# Patient Record
Sex: Female | Born: 1980
Health system: Southern US, Community
[De-identification: ages and names within clinical notes are randomized; demographics above are authoritative.]

## PROBLEM LIST (undated history)

## (undated) DIAGNOSIS — E785 Hyperlipidemia, unspecified: Secondary | ICD-10-CM

## (undated) DIAGNOSIS — E079 Disorder of thyroid, unspecified: Secondary | ICD-10-CM

## (undated) DIAGNOSIS — K219 Gastro-esophageal reflux disease without esophagitis: Secondary | ICD-10-CM

## (undated) DIAGNOSIS — N915 Oligomenorrhea, unspecified: Secondary | ICD-10-CM

## (undated) DIAGNOSIS — K5900 Constipation, unspecified: Secondary | ICD-10-CM

## (undated) DIAGNOSIS — F32A Depression, unspecified: Secondary | ICD-10-CM

## (undated) DIAGNOSIS — Z1231 Encounter for screening mammogram for malignant neoplasm of breast: Secondary | ICD-10-CM

## (undated) DIAGNOSIS — Z9221 Personal history of antineoplastic chemotherapy: Secondary | ICD-10-CM

## (undated) DIAGNOSIS — G43909 Migraine, unspecified, not intractable, without status migrainosus: Secondary | ICD-10-CM

## (undated) DIAGNOSIS — E669 Obesity, unspecified: Secondary | ICD-10-CM

## (undated) DIAGNOSIS — F419 Anxiety disorder, unspecified: Secondary | ICD-10-CM

## (undated) DIAGNOSIS — C8190 Hodgkin lymphoma, unspecified, unspecified site: Secondary | ICD-10-CM

## (undated) DIAGNOSIS — E559 Vitamin D deficiency, unspecified: Secondary | ICD-10-CM

## (undated) DIAGNOSIS — L219 Seborrheic dermatitis, unspecified: Secondary | ICD-10-CM

## (undated) DIAGNOSIS — N979 Female infertility, unspecified: Secondary | ICD-10-CM

## (undated) DIAGNOSIS — L509 Urticaria, unspecified: Secondary | ICD-10-CM

## (undated) DIAGNOSIS — C819A Hodgkin lymphoma, unspecified, in remission: Secondary | ICD-10-CM

## (undated) DIAGNOSIS — F329 Major depressive disorder, single episode, unspecified: Secondary | ICD-10-CM

## (undated) DIAGNOSIS — R252 Cramp and spasm: Secondary | ICD-10-CM

## (undated) DIAGNOSIS — S0300XA Dislocation of jaw, unspecified side, initial encounter: Secondary | ICD-10-CM

## (undated) DIAGNOSIS — R0683 Snoring: Secondary | ICD-10-CM

## (undated) DIAGNOSIS — Z923 Personal history of irradiation: Secondary | ICD-10-CM

## (undated) DIAGNOSIS — G4700 Insomnia, unspecified: Secondary | ICD-10-CM

## (undated) HISTORY — DX: Hodgkin lymphoma, unspecified, unspecified site: C81.90

## (undated) HISTORY — DX: Disorder of thyroid, unspecified: E07.9

## (undated) HISTORY — DX: Hyperlipidemia, unspecified: E78.5

## (undated) HISTORY — DX: Female infertility, unspecified: N97.9

## (undated) HISTORY — DX: Seborrheic dermatitis, unspecified: L21.9

## (undated) HISTORY — DX: Obesity, unspecified: E66.9

## (undated) HISTORY — DX: Dislocation of jaw, unspecified side, initial encounter: S03.00XA

## (undated) HISTORY — DX: Gastro-esophageal reflux disease without esophagitis: K21.9

## (undated) HISTORY — DX: Insomnia, unspecified: G47.00

## (undated) HISTORY — DX: Vitamin D deficiency, unspecified: E55.9

## (undated) HISTORY — DX: Constipation, unspecified: K59.00

## (undated) HISTORY — DX: Cramp and spasm: R25.2

## (undated) HISTORY — DX: Urticaria, unspecified: L50.9

## (undated) HISTORY — PX: THYROID SURGERY: SHX805

## (undated) HISTORY — DX: Migraine, unspecified, not intractable, without status migrainosus: G43.909

## (undated) HISTORY — DX: Depression, unspecified: F32.A

## (undated) HISTORY — DX: Anxiety disorder, unspecified: F41.9

## (undated) HISTORY — DX: Snoring: R06.83

## (undated) HISTORY — DX: Encounter for screening mammogram for malignant neoplasm of breast: Z12.31

## (undated) HISTORY — DX: Major depressive disorder, single episode, unspecified: F32.9

## (undated) HISTORY — PX: OTHER SURGICAL HISTORY: SHX169

## (undated) HISTORY — DX: Oligomenorrhea, unspecified: N91.5

## (undated) HISTORY — DX: Hodgkin lymphoma, unspecified, in remission: C81.9A

---

## 2002-02-11 HISTORY — PX: PORT-A-CATH REMOVAL: SHX5289

## 2004-03-19 ENCOUNTER — Ambulatory Visit: Payer: Self-pay | Admitting: Oncology

## 2004-04-11 ENCOUNTER — Ambulatory Visit: Payer: Self-pay | Admitting: Oncology

## 2005-04-18 ENCOUNTER — Ambulatory Visit: Payer: Self-pay | Admitting: Oncology

## 2005-05-12 ENCOUNTER — Ambulatory Visit: Payer: Self-pay | Admitting: Oncology

## 2005-08-26 ENCOUNTER — Ambulatory Visit: Payer: Self-pay | Admitting: Oncology

## 2005-09-11 ENCOUNTER — Ambulatory Visit: Payer: Self-pay | Admitting: Oncology

## 2005-09-27 ENCOUNTER — Ambulatory Visit: Payer: Self-pay | Admitting: Oncology

## 2005-10-08 ENCOUNTER — Emergency Department: Payer: Self-pay | Admitting: Emergency Medicine

## 2005-12-24 ENCOUNTER — Ambulatory Visit: Payer: Self-pay | Admitting: Oncology

## 2006-01-21 ENCOUNTER — Ambulatory Visit: Payer: Self-pay | Admitting: Oncology

## 2006-02-11 ENCOUNTER — Ambulatory Visit: Payer: Self-pay | Admitting: Oncology

## 2006-04-29 ENCOUNTER — Emergency Department: Payer: Self-pay | Admitting: Emergency Medicine

## 2006-05-07 ENCOUNTER — Emergency Department: Payer: Self-pay | Admitting: Emergency Medicine

## 2006-05-08 ENCOUNTER — Ambulatory Visit: Payer: Self-pay | Admitting: Emergency Medicine

## 2006-09-12 ENCOUNTER — Ambulatory Visit: Payer: Self-pay | Admitting: Oncology

## 2006-09-29 ENCOUNTER — Ambulatory Visit: Payer: Self-pay | Admitting: Oncology

## 2006-10-13 ENCOUNTER — Ambulatory Visit: Payer: Self-pay | Admitting: Oncology

## 2007-03-21 ENCOUNTER — Emergency Department: Payer: Self-pay | Admitting: Emergency Medicine

## 2007-06-05 ENCOUNTER — Emergency Department: Payer: Self-pay | Admitting: Emergency Medicine

## 2007-06-05 ENCOUNTER — Other Ambulatory Visit: Payer: Self-pay

## 2007-09-12 ENCOUNTER — Ambulatory Visit: Payer: Self-pay | Admitting: Oncology

## 2007-09-24 ENCOUNTER — Emergency Department: Payer: Self-pay | Admitting: Emergency Medicine

## 2008-10-18 ENCOUNTER — Ambulatory Visit: Payer: Self-pay | Admitting: Family Medicine

## 2010-03-29 ENCOUNTER — Ambulatory Visit: Payer: Self-pay | Admitting: Family Medicine

## 2010-03-30 ENCOUNTER — Ambulatory Visit: Payer: Self-pay | Admitting: Family Medicine

## 2010-08-12 HISTORY — PX: OTHER SURGICAL HISTORY: SHX169

## 2010-10-28 ENCOUNTER — Ambulatory Visit: Payer: Self-pay | Admitting: Family Medicine

## 2010-11-28 ENCOUNTER — Ambulatory Visit: Payer: Self-pay | Admitting: Family Medicine

## 2012-09-30 ENCOUNTER — Ambulatory Visit: Payer: Self-pay | Admitting: Oncology

## 2012-09-30 LAB — HCG, QUANTITATIVE, PREGNANCY: Beta Hcg, Quant.: <1 m[IU]/mL — ABNORMAL LOW

## 2012-10-06 ENCOUNTER — Ambulatory Visit: Payer: Self-pay | Admitting: Unknown Physician Specialty

## 2012-10-12 ENCOUNTER — Ambulatory Visit: Payer: Self-pay | Admitting: Oncology

## 2012-10-13 ENCOUNTER — Ambulatory Visit: Payer: Self-pay | Admitting: Unknown Physician Specialty

## 2012-10-13 DIAGNOSIS — E890 Postprocedural hypothyroidism: Secondary | ICD-10-CM | POA: Insufficient documentation

## 2012-10-13 HISTORY — PX: THYROIDECTOMY: SHX17

## 2012-10-13 LAB — CALCIUM: Calcium, Total: 8.4 mg/dL — ABNORMAL LOW (ref 8.5–10.1)

## 2012-10-14 LAB — CALCIUM: Calcium, Total: 7.8 mg/dL — ABNORMAL LOW (ref 8.5–10.1)

## 2012-11-05 LAB — PATHOLOGY REPORT

## 2012-11-11 ENCOUNTER — Ambulatory Visit: Payer: Self-pay | Admitting: Oncology

## 2012-11-19 ENCOUNTER — Ambulatory Visit (INDEPENDENT_AMBULATORY_CARE_PROVIDER_SITE_OTHER): Payer: 59 | Admitting: General Surgery

## 2012-11-19 ENCOUNTER — Telehealth: Payer: Self-pay

## 2012-11-19 ENCOUNTER — Encounter: Payer: Self-pay | Admitting: General Surgery

## 2012-11-19 VITALS — BP 134/74 | HR 80 | Resp 14 | Ht 66.0 in | Wt 273.0 lb

## 2012-11-19 DIAGNOSIS — C8190 Hodgkin lymphoma, unspecified, unspecified site: Secondary | ICD-10-CM

## 2012-11-19 NOTE — Telephone Encounter (Signed)
Patient is scheduled for a venous port placement to be done by Dr Evette Cristal and also a bone marrow biopsy to be completed by Dr Doylene Canning on 11/24/12 at 7:30 am @ [facility]. Patient given instructions and OR date. Hayley in Dr Aleda Grana office notified of OR time and date and will be contacting OR scheduling to coordinate Dr Aleda Grana biopsy to be done prior to port placement.

## 2012-11-19 NOTE — Addendum Note (Signed)
Addended by: Kieth Brightly on: 11/19/2012 05:13 PM   Modules accepted: Orders

## 2012-11-19 NOTE — Progress Notes (Signed)
Patient ID: Jo [name], female   DOB: 05/04/1980, 32 y.o.   MRN: [id number]  Chief Complaint  Patient presents with  . Pre-op Exam    port placement    HPI Jo [name] is a 32 y.o. female here today for her pr op port placement. Patient has an history of Hodgkin now with a recurrent of it again after 13 years.   HPI  Past Medical History  Diagnosis Date  . Hodgkin disease     Past Surgical History  Procedure Laterality Date  . Thyroidectomy  10-13-12  . Port-a-cath removal  2004  . Thyroid surgery      History reviewed. No pertinent family history.  Social History History  Substance Use Topics  . Smoking status: Former Smoker -- 1.00 packs/day for 2 years  . Smokeless tobacco: Never Used  . Alcohol Use: Not on file    Allergies  Allergen Reactions  . Barium-Containing Compounds Hives    Current Outpatient Prescriptions  Medication Sig Dispense Refill  . ALPRAZolam (XANAX) 0.5 MG tablet Take 0.5 mg by mouth at bedtime as needed.       . DULoxetine (CYMBALTA) 30 MG capsule Take 60 mg by mouth daily.       Marland Kitchen HYDROcodone-acetaminophen (NORCO/VICODIN) 5-325 MG per tablet Take 1 tablet by mouth every 6 (six) hours as needed.       . hydrOXYzine (ATARAX/VISTARIL) 25 MG tablet Take 25 mg by mouth every 6 (six) hours as needed.       . Riboflavin (VITAMIN B-2 PO) Take 1 tablet by mouth daily.      Marland Kitchen SYNTHROID 125 MCG tablet Take 125 mcg by mouth daily before breakfast.        No current facility-administered medications for this visit.    Review of Systems Review of Systems  Constitutional: Negative.   Respiratory: Negative.   Cardiovascular: Negative.     Blood pressure 134/74, pulse 80, resp. rate 14, height 5\' 6"  (1.676 m), weight 273 lb (123.832 kg).  Physical Exam Physical Exam  Constitutional: She is oriented to person, place, and time. She appears well-developed and well-nourished.  Eyes: Conjunctivae are normal. No scleral icterus.  Neck:     Cardiovascular: Normal rate, regular rhythm and normal heart sounds.   Pulmonary/Chest: Breath sounds normal.  Abdominal: Soft. Normal appearance and bowel sounds are normal. There is no hepatomegaly. There is no tenderness. No hernia.  Lymphadenopathy:    She has no cervical adenopathy.  Neurological: She is alert and oriented to person, place, and time.  Skin: Skin is warm and dry.    Data Reviewed Reviewed notes from Dr. Doylene Canning.   Assessment    Hodgkin's lymphoma.     Plan    Patient to have an port placement. Dr. Doylene Canning plans for bone marrow biopsy at same time.       Jo [name] 11/19/2012, 11:38 AM

## 2012-11-19 NOTE — Patient Instructions (Signed)
Implanted Port Instructions  An implanted port is a central line that has a round shape and is placed under the skin. It is used for long-term IV (intravenous) access for:  · Medicine.  · Fluids.  · Liquid nutrition, such as TPN (total parenteral nutrition).  · Blood samples.  Ports can be placed:  · In the chest area just below the collarbone (this is the most common place.)  · In the arms.  · In the belly (abdomen) area.  · In the legs.  PARTS OF THE PORT  A port has 2 main parts:  · The reservoir. The reservoir is round, disc-shaped, and will be a small, raised area under your skin.  · The reservoir is the part where a needle is inserted (accessed) to either give medicines or to draw blood.  · The catheter. The catheter is a long, slender tube that extends from the reservoir. The catheter is placed into a large vein.  · Medicine that is inserted into the reservoir goes into the catheter and then into the vein.  INSERTION OF THE PORT  · The port is surgically placed in either an operating room or in a procedural area (interventional radiology).  · Medicine may be given to help you relax during the procedure.  · The skin where the port will be inserted is numbed (local anesthetic).  · 1 or 2 small cuts (incisions) will be made in the skin to insert the port.  · The port can be used after it has been inserted.  INCISION SITE CARE  · The incision site may have small adhesive strips on it. This helps keep the incision site closed. Sometimes, no adhesive strips are placed. Instead of adhesive strips, a special kind of surgical glue is used to keep the incision closed.  · If adhesive strips were placed on the incision sites, do not take them off. They will fall off on their own.  · The incision site may be sore for 1 to 2 days. Pain medicine can help.  · Do not get the incision site wet. Bathe or shower as directed by your caregiver.  · The incision site should heal in 5 to 7 days. A small scar may form after the  incision has healed.  ACCESSING THE PORT  Special steps must be taken to access the port:  · Before the port is accessed, a numbing cream can be placed on the skin. This helps numb the skin over the port site.  · A sterile technique is used to access the port.  · The port is accessed with a needle. Only "non-coring" port needles should be used to access the port. Once the port is accessed, a blood return should be checked. This helps ensure the port is in the vein and is not clogged (clotted).  · If your caregiver believes your port should remain accessed, a clear (transparent) bandage will be placed over the needle site. The bandage and needle will need to be changed every week or as directed by your caregiver.  · Keep the bandage covering the needle clean and dry. Do not get it wet. Follow your caregiver's instructions on how to take a shower or bath when the port is accessed.  · If your port does not need to stay accessed, no bandage is needed over the port.  FLUSHING THE PORT  Flushing the port keeps it from getting clogged. How often the port is flushed depends on:  · If a   constant infusion is running. If a constant infusion is running, the port may not need to be flushed.  · If intermittent medicines are given.  · If the port is not being used.  For intermittent medicines:  · The port will need to be flushed:  · After medicines have been given.  · After blood has been drawn.  · As part of routine maintenance.  · A port is normally flushed with:  · Normal saline.  · Heparin.  · Follow your caregiver's advice on how often, how much, and the type of flush to use on your port.  IMPORTANT PORT INFORMATION  · Tell your caregiver if you are allergic to heparin.  · After your port is placed, you will get a manufacturer's information card. The card has information about your port. Keep this card with you at all times.  · There are many types of ports available. Know what kind of port you have.  · In case of an  emergency, it may be helpful to wear a medical alert bracelet. This can help alert health care workers that you have a port.  · The port can stay in for as long as your caregiver believes it is necessary.  · When it is time for the port to come out, surgery will be done to remove it. The surgery will be similar to how the port was put in.  · If you are in the hospital or clinic:  · Your port will be taken care of and flushed by a nurse.  · If you are at home:  · A home health care nurse may give medicines and take care of the port.  · You or a family member can get special training and directions for giving medicine and taking care of the port at home.  SEEK IMMEDIATE MEDICAL CARE IF:   · Your port does not flush or you are unable to get a blood return.  · New drainage or pus is coming from the incision.  · A bad smell is coming from the incision site.  · You develop swelling or increased redness at the incision site.  · You develop increased swelling or pain at the port site.  · You develop swelling or pain in the surrounding skin near the port.  · You have an oral temperature above 102° F (38.9° C), not controlled by medicine.  MAKE SURE YOU:   · Understand these instructions.  · Will watch your condition.  · Will get help right away if you are not doing well or get worse.  Document Released: 01/28/2005 Document Revised: 04/22/2011 Document Reviewed: 04/21/2008  [employer]® Patient Information ©2014 [employer].

## 2012-11-24 ENCOUNTER — Ambulatory Visit: Payer: Self-pay | Admitting: Oncology

## 2012-11-24 ENCOUNTER — Encounter: Payer: Self-pay | Admitting: General Surgery

## 2012-11-24 DIAGNOSIS — C8190 Hodgkin lymphoma, unspecified, unspecified site: Secondary | ICD-10-CM

## 2012-11-24 HISTORY — PX: PORTACATH PLACEMENT: SHX2246

## 2012-11-24 LAB — CBC WITH DIFFERENTIAL/PLATELET
Basophil: 1 %
Eosinophil: 1 %
HCT: 38.7 % (ref 35.0–47.0)
HGB: 13.1 g/dL (ref 12.0–16.0)
Lymphocytes: 25 %
MCH: 26.9 pg (ref 26.0–34.0)
MCHC: 34 g/dL (ref 32.0–36.0)
MCV: 79 fL — ABNORMAL LOW (ref 80–100)
Platelet: 327 10*3/uL (ref 150–440)
RBC: 4.88 10*6/uL (ref 3.80–5.20)
WBC: 13.4 10*3/uL — ABNORMAL HIGH (ref 3.6–11.0)

## 2012-11-25 ENCOUNTER — Encounter: Payer: Self-pay | Admitting: General Surgery

## 2012-12-01 LAB — CBC CANCER CENTER
Basophil %: 1.1 %
Eosinophil #: 0.4 x10 3/mm (ref 0.0–0.7)
Eosinophil %: 3 %
HCT: 38.2 % (ref 35.0–47.0)
Lymphocyte %: 17.9 %
MCH: 27 pg (ref 26.0–34.0)
MCV: 81 fL (ref 80–100)
Monocyte #: 0.6 x10 3/mm (ref 0.2–0.9)
Monocyte %: 5.4 %
Neutrophil #: 8.7 x10 3/mm — ABNORMAL HIGH (ref 1.4–6.5)
Platelet: 343 x10 3/mm (ref 150–440)
RBC: 4.74 10*6/uL (ref 3.80–5.20)
RDW: 13.6 % (ref 11.5–14.5)
WBC: 12 x10 3/mm — ABNORMAL HIGH (ref 3.6–11.0)

## 2012-12-01 LAB — COMPREHENSIVE METABOLIC PANEL
Alkaline Phosphatase: 73 U/L (ref 50–136)
Anion Gap: 10 (ref 7–16)
BUN: 15 mg/dL (ref 7–18)
Bilirubin,Total: 0.4 mg/dL (ref 0.2–1.0)
Calcium, Total: 6.7 mg/dL — CL (ref 8.5–10.1)
Chloride: 98 mmol/L (ref 98–107)
Co2: 30 mmol/L (ref 21–32)
Creatinine: 0.87 mg/dL (ref 0.60–1.30)
EGFR (Non-African Amer.): >60
Glucose: 129 mg/dL — ABNORMAL HIGH (ref 65–99)
Osmolality: 278 (ref 275–301)
Potassium: 3.4 mmol/L — ABNORMAL LOW (ref 3.5–5.1)
Total Protein: 7.7 g/dL (ref 6.4–8.2)

## 2012-12-03 ENCOUNTER — Encounter: Payer: Self-pay | Admitting: General Surgery

## 2012-12-03 ENCOUNTER — Ambulatory Visit (INDEPENDENT_AMBULATORY_CARE_PROVIDER_SITE_OTHER): Payer: 59 | Admitting: General Surgery

## 2012-12-03 VITALS — BP 126/76 | HR 80 | Resp 16 | Ht 66.0 in | Wt 272.0 lb

## 2012-12-03 DIAGNOSIS — C8190 Hodgkin lymphoma, unspecified, unspecified site: Secondary | ICD-10-CM

## 2012-12-03 NOTE — Patient Instructions (Signed)
The patient is aware to call back for any questions or concerns.  

## 2012-12-03 NOTE — Progress Notes (Signed)
Patient here today for postoperative visit she had a venous port placed 11-24-12.   She is doing well and chemotherapy has started. Port site is clean left upper chest wall. No signs of infection. Lungs clear. Advised to call for any concerns

## 2012-12-08 LAB — CBC CANCER CENTER
Eosinophil #: 0.3 x10 3/mm (ref 0.0–0.7)
HGB: 11.3 g/dL — ABNORMAL LOW (ref 12.0–16.0)
Lymphocyte %: 36.8 %
MCH: 26.4 pg (ref 26.0–34.0)
MCHC: 32.4 g/dL (ref 32.0–36.0)
MCV: 82 fL (ref 80–100)
Monocyte %: 3.2 %
RBC: 4.26 10*6/uL (ref 3.80–5.20)
RDW: 13.5 % (ref 11.5–14.5)
WBC: 5.9 x10 3/mm (ref 3.6–11.0)

## 2012-12-12 ENCOUNTER — Ambulatory Visit: Payer: Self-pay | Admitting: Oncology

## 2012-12-15 LAB — CBC CANCER CENTER
Eosinophil #: 0.1 x10 3/mm (ref 0.0–0.7)
HCT: 38.8 % (ref 35.0–47.0)
HGB: 12.3 g/dL (ref 12.0–16.0)
Lymphocyte #: 3.7 x10 3/mm — ABNORMAL HIGH (ref 1.0–3.6)
MCH: 26.2 pg (ref 26.0–34.0)
MCHC: 31.8 g/dL — ABNORMAL LOW (ref 32.0–36.0)
MCV: 82 fL (ref 80–100)
Neutrophil #: 36.6 x10 3/mm — ABNORMAL HIGH (ref 1.4–6.5)
Platelet: 233 x10 3/mm (ref 150–440)
RBC: 4.71 10*6/uL (ref 3.80–5.20)
WBC: 42 x10 3/mm — ABNORMAL HIGH (ref 3.6–11.0)

## 2012-12-22 LAB — COMPREHENSIVE METABOLIC PANEL
Alkaline Phosphatase: 95 U/L (ref 50–136)
Anion Gap: 11 (ref 7–16)
Bilirubin,Total: 0.2 mg/dL (ref 0.2–1.0)
Co2: 30 mmol/L (ref 21–32)
EGFR (Non-African Amer.): >60
Glucose: 106 mg/dL — ABNORMAL HIGH (ref 65–99)
Osmolality: 276 (ref 275–301)
Potassium: 3.8 mmol/L (ref 3.5–5.1)
SGOT(AST): 21 U/L (ref 15–37)
SGPT (ALT): 40 U/L (ref 12–78)
Total Protein: 7.3 g/dL (ref 6.4–8.2)

## 2012-12-22 LAB — CBC CANCER CENTER
Basophil #: 0.2 x10 3/mm — ABNORMAL HIGH (ref 0.0–0.1)
HCT: 38 % (ref 35.0–47.0)
HGB: 12.3 g/dL (ref 12.0–16.0)
Lymphocyte #: 1.9 x10 3/mm (ref 1.0–3.6)
Lymphocyte %: 12.3 %
MCH: 26.4 pg (ref 26.0–34.0)
Monocyte #: 1.5 x10 3/mm — ABNORMAL HIGH (ref 0.2–0.9)
Neutrophil %: 75.1 %
Platelet: 287 x10 3/mm (ref 150–440)
RBC: 4.67 10*6/uL (ref 3.80–5.20)

## 2013-01-06 LAB — COMPREHENSIVE METABOLIC PANEL
Alkaline Phosphatase: 102 U/L
Bilirubin,Total: 0.1 mg/dL — ABNORMAL LOW (ref 0.2–1.0)
Calcium, Total: 6.7 mg/dL — CL (ref 8.5–10.1)
Creatinine: 0.91 mg/dL (ref 0.60–1.30)
EGFR (African American): >60
EGFR (Non-African Amer.): >60
Sodium: 141 mmol/L (ref 136–145)

## 2013-01-06 LAB — CBC CANCER CENTER
HGB: 11.5 g/dL — ABNORMAL LOW (ref 12.0–16.0)
MCH: 26.3 pg (ref 26.0–34.0)
MCV: 82 fL (ref 80–100)
Metamyelocyte: 8 %
NRBC/100 WBC: 2 /100
RBC: 4.36 10*6/uL (ref 3.80–5.20)
RDW: 14.9 % — ABNORMAL HIGH (ref 11.5–14.5)
WBC: 37.3 x10 3/mm — ABNORMAL HIGH (ref 3.6–11.0)

## 2013-01-11 ENCOUNTER — Ambulatory Visit: Payer: Self-pay | Admitting: Oncology

## 2013-01-26 ENCOUNTER — Ambulatory Visit: Payer: Self-pay | Admitting: Oncology

## 2013-02-11 ENCOUNTER — Ambulatory Visit: Payer: Self-pay | Admitting: Oncology

## 2013-03-01 LAB — CBC CANCER CENTER
BASOS PCT: 1.2 %
Basophil #: 0.1 x10 3/mm (ref 0.0–0.1)
EOS PCT: 7.8 %
Eosinophil #: 0.7 x10 3/mm (ref 0.0–0.7)
HCT: 34.9 % — ABNORMAL LOW (ref 35.0–47.0)
HGB: 11.5 g/dL — ABNORMAL LOW (ref 12.0–16.0)
Lymphocyte #: 1.3 x10 3/mm (ref 1.0–3.6)
Lymphocyte %: 13.8 %
MCH: 26.8 pg (ref 26.0–34.0)
MCHC: 33 g/dL (ref 32.0–36.0)
MCV: 81 fL (ref 80–100)
MONO ABS: 0.5 x10 3/mm (ref 0.2–0.9)
Monocyte %: 5.8 %
NEUTROS ABS: 6.6 x10 3/mm — AB (ref 1.4–6.5)
NEUTROS PCT: 71.4 %
Platelet: 276 x10 3/mm (ref 150–440)
RBC: 4.3 10*6/uL (ref 3.80–5.20)
RDW: 16.4 % — ABNORMAL HIGH (ref 11.5–14.5)
WBC: 9.2 x10 3/mm (ref 3.6–11.0)

## 2013-03-14 ENCOUNTER — Ambulatory Visit: Payer: Self-pay | Admitting: Oncology

## 2013-03-15 LAB — CBC CANCER CENTER
BASOS ABS: 0.2 x10 3/mm — AB (ref 0.0–0.1)
Basophil %: 1.8 %
EOS ABS: 0.6 x10 3/mm (ref 0.0–0.7)
EOS PCT: 6.6 %
HCT: 35.6 % (ref 35.0–47.0)
HGB: 11.6 g/dL — ABNORMAL LOW (ref 12.0–16.0)
LYMPHS ABS: 1.4 x10 3/mm (ref 1.0–3.6)
LYMPHS PCT: 14.5 %
MCH: 26.5 pg (ref 26.0–34.0)
MCHC: 32.5 g/dL (ref 32.0–36.0)
MCV: 81 fL (ref 80–100)
MONO ABS: 0.9 x10 3/mm (ref 0.2–0.9)
MONOS PCT: 9.9 %
NEUTROS PCT: 67.2 %
Neutrophil #: 6.4 x10 3/mm (ref 1.4–6.5)
Platelet: 297 x10 3/mm (ref 150–440)
RBC: 4.37 10*6/uL (ref 3.80–5.20)
RDW: 16.2 % — AB (ref 11.5–14.5)
WBC: 9.5 x10 3/mm (ref 3.6–11.0)

## 2013-03-19 LAB — CBC CANCER CENTER
BASOS ABS: 0.2 x10 3/mm — AB (ref 0.0–0.1)
Basophil %: 1.3 %
Eosinophil #: 0.5 x10 3/mm (ref 0.0–0.7)
Eosinophil %: 4.2 %
HCT: 37.2 % (ref 35.0–47.0)
HGB: 11.9 g/dL — ABNORMAL LOW (ref 12.0–16.0)
LYMPHS ABS: 1.1 x10 3/mm (ref 1.0–3.6)
Lymphocyte %: 8.9 %
MCH: 25.9 pg — AB (ref 26.0–34.0)
MCHC: 31.9 g/dL — ABNORMAL LOW (ref 32.0–36.0)
MCV: 81 fL (ref 80–100)
MONO ABS: 1 x10 3/mm — AB (ref 0.2–0.9)
Monocyte %: 8.1 %
Neutrophil #: 9.4 x10 3/mm — ABNORMAL HIGH (ref 1.4–6.5)
Neutrophil %: 77.5 %
Platelet: 317 x10 3/mm (ref 150–440)
RBC: 4.58 10*6/uL (ref 3.80–5.20)
RDW: 15.8 % — ABNORMAL HIGH (ref 11.5–14.5)
WBC: 12.1 x10 3/mm — ABNORMAL HIGH (ref 3.6–11.0)

## 2013-03-19 LAB — COMPREHENSIVE METABOLIC PANEL
ALK PHOS: 61 U/L
Albumin: 3.7 g/dL (ref 3.4–5.0)
Anion Gap: 11 (ref 7–16)
BUN: 14 mg/dL (ref 7–18)
Bilirubin,Total: 0.2 mg/dL (ref 0.2–1.0)
CHLORIDE: 98 mmol/L (ref 98–107)
Calcium, Total: 7.1 mg/dL — ABNORMAL LOW (ref 8.5–10.1)
Co2: 28 mmol/L (ref 21–32)
Creatinine: 0.81 mg/dL (ref 0.60–1.30)
GLUCOSE: 92 mg/dL (ref 65–99)
OSMOLALITY: 274 (ref 275–301)
Potassium: 3.7 mmol/L (ref 3.5–5.1)
SGOT(AST): 16 U/L (ref 15–37)
SGPT (ALT): 19 U/L (ref 12–78)
Sodium: 137 mmol/L (ref 136–145)
Total Protein: 7.7 g/dL (ref 6.4–8.2)

## 2013-03-19 LAB — RAPID INFLUENZA A&B ANTIGENS

## 2013-03-24 LAB — CULTURE, BLOOD (SINGLE)

## 2013-04-01 LAB — CBC CANCER CENTER
BASOS ABS: 0.1 x10 3/mm (ref 0.0–0.1)
BASOS PCT: 1.3 %
Eosinophil #: 0.4 x10 3/mm (ref 0.0–0.7)
Eosinophil %: 4.3 %
HCT: 37.5 % (ref 35.0–47.0)
HGB: 12.1 g/dL (ref 12.0–16.0)
LYMPHS PCT: 12.8 %
Lymphocyte #: 1.1 x10 3/mm (ref 1.0–3.6)
MCH: 25.9 pg — ABNORMAL LOW (ref 26.0–34.0)
MCHC: 32.3 g/dL (ref 32.0–36.0)
MCV: 80 fL (ref 80–100)
Monocyte #: 0.7 x10 3/mm (ref 0.2–0.9)
Monocyte %: 8.8 %
NEUTROS ABS: 6.1 x10 3/mm (ref 1.4–6.5)
NEUTROS PCT: 72.8 %
Platelet: 326 x10 3/mm (ref 150–440)
RBC: 4.69 10*6/uL (ref 3.80–5.20)
RDW: 14.8 % — ABNORMAL HIGH (ref 11.5–14.5)
WBC: 8.3 x10 3/mm (ref 3.6–11.0)

## 2013-04-01 LAB — COMPREHENSIVE METABOLIC PANEL
ALBUMIN: 3.7 g/dL (ref 3.4–5.0)
ALT: 23 U/L (ref 12–78)
ANION GAP: 11 (ref 7–16)
Alkaline Phosphatase: 58 U/L
BUN: 15 mg/dL (ref 7–18)
Bilirubin,Total: 0.1 mg/dL — ABNORMAL LOW (ref 0.2–1.0)
CHLORIDE: 98 mmol/L (ref 98–107)
CREATININE: 0.85 mg/dL (ref 0.60–1.30)
Calcium, Total: 6.8 mg/dL — CL (ref 8.5–10.1)
Co2: 29 mmol/L (ref 21–32)
EGFR (African American): >60
EGFR (Non-African Amer.): >60
GLUCOSE: 89 mg/dL (ref 65–99)
Osmolality: 276 (ref 275–301)
Potassium: 3.7 mmol/L (ref 3.5–5.1)
SGOT(AST): 18 U/L (ref 15–37)
Sodium: 138 mmol/L (ref 136–145)
TOTAL PROTEIN: 7.6 g/dL (ref 6.4–8.2)

## 2013-04-01 LAB — LACTATE DEHYDROGENASE: LDH: 182 U/L (ref 81–246)

## 2013-04-11 ENCOUNTER — Ambulatory Visit: Payer: Self-pay | Admitting: Oncology

## 2013-05-06 ENCOUNTER — Ambulatory Visit: Payer: Self-pay | Admitting: Oncology

## 2013-05-10 LAB — COMPREHENSIVE METABOLIC PANEL
ALT: 21 U/L (ref 12–78)
AST: 18 U/L (ref 15–37)
Albumin: 3.5 g/dL (ref 3.4–5.0)
Alkaline Phosphatase: 60 U/L
Anion Gap: 9 (ref 7–16)
BUN: 16 mg/dL (ref 7–18)
Bilirubin,Total: 0.2 mg/dL (ref 0.2–1.0)
CALCIUM: 7.2 mg/dL — AB (ref 8.5–10.1)
Chloride: 98 mmol/L (ref 98–107)
Co2: 30 mmol/L (ref 21–32)
Creatinine: 0.71 mg/dL (ref 0.60–1.30)
EGFR (African American): >60
EGFR (Non-African Amer.): >60
Glucose: 90 mg/dL (ref 65–99)
OSMOLALITY: 275 (ref 275–301)
Potassium: 3.3 mmol/L — ABNORMAL LOW (ref 3.5–5.1)
Sodium: 137 mmol/L (ref 136–145)
Total Protein: 7.6 g/dL (ref 6.4–8.2)

## 2013-05-10 LAB — CBC CANCER CENTER
BASOS ABS: 0.1 x10 3/mm (ref 0.0–0.1)
Basophil %: 1 %
EOS PCT: 3.7 %
Eosinophil #: 0.4 x10 3/mm (ref 0.0–0.7)
HCT: 35.3 % (ref 35.0–47.0)
HGB: 11.1 g/dL — ABNORMAL LOW (ref 12.0–16.0)
Lymphocyte #: 1.3 x10 3/mm (ref 1.0–3.6)
Lymphocyte %: 13.7 %
MCH: 24.4 pg — ABNORMAL LOW (ref 26.0–34.0)
MCHC: 31.5 g/dL — ABNORMAL LOW (ref 32.0–36.0)
MCV: 77 fL — ABNORMAL LOW (ref 80–100)
MONOS PCT: 8.8 %
Monocyte #: 0.8 x10 3/mm (ref 0.2–0.9)
NEUTROS ABS: 6.9 x10 3/mm — AB (ref 1.4–6.5)
Neutrophil %: 72.8 %
Platelet: 285 x10 3/mm (ref 150–440)
RBC: 4.57 10*6/uL (ref 3.80–5.20)
RDW: 14.9 % — AB (ref 11.5–14.5)
WBC: 9.5 x10 3/mm (ref 3.6–11.0)

## 2013-05-10 LAB — SEDIMENTATION RATE: ERYTHROCYTE SED RATE: 70 mm/h — AB (ref 0–20)

## 2013-05-12 ENCOUNTER — Ambulatory Visit: Payer: Self-pay | Admitting: Oncology

## 2013-06-03 LAB — HM PAP SMEAR: HM Pap smear: NORMAL

## 2013-07-06 ENCOUNTER — Ambulatory Visit: Payer: Self-pay | Admitting: Family Medicine

## 2013-07-06 LAB — HM MAMMOGRAPHY: HM Mammogram: NORMAL

## 2013-07-21 ENCOUNTER — Ambulatory Visit: Payer: Self-pay | Admitting: Oncology

## 2013-09-07 ENCOUNTER — Ambulatory Visit: Payer: Self-pay | Admitting: Oncology

## 2013-09-07 LAB — CBC CANCER CENTER
Basophil #: 0.1 x10 3/mm (ref 0.0–0.1)
Basophil %: 0.7 %
EOS ABS: 0.2 x10 3/mm (ref 0.0–0.7)
Eosinophil %: 2.2 %
HCT: 33.7 % — AB (ref 35.0–47.0)
HGB: 10.7 g/dL — ABNORMAL LOW (ref 12.0–16.0)
Lymphocyte #: 1.5 x10 3/mm (ref 1.0–3.6)
Lymphocyte %: 15 %
MCH: 24.3 pg — ABNORMAL LOW (ref 26.0–34.0)
MCHC: 31.7 g/dL — AB (ref 32.0–36.0)
MCV: 77 fL — ABNORMAL LOW (ref 80–100)
Monocyte #: 0.7 x10 3/mm (ref 0.2–0.9)
Monocyte %: 7 %
Neutrophil #: 7.3 x10 3/mm — ABNORMAL HIGH (ref 1.4–6.5)
Neutrophil %: 75.1 %
Platelet: 304 x10 3/mm (ref 150–440)
RBC: 4.41 10*6/uL (ref 3.80–5.20)
RDW: 16.3 % — ABNORMAL HIGH (ref 11.5–14.5)
WBC: 9.7 x10 3/mm (ref 3.6–11.0)

## 2013-09-07 LAB — COMPREHENSIVE METABOLIC PANEL
ALK PHOS: 60 U/L
ANION GAP: 10 (ref 7–16)
Albumin: 3.4 g/dL (ref 3.4–5.0)
BUN: 16 mg/dL (ref 7–18)
Bilirubin,Total: 0.2 mg/dL (ref 0.2–1.0)
Calcium, Total: 7.3 mg/dL — ABNORMAL LOW (ref 8.5–10.1)
Chloride: 100 mmol/L (ref 98–107)
Co2: 27 mmol/L (ref 21–32)
Creatinine: 0.64 mg/dL (ref 0.60–1.30)
EGFR (African American): >60
EGFR (Non-African Amer.): >60
GLUCOSE: 104 mg/dL — AB (ref 65–99)
OSMOLALITY: 275 (ref 275–301)
POTASSIUM: 3.3 mmol/L — AB (ref 3.5–5.1)
SGOT(AST): 16 U/L (ref 15–37)
SGPT (ALT): 22 U/L
SODIUM: 137 mmol/L (ref 136–145)
Total Protein: 7.3 g/dL (ref 6.4–8.2)

## 2013-09-07 LAB — LACTATE DEHYDROGENASE: LDH: 159 U/L (ref 81–246)

## 2013-09-11 ENCOUNTER — Ambulatory Visit: Payer: Self-pay | Admitting: Oncology

## 2013-11-11 ENCOUNTER — Ambulatory Visit: Payer: Self-pay | Admitting: Oncology

## 2013-11-15 ENCOUNTER — Ambulatory Visit: Payer: Self-pay | Admitting: Oncology

## 2013-11-15 LAB — CBC CANCER CENTER
Basophil #: 0.1 x10 3/mm (ref 0.0–0.1)
Basophil %: 1.3 %
Eosinophil #: 0.3 x10 3/mm (ref 0.0–0.7)
Eosinophil %: 2.4 %
HCT: 36.5 % (ref 35.0–47.0)
HGB: 11.8 g/dL — ABNORMAL LOW (ref 12.0–16.0)
LYMPHS ABS: 1.8 x10 3/mm (ref 1.0–3.6)
Lymphocyte %: 16.8 %
MCH: 24.6 pg — AB (ref 26.0–34.0)
MCHC: 32.4 g/dL (ref 32.0–36.0)
MCV: 76 fL — AB (ref 80–100)
Monocyte #: 0.9 x10 3/mm (ref 0.2–0.9)
Monocyte %: 8.5 %
Neutrophil #: 7.7 x10 3/mm — ABNORMAL HIGH (ref 1.4–6.5)
Neutrophil %: 71 %
Platelet: 315 x10 3/mm (ref 150–440)
RBC: 4.81 10*6/uL (ref 3.80–5.20)
RDW: 15.7 % — ABNORMAL HIGH (ref 11.5–14.5)
WBC: 10.9 x10 3/mm (ref 3.6–11.0)

## 2013-11-15 LAB — COMPREHENSIVE METABOLIC PANEL
ALT: 28 U/L
ANION GAP: 8 (ref 7–16)
Albumin: 3.6 g/dL (ref 3.4–5.0)
Alkaline Phosphatase: 65 U/L
BUN: 18 mg/dL (ref 7–18)
Bilirubin,Total: 0.1 mg/dL — ABNORMAL LOW (ref 0.2–1.0)
CHLORIDE: 100 mmol/L (ref 98–107)
CO2: 30 mmol/L (ref 21–32)
CREATININE: 0.72 mg/dL (ref 0.60–1.30)
Calcium, Total: 7.9 mg/dL — ABNORMAL LOW (ref 8.5–10.1)
EGFR (Non-African Amer.): >60
Glucose: 107 mg/dL — ABNORMAL HIGH (ref 65–99)
Osmolality: 278 (ref 275–301)
Potassium: 3.5 mmol/L (ref 3.5–5.1)
SGOT(AST): 19 U/L (ref 15–37)
Sodium: 138 mmol/L (ref 136–145)
TOTAL PROTEIN: 7.2 g/dL (ref 6.4–8.2)

## 2013-11-15 LAB — LACTATE DEHYDROGENASE: LDH: 152 U/L (ref 81–246)

## 2013-11-25 LAB — HEMOGLOBIN A1C: HEMOGLOBIN A1C: 6 % (ref 4.0–6.0)

## 2013-12-12 ENCOUNTER — Ambulatory Visit: Payer: Self-pay | Admitting: Oncology

## 2014-01-11 ENCOUNTER — Ambulatory Visit: Payer: Self-pay | Admitting: Oncology

## 2014-05-16 ENCOUNTER — Ambulatory Visit
Admit: 2014-05-16 | Disposition: A | Discharge status: Home / Self Care | Payer: Self-pay | Attending: Oncology | Admitting: Oncology

## 2014-05-16 LAB — COMPREHENSIVE METABOLIC PANEL
Albumin: 4 g/dL
Alkaline Phosphatase: 60 U/L
Anion Gap: 7 (ref 7–16)
BUN: 15 mg/dL
Bilirubin,Total: 0.2 mg/dL — ABNORMAL LOW
CREATININE: 0.68 mg/dL
Calcium, Total: 7.6 mg/dL — ABNORMAL LOW
Chloride: 98 mmol/L — ABNORMAL LOW
Co2: 29 mmol/L
Glucose: 107 mg/dL — ABNORMAL HIGH
POTASSIUM: 3.8 mmol/L
SGOT(AST): 19 U/L
SGPT (ALT): 17 U/L
Sodium: 134 mmol/L — ABNORMAL LOW
TOTAL PROTEIN: 7.5 g/dL

## 2014-05-16 LAB — CBC CANCER CENTER
Basophil #: 0.1 x10 3/mm (ref 0.0–0.1)
Basophil %: 0.9 %
EOS PCT: 3.7 %
Eosinophil #: 0.4 x10 3/mm (ref 0.0–0.7)
HCT: 36.4 % (ref 35.0–47.0)
HGB: 11.9 g/dL — ABNORMAL LOW (ref 12.0–16.0)
Lymphocyte #: 1.8 x10 3/mm (ref 1.0–3.6)
Lymphocyte %: 18.1 %
MCH: 24.7 pg — ABNORMAL LOW (ref 26.0–34.0)
MCHC: 32.6 g/dL (ref 32.0–36.0)
MCV: 76 fL — ABNORMAL LOW (ref 80–100)
MONOS PCT: 7.2 %
Monocyte #: 0.7 x10 3/mm (ref 0.2–0.9)
NEUTROS PCT: 70.1 %
Neutrophil #: 7 x10 3/mm — ABNORMAL HIGH (ref 1.4–6.5)
PLATELETS: 336 x10 3/mm (ref 150–440)
RBC: 4.81 10*6/uL (ref 3.80–5.20)
RDW: 14.9 % — ABNORMAL HIGH (ref 11.5–14.5)
WBC: 10 x10 3/mm (ref 3.6–11.0)

## 2014-05-16 LAB — LACTATE DEHYDROGENASE: LDH: 144 U/L

## 2014-05-27 ENCOUNTER — Other Ambulatory Visit: Payer: Self-pay | Admitting: Oncology

## 2014-05-27 DIAGNOSIS — C8190 Hodgkin lymphoma, unspecified, unspecified site: Secondary | ICD-10-CM

## 2014-05-27 DIAGNOSIS — Z1231 Encounter for screening mammogram for malignant neoplasm of breast: Secondary | ICD-10-CM

## 2014-06-03 NOTE — Op Note (Signed)
PATIENT NAME:  [name], Jo N MR#:  [id number] DATE OF BIRTH:  05/19/1980  DATE OF PROCEDURE:  11/24/2012  PREOPERATIVE DIAGNOSIS: Recurrent Hodgkin's lymphoma.   POSTOPERATIVE DIAGNOSIS: Recurrent Hodgkin's lymphoma.   OPERATION PERFORMED: Insertion of venous access port.   SURGEON: S. G. Sankar, M.D.   ANESTHESIA: General.   COMPLICATIONS: None.   ESTIMATED BLOOD LOSS: Minimal.   DRAINS: None.   DESCRIPTION OF PROCEDURE: The patient was initially put to sleep with endotracheal tube and placed in the prone position for performance of a bone marrow biopsy done by the bone marrow team. After this was performed, the patient was placed back in the supine position in slight Trendelenburg. The left upper chest and neck area were prepped and draped out as a sterile field. Timeout procedure was again performed. Ultrasound probe was brought up to the field, and the subclavian vein underneath the lateral end of the clavicle was identified and a small stab incision was made. Through this, a needle was positioned into the subclavian vein with ultrasound guidance successfully with free withdrawal of blood. Following this, a Seldinger technique was utilized, and the catheter was positioned going into the superior vena cava with a skin marking at about the 24 to 25 cm level at the entrance site. The subcutaneous pocket was created over the second costal cartilage at a premarked incision site. The incision was made, and subcutaneous pocket was created. The catheter was tunneled through to this site and cut to approximate length and fixed to a prefilled port. Three stitches of 3-0 Prolene were utilized to anchor the port to the underlying fascia in the subcutaneous pocket, and the port was then flushed with heparinized saline. The catheter was smoothed out at the entrance site. Fluoroscopy was performed to ensure that the catheter was still in good position in the distal superior vena cava. The subcutaneous  tissue was then closed with 3-0 Vicryl, and the skin incision was closed with subcuticular 4-0 Vicryl and covered with Dermabond. The procedure was well tolerated. The patient subsequently was extubated and returned to the recovery room in stable condition.   ____________________________ S.G. Sankar, MD sgs:gb D: 11/24/2012 16:28:24 ET T: 11/24/2012 17:44:20 ET JOB#: [id number]  cc: S.G. Sankar, MD, <Dictator> SEEPLAPUTH G SANKAR MD ELECTRONICALLY SIGNED 11/26/2012 8:14 

## 2014-06-03 NOTE — Consult Note (Signed)
Reason for Visit: This 34 year old Female patient presents to the clinic for initial evaluation of  recurrent Hodgkin's disease .   Referred by Dr. Oliva Bustard.  Diagnosis:  Chief Complaint/Diagnosis   34 year old female status post chemotherapy and radiation therapy 13 years prior for Hodgkin's disease now with recurrent disease in the neck PET positive in nasopharynx and right cervical chain status post 2 cycles ABVD with complete response by PET/CT now for involved field radiation  Pathology Report pathology reports reviewed   Imaging Report serial CT scans and PET CT scan was reviewed   Referral Report clinical notes reviewed   Planned Treatment Regimen IMRT radiation therapy to involved field head and neck   HPI   patient is a 34 year old female well known to our Department having been treated with modified mantle field 13 years prior for Hodgkin's disease. She presented with multinodular goiter and high cervical palpable lymphadenopathy in the right neck.she underwent total thyroidectomy and excision a right deep cervical lymph node. Endoscopic biopsy of nasopharyngeal mass so showed adenoid hypertrophy.initial diagnosis of right cervical node after reviewing [facility] was recurrent Hodgkin's disease. Case was presented at the joint [facility] tumor conference and recommendation for repeat chemotherapy as well as involved field radiation therapy was made. She is undergone ABVD 13 years prior now was treated withBEACOPPstarted cover 21st 2000 Fortini she's had 2 cycles. She had a repeat PET/CT scan showing complete resolution of hypermetabolic activity in the head and neck region. She is doing well. She specifically denies dysphasia cough fever chills weight loss any head and neck pain or dysphagia. She is now referred [facility] for involved field radiation.  Past Hx:      gerd:    hypothyroid:    anxiety:    Hodgkin's Disease:    Denies surgical history.:   Past, Family and Social  History:  Past Medical History positive   Gastrointestinal GERD   Endocrine hypothyroidism   Neurological/Psychiatric anxiety   Past Surgical History total thyroidectomy and neck dissection as described above   Past Medical History Comments heavy menses   Family History noncontributory   Social History noncontributory   Additional Past Medical and Surgical History accompanied by husband today   Allergies:   Barium Sulfate: GI Distress, Headaches, N/V/Diarrhea  MSG: Unknown  Home Meds:  Home Medications:  Medication Instructions Status  acetaminophen-HYDROcodone 300 mg-5 mg oral tablet 1 tab(s) orally every 6 hours, As Needed - for Pain Active  ALPRAZolam 0.5 mg oral tablet 1 tab(s) orally 3 times a day, As Needed Active  Zofran 4 mg oral tablet 1 tab(s) orally every 6 hours PRN for chemotherapy induced nausea and vomiting  Active  PriLOSEC OTC 20 mg oral delayed release tablet 1 tab(s) orally once a day (in the morning) Active  Synthroid 125 mcg (0.125 mg) oral tablet 1 tab(s) orally once a day (in the morning) Active  hydrOXYzine hydrochloride 25 mg oral tablet 1 tab(s) orally 4 times a day, As Needed Active  Epi EZ Pen 1 dose(s) injectable , As Needed Active  desonide topical 0.05% topical cream Apply topically to facial areas once a day (in the morning) Active  Cymbalta 30 mg oral delayed release capsule 2 cap(s) orally once a day (in the morning) Active  traMADol 50 mg oral tablet  orally 1 tab every 6 hours as needed for pain. Active  ALPRAZolam extended release 0.5 mg oral tablet, extended release 1 tab(s) orally once a day (in the morning) Active  traZODone  50 mg oral tablet 1 tab(s) orally once a day (at bedtime) Active   Review of Systems:  General negative   Performance Status (ECOG) 0    Skin negative   Breast negative   Ophthalmologic negative   ENMT negative   Respiratory and Thorax negative   Cardiovascular negative   Gastrointestinal negative    Genitourinary negative   Musculoskeletal negative   Neurological negative   Psychiatric negative   Hematology/Lymphatics negative   Endocrine see HPI   Allergic/Immunologic negative   Review of Systems   review of systems obtained from nurses notes  Nursing Notes:  Nursing Vital Signs and Chemo Nursing Nursing Notes:  *CC Vital Signs Flowsheet:   18-Dec-14 09:33  Temp Temperature 98.1  Pulse Pulse 109  Respirations Respirations 18  SBP SBP 118  DBP DBP 68  Pain Scale (0-10)  0  Current Weight (kg) (kg) 125.3  Height (cm) centimeters 168  BSA (m2) 2.2   Physical Exam:  General/Skin/HEENT:  General normal   Skin normal   Eyes normal   ENMT normal   Head and Neck normal   Additional PE well-developed slightly obese female has indications of facial swelling most likely sicker to steroid use. Oral cavity is clear no oral mucosal lesions are identified. Indirect mirror examination shows upper airway clear vallecula and base of tongue within normal limits. She status post recent thyroidectomy incision well-healed. Neck is clear without evidence of some digastric cervical or supraclavicular adenopathy. No peripheral adenopathy is identified lungs are clear to A&P cardiac examination shows regular rate and rhythm. Abdomen is obese but benign.   Breasts/Resp/CV/GI/GU:  Respiratory and Thorax normal   Cardiovascular normal   Gastrointestinal normal   Genitourinary normal   MS/Neuro/Psych/Lymph:  Musculoskeletal normal   Neurological normal   Lymphatics normal   Other Results:  Radiology Results:  LabUnknown:    26-Aug-14 11:02, PET/CT Scan Lymphoma Restaging  PACS Image     16-Dec-14 09:44, PET/CT Scan Lymphoma Restaging  PACS Image   Nuclear Med:    26-Aug-14 11:02, PET/CT Scan Lymphoma Restaging  PET/CT Scan Lymphoma Restaging   REASON FOR EXAM:    hx of hodgkins lymphoma  thyroid nodule  neck mass  COMMENTS:       PROCEDURE: PET - PET/CT  RESTAGING LYMPHOMA  - Oct 06 2012 11:02AM     RESULT: The patient is undergoing restaging of lymphoma. The patient has   a fasting blood glucose level of 85 mg/dL. The patient received 12.2 mCi   of F-18 labeled FDG at 9:30 a.m. with scanning beginning at 10:30 a.m. A   noncontrast CT scan was performed at the same sitting for coregistration   and attenuation correction.    Within the head and neck there is a prominent focus of increased uptake   to the left of midline in the posterior nasopharynx likely an left-sided   adenoidal tissue. This exhibits maximal SUV of 7.7 with a mean of 4.3.   More inferiorly fairly symmetrically increased uptake is seen within the     tonsils with maximal SUV of 6.4 with a mean of 3.5. There is abnormal   uptake within an enlarged lymph node deep to the right   sternocleidomastoid muscle. This exhibits maximal SUV of 8.7 with a mean   of 5.4. It measures 2.9 cm in long axis.    Within the thorax no abnormal uptake is demonstrated. Within the abdomen   and pelvis normal expected urinary tract activity is  seen. Minimal bowel   activity is present. No abnormal accumulation of the radiopharmaceutical   is demonstrated in the abdomen or pelvis.    On the CT images there are densely calcified enlarged AP window lymph   nodes. There is no soft tissue density lymphadenopathy. There is no   pleural nor pericardial effusion. Within the abdomen there is mild   fullness of the medial limb of the right adrenal gland. The left adrenal   gland appears normal. The spleen is not enlarged. The liver, gallbladder,     and kidneys exhibit no acute abnormality. The unopacified loops of small   and large bowel appear normal. The uterus is normal in appearance. There   is soft tissue fullness in the adnexal regions consistent with cystic   ovarian processes. There is no evidence of ascites. No intraperitoneal   nor retroperitoneal lymphadenopathy is  demonstrated.    IMPRESSION:   1. There is abnormal uptake in the left side of the posterior nasopharynx   in the region of the adenoids.  2. Symmetrically increased uptake is present within the tonsils   bilaterally.  3. There is an abnormally enlarged lymph node deep to the right   sternocleidomastoid muscle which is hypermetabolic.  4. Densely calcified AP window lymph nodes are likely related to previous   radiation therapy.   Dictation Site: 2        Verified By: DAVID A. Martinique, M.D., MD    16-Dec-14 09:44, PET/CT Scan Lymphoma Restaging  PET/CT Scan Lymphoma Restaging   REASON FOR EXAM:    restaging lymphoma  COMMENTS:       PROCEDURE: PET - PET/CT RESTAGING LYMPHOMA  - Jan 26 2013  9:44AM     CLINICAL DATA:  Subsequent treatment strategy for lymphoma. History  of thyroidectomy in September 2014 and chemotherapy last month.    EXAM:  NUCLEAR MEDICINE PET SKULL BASE TO THIGH    FASTING BLOOD GLUCOSE:  Value: 106mg /dl    TECHNIQUE:  12.06 mCi F-18 FDG was injected intravenously. CT data was obtained  and used for attenuation correction and anatomic localization only.  (This was not acquired as a diagnostic CT examination.) Additional  exam technical data entered on technologist worksheet.    COMPARISON:  PET-CT 10/06/2012 and 09/27/2005.    FINDINGS:  NECK    There are no residual hypermetabolic cervical lymphnodes.The  previously demonstrated hypermetabolic activity within the  nasopharynx has resolved. There is symmetric tonsillar activity  bilaterally which is similar to the prior study. This demonstrates  an SUV max of 7.2. No other hypermetabolic pharyngeal mucosal  lesions are identified. Thyroidectomy has been performed in the  interval without demonstrated complication.    CHEST    There are no hypermetabolic mediastinal, hilar or axillary lymph  nodes. There is no hypermetabolic pulmonary activity. Left  subclavian Port-A-Cath tip is at the SVC  right atrial junction.  There are calcified AP window lymph nodes which are unchanged. At  the right lung apex, there is a 5 mm nodule on image 112 which is  not clearly seen previously. Mild subpleural nodularity at the left  lung base on image 177 is stable.    ABDOMEN/PELVIS    There is no hypermetabolic activity within the liver, adrenal  glands, spleen or pancreas. There is no hypermetabolic nodal  activity. Hepatic steatosis is noted.    SKELETON    There is no hypermetabolic activity to suggest osseous metastatic  disease.     IMPRESSION:  1. Resolution of previously demonstrated focal nasopharyngeal and  right cervical nodal hypermetabolic activity. Underlying tonsillar  activity issymmetric and similar to the prior study.  2. No new hypermetabolic lesions identified.  3. Small right apical pulmonary nodule does not demonstrate any  increased metabolic activity but is not clearly seen on the prior  study. Given the presence of calcified AP window lymph nodes, this  could reflect a small granuloma. Attention on follow-up recommended.      Electronically Signed    By: Camie Patience M.D.    On: 01/26/2013 10:22         Verified By: Vivia Ewing, M.D.,   Relevent Results:   Relevant Scans and Labs serial PET/CT scans and CT scans are reviewed   Assessment and Plan: Impression:   recurrent Hodgkin's diseasein the neck and patient 13 years out from initial is presentation of Hodgkin's disease. Now status post salvage chemotherapy with complete response by PET CT criteria now for involved field radiation. Plan:   case was presented at joint [facility] tumor conference with our staff. Have discussed the case personally with Dr. Oliva Bustard. Feel strongly in having involved field radiation therapy as was certainly shown to decrease local regional recurrence of Hodgkin's disease and recentASTRO  presentation by Dr. Christa See showing consolidation radiation therapy was associated with  improved survival and decreased chance of salvage transplant. I would plan on delivering 3000 cGy to involve field including head and neck and upper supraclavicular nodes up to 3000 cGy over 3 weeks. Risks and benefits of treatment including decreased taste sensation possible dry mouth, skin reaction, fatigue, and alteration blood count or explained to the patient. Both her and her husband seem to comprehend my treatment plan well.I have set her up for CT simulation right after Christmas holiday.  I would like to take this opportunity to thank you for allowing me to continue to participate in this patient's care.  CC Referral:  cc: Dr. Beverly Gust, Dr. Ancil Boozer   Electronic Signatures: Baruch Gouty, Roda Shutters (MD)  (Signed 18-Dec-14 11:12)  Authored: HPI, Diagnosis, Past Hx, PFSH, Allergies, Home Meds, ROS, Nursing Notes, Physical Exam, Other Results, Relevent Results, Encounter Assessment and Plan, CC Referring Physician   Last Updated: 18-Dec-14 11:12 by Armstead Peaks (MD)

## 2014-06-03 NOTE — Op Note (Signed)
PATIENT NAME:  Jo [name], Jo [name] MR#:  [id number] DATE OF BIRTH:  May 19, 1980  DATE OF PROCEDURE:  10/13/2012  PREOPERATIVE DIAGNOSES:  1.  Multinodular goiter.  2.  Right cervical adenopathy.  3.  Nasopharyngeal mass.   POSTOPERATIVE DIAGNOSES:  1.  Multinodular goiter.  2.  Right cervical adenopathy.  3.  Nasopharyngeal mass.   OPERATIONS PERFORMED:  1.  Total thyroidectomy.  2.  Recurrent laryngeal nerve monitor.  3.  Excision of right deep cervical lymph node.  4.  Endoscopic biopsy of nasopharyngeal mass.     SURGEON: Roena Malady, M.D.   OPERATIVE FINDINGS:  1.  Approximately 3 x 4 cm right deep cervical mass.  2.  Multinodular goiter.  3.  Nasopharyngeal adenoid hypertrophy.   DESCRIPTION OF PROCEDURE: Jo [name] was identified in the holding area, taken to the operating room and placed in the supine position. After general endotracheal anesthesia with a laryngeal nerve monitoring endotracheal tube, the table was moved outward slightly from anesthesia. The neck was placed in gentle extension. A large multinodular goiter was easily identified as was the right anterior cervical neck mass. The neck was prepped and draped sterilely.   An incision line was marked just over the cricoid cartilage and slightly more to the right of midline to allow access to the right upper neck. A local anesthetic of 1% lidocaine with 1:100,000 epinephrine was used to inject along the incision line. A total of 7 mL was used.   With the neck prepped and draped sterilely, a 15-blade was used to incise down to and through the platysma muscle. Hemostasis was achieved using the Bovie cautery. The operation proceeded with the excision of right deep cervical lymph node. The supraplatysmal and infraplatysmal flaps were elevated. The sternocleidomastoid muscle was identified on the right. The mass could be palpated beneath this. The fascia was divided above the omohyoid muscle on the anterior border of the  sternocleidomastoid muscle.   The dissection proceeded medially down to the jugular vein. A 3 x 4 cm spongy mobile lymph node was identified. This was dissected from the surrounding tissue. The ansa cervicalis was identified and preserved. Using the microbipolars and the Harmonic scalpel, the lymph node was excised. It was 1 small attachment measuring approximately 0.5 x 0.5 cm which was also sent with the specimen.   With this lymph node removed and no active bleeding, the operation then turned to the thyroidectomy. The strap muscles were identified in the midline and divided. Beginning on the left-hand side, the left lobe of the gland was dissected. The strap muscles were retracted laterally. There were multiple feeding vessels which were entering the thyroid gland which were divided using the Harmonic scalpel. The superior pole vessels were isolated and divided using the Harmonic scalpel. This allowed the gland to be pulled medially. The superior and inferior parathyroid glands were identified and preserved on their vascular pedicles. The recurrent laryngeal nerve was identified in the tracheoesophageal groove. This was stimulated and remained intact throughout the procedure. The gland was then pulled medially over the anterior tracheal wall. Again, there were multiple feeding vessels which were divided using the Harmonic scalpel. Berry ligament divided. This freed up beautifully the left lobe of the gland.   The right lobe was then dissected free in a similar fashion. The strap muscles were retracted. There were several small feeding vessels which were divided. The superior pole vessels were identified, isolated and divided using Harmonic scalpel. As the gland was peeled medially on  the right hand side, the superior and inferior parathyroid glands were identified on their vascular pedicles and left intact as well. The recurrent laryngeal nerve was identified in the tracheoesophageal groove. This was  stimulated and remained intact throughout the procedure.   As the gland was peeled medially, again Berry ligament was identified. This was divided using the microbipolar and the gland was removed in totality including the isthmus. With the gland removed, the wound was copiously irrigated with saline. There was no active bleeding either in the deep node dissection bed or in the thyroid bed. Both nerves were stimulated at the end of the case and there was excellent movement of the posterior cricoarytenoid muscles. Surgicel was then placed into each vascular bed.   Two #10 TLS drains were brought out of the wound inferiorly. The strap muscles were reapproximated in the midline using 4-0 Vicryl and the platysmal layer was closed using 4-0 Vicryl. The subcutaneous tissues were closed using 4-0 Vicryl, and the skin was closed using Dermabond. Tegaderms were placed to hold the drains in position.   With the neck operation completed, the operation then turned to the endoscopic portion. The table was then turned 90 degrees and gloves were changed. The endoscopic sinus equipment was brought into the field. Cottonoid pledgets with phenylephrine solution were placed within each nostril. After approximately 5 minutes, they were removed. The 0-degree endoscope was introduced into the nose. Examination of the nasopharynx showed an enlarged spongy type mass involving the adenoid, very similar to what appeared in the cervical lymph node. Multiple biopsies were taken from both the right and left nasopharynges and sent for a fresh specimen. The suction cautery was then used to cauterize this nasopharyngeal bed to prevent further bleeding. With this completed, the operation was then returned to anesthesia where she was extubated in the operating room, taking to the recovery room in stable condition.   CULTURES: None.   SPECIMENS: Total thyroid, right cervical lymph node, and nasopharyngeal mass.   ESTIMATED BLOOD LOSS: Less  than 30 mL.     ____________________________ Roena Malady, MD ctm:np D: 10/13/2012 18:30:02 ET T: 10/13/2012 20:20:53 ET JOB#: [id number]  cc: Roena Malady, MD, <Dictator> Roena Malady MD ELECTRONICALLY SIGNED 10/17/2012 8:09

## 2014-06-27 LAB — LIPID PANEL
CHOLESTEROL: 176 mg/dL (ref 0–200)
HDL: 54 mg/dL (ref 35–70)
LDL CALC: 87 mg/dL
TRIGLYCERIDES: 177 mg/dL — AB (ref 40–160)

## 2014-07-13 ENCOUNTER — Ambulatory Visit
Admission: RE | Admit: 2014-07-13 | Discharge: 2014-07-13 | Disposition: A | Discharge status: Home / Self Care | Payer: 59 | Source: Ambulatory Visit | Attending: Oncology | Admitting: Oncology

## 2014-07-13 DIAGNOSIS — Z9221 Personal history of antineoplastic chemotherapy: Secondary | ICD-10-CM | POA: Insufficient documentation

## 2014-07-13 DIAGNOSIS — Z1231 Encounter for screening mammogram for malignant neoplasm of breast: Secondary | ICD-10-CM | POA: Diagnosis not present

## 2014-07-13 DIAGNOSIS — Z8571 Personal history of Hodgkin lymphoma: Secondary | ICD-10-CM | POA: Insufficient documentation

## 2014-08-04 ENCOUNTER — Other Ambulatory Visit: Payer: Self-pay | Admitting: Family Medicine

## 2014-08-05 ENCOUNTER — Telehealth: Payer: Self-pay | Admitting: Family Medicine

## 2014-08-05 NOTE — Telephone Encounter (Signed)
Pt said that you called her and need to know the pharm. It is [facility].

## 2014-08-05 NOTE — Telephone Encounter (Signed)
Notified patient her prescription was called into [facility] pharmacy.

## 2014-08-25 ENCOUNTER — Other Ambulatory Visit: Payer: Self-pay | Admitting: *Deleted

## 2014-08-25 DIAGNOSIS — C8190 Hodgkin lymphoma, unspecified, unspecified site: Secondary | ICD-10-CM

## 2014-08-29 ENCOUNTER — Ambulatory Visit: Payer: 59

## 2014-08-30 ENCOUNTER — Telehealth: Payer: Self-pay | Admitting: *Deleted

## 2014-08-30 NOTE — Telephone Encounter (Signed)
Per Patient, her LMP was on 08/14/14. Sarah notified of this

## 2014-08-31 ENCOUNTER — Inpatient Hospital Stay: Payer: 59

## 2014-08-31 ENCOUNTER — Ambulatory Visit: Payer: Self-pay | Admitting: Oncology

## 2014-08-31 ENCOUNTER — Ambulatory Visit
Admission: RE | Admit: 2014-08-31 | Discharge: 2014-08-31 | Disposition: A | Discharge status: Home / Self Care | Payer: 59 | Source: Ambulatory Visit | Attending: Oncology | Admitting: Oncology

## 2014-08-31 DIAGNOSIS — C8190 Hodgkin lymphoma, unspecified, unspecified site: Secondary | ICD-10-CM | POA: Insufficient documentation

## 2014-08-31 MED ORDER — FLUDEOXYGLUCOSE F - 18 (FDG) INJECTION
12.8800 | Freq: Once | INTRAVENOUS | Status: AC | PRN
  Administered 2014-08-31: 12.88 via INTRAVENOUS

## 2014-09-02 ENCOUNTER — Other Ambulatory Visit: Payer: Self-pay | Admitting: *Deleted

## 2014-09-02 DIAGNOSIS — C8190 Hodgkin lymphoma, unspecified, unspecified site: Secondary | ICD-10-CM

## 2014-09-05 ENCOUNTER — Inpatient Hospital Stay (HOSPITAL_BASED_OUTPATIENT_CLINIC_OR_DEPARTMENT_OTHER): Payer: 59 | Admitting: Oncology

## 2014-09-05 ENCOUNTER — Inpatient Hospital Stay: Payer: 59 | Attending: Oncology

## 2014-09-05 ENCOUNTER — Encounter: Payer: Self-pay | Admitting: Oncology

## 2014-09-05 VITALS — BP 140/84 | HR 109 | Temp 98.6°F | Resp 18 | Wt 271.6 lb

## 2014-09-05 DIAGNOSIS — R109 Unspecified abdominal pain: Secondary | ICD-10-CM | POA: Insufficient documentation

## 2014-09-05 DIAGNOSIS — Z9221 Personal history of antineoplastic chemotherapy: Secondary | ICD-10-CM | POA: Diagnosis not present

## 2014-09-05 DIAGNOSIS — Z79899 Other long term (current) drug therapy: Secondary | ICD-10-CM | POA: Diagnosis not present

## 2014-09-05 DIAGNOSIS — Z8571 Personal history of Hodgkin lymphoma: Secondary | ICD-10-CM

## 2014-09-05 DIAGNOSIS — Z803 Family history of malignant neoplasm of breast: Secondary | ICD-10-CM

## 2014-09-05 DIAGNOSIS — E669 Obesity, unspecified: Secondary | ICD-10-CM | POA: Diagnosis not present

## 2014-09-05 DIAGNOSIS — C8190 Hodgkin lymphoma, unspecified, unspecified site: Secondary | ICD-10-CM

## 2014-09-05 DIAGNOSIS — Z87891 Personal history of nicotine dependence: Secondary | ICD-10-CM

## 2014-09-05 DIAGNOSIS — R590 Localized enlarged lymph nodes: Secondary | ICD-10-CM

## 2014-09-05 DIAGNOSIS — Z923 Personal history of irradiation: Secondary | ICD-10-CM | POA: Diagnosis not present

## 2014-09-05 LAB — CBC WITH DIFFERENTIAL/PLATELET
Basophils Absolute: 0.1 10*3/uL (ref 0–0.1)
Basophils Relative: 1 %
EOS PCT: 2 %
Eosinophils Absolute: 0.2 10*3/uL (ref 0–0.7)
HEMATOCRIT: 35.2 % (ref 35.0–47.0)
HEMOGLOBIN: 11.4 g/dL — AB (ref 12.0–16.0)
Lymphocytes Relative: 15 %
Lymphs Abs: 1.8 10*3/uL (ref 1.0–3.6)
MCH: 24 pg — ABNORMAL LOW (ref 26.0–34.0)
MCHC: 32.3 g/dL (ref 32.0–36.0)
MCV: 74.4 fL — ABNORMAL LOW (ref 80.0–100.0)
Monocytes Absolute: 0.7 10*3/uL (ref 0.2–0.9)
Monocytes Relative: 6 %
NEUTROS ABS: 8.7 10*3/uL — AB (ref 1.4–6.5)
Neutrophils Relative %: 76 %
Platelets: 296 10*3/uL (ref 150–440)
RBC: 4.73 MIL/uL (ref 3.80–5.20)
RDW: 15.6 % — ABNORMAL HIGH (ref 11.5–14.5)
WBC: 11.5 10*3/uL — ABNORMAL HIGH (ref 3.6–11.0)

## 2014-09-05 LAB — COMPREHENSIVE METABOLIC PANEL
ALT: 20 U/L (ref 14–54)
ANION GAP: 7 (ref 5–15)
AST: 21 U/L (ref 15–41)
Albumin: 3.9 g/dL (ref 3.5–5.0)
Alkaline Phosphatase: 61 U/L (ref 38–126)
BUN: 16 mg/dL (ref 6–20)
CO2: 27 mmol/L (ref 22–32)
Calcium: 7.7 mg/dL — ABNORMAL LOW (ref 8.9–10.3)
Chloride: 101 mmol/L (ref 101–111)
Creatinine, Ser: 0.76 mg/dL (ref 0.44–1.00)
GFR calc Af Amer: >60 mL/min (ref >60)
GFR calc non Af Amer: >60 mL/min (ref >60)
GLUCOSE: 118 mg/dL — AB (ref 65–99)
Potassium: 3.5 mmol/L (ref 3.5–5.1)
Sodium: 135 mmol/L (ref 135–145)
Total Bilirubin: 0.4 mg/dL (ref 0.3–1.2)
Total Protein: 7.4 g/dL (ref 6.5–8.1)

## 2014-09-05 LAB — LACTATE DEHYDROGENASE: LDH: 128 U/L (ref 98–192)

## 2014-09-05 NOTE — Progress Notes (Signed)
Patient does not have living will.  Materials given.  Former smoker.

## 2014-09-09 ENCOUNTER — Ambulatory Visit
Admission: EM | Admit: 2014-09-09 | Discharge: 2014-09-09 | Disposition: A | Discharge status: Home / Self Care | Payer: 59 | Attending: Internal Medicine | Admitting: Internal Medicine

## 2014-09-09 DIAGNOSIS — Z79899 Other long term (current) drug therapy: Secondary | ICD-10-CM | POA: Insufficient documentation

## 2014-09-09 DIAGNOSIS — Z8571 Personal history of Hodgkin lymphoma: Secondary | ICD-10-CM | POA: Diagnosis not present

## 2014-09-09 DIAGNOSIS — R197 Diarrhea, unspecified: Secondary | ICD-10-CM | POA: Diagnosis present

## 2014-09-09 DIAGNOSIS — K529 Noninfective gastroenteritis and colitis, unspecified: Secondary | ICD-10-CM

## 2014-09-09 DIAGNOSIS — Z87891 Personal history of nicotine dependence: Secondary | ICD-10-CM | POA: Insufficient documentation

## 2014-09-09 DIAGNOSIS — R112 Nausea with vomiting, unspecified: Secondary | ICD-10-CM | POA: Diagnosis present

## 2014-09-09 HISTORY — DX: Hodgkin lymphoma, unspecified, unspecified site: C81.90

## 2014-09-09 LAB — COMPREHENSIVE METABOLIC PANEL
ALK PHOS: 64 U/L (ref 38–126)
ALT: 18 U/L (ref 14–54)
AST: 14 U/L — AB (ref 15–41)
Albumin: 4 g/dL (ref 3.5–5.0)
Anion gap: 12 (ref 5–15)
BILIRUBIN TOTAL: 0.2 mg/dL — AB (ref 0.3–1.2)
BUN: 14 mg/dL (ref 6–20)
CHLORIDE: 96 mmol/L — AB (ref 101–111)
CO2: 27 mmol/L (ref 22–32)
CREATININE: 0.68 mg/dL (ref 0.44–1.00)
Calcium: 7.9 mg/dL — ABNORMAL LOW (ref 8.9–10.3)
GFR calc Af Amer: >60 mL/min (ref >60)
GFR calc non Af Amer: >60 mL/min (ref >60)
Glucose, Bld: 91 mg/dL (ref 65–99)
Potassium: 3.7 mmol/L (ref 3.5–5.1)
SODIUM: 135 mmol/L (ref 135–145)
Total Protein: 7.7 g/dL (ref 6.5–8.1)

## 2014-09-09 LAB — URINALYSIS COMPLETE WITH MICROSCOPIC (ARMC ONLY)
Bilirubin Urine: NEGATIVE
GLUCOSE, UA: NEGATIVE mg/dL
Ketones, ur: NEGATIVE mg/dL
Nitrite: NEGATIVE
PH: 5.5 (ref 5.0–8.0)
Protein, ur: NEGATIVE mg/dL
SPECIFIC GRAVITY, URINE: 1.02 (ref 1.005–1.030)

## 2014-09-09 LAB — CBC WITH DIFFERENTIAL/PLATELET
BASOS ABS: 0.1 10*3/uL (ref 0–0.1)
Basophils Relative: 1 %
EOS ABS: 0.3 10*3/uL (ref 0–0.7)
EOS PCT: 3 %
HEMATOCRIT: 36 % (ref 35.0–47.0)
HEMOGLOBIN: 11.8 g/dL — AB (ref 12.0–16.0)
Lymphocytes Relative: 19 %
Lymphs Abs: 2.1 10*3/uL (ref 1.0–3.6)
MCH: 24.2 pg — ABNORMAL LOW (ref 26.0–34.0)
MCHC: 32.8 g/dL (ref 32.0–36.0)
MCV: 73.9 fL — AB (ref 80.0–100.0)
Monocytes Absolute: 0.8 10*3/uL (ref 0.2–0.9)
Monocytes Relative: 7 %
NEUTROS ABS: 8 10*3/uL — AB (ref 1.4–6.5)
Neutrophils Relative %: 70 %
Platelets: 307 10*3/uL (ref 150–440)
RBC: 4.87 MIL/uL (ref 3.80–5.20)
RDW: 15.7 % — ABNORMAL HIGH (ref 11.5–14.5)
WBC: 11.4 10*3/uL — ABNORMAL HIGH (ref 3.6–11.0)

## 2014-09-09 LAB — AMYLASE: Amylase: 38 U/L (ref 28–100)

## 2014-09-09 LAB — LIPASE, BLOOD: Lipase: 17 U/L — ABNORMAL LOW (ref 22–51)

## 2014-09-09 LAB — HCG, QUANTITATIVE, PREGNANCY: hCG, Beta Chain, Quant, S: <1 m[IU]/mL (ref <5)

## 2014-09-09 MED ORDER — FAMOTIDINE 40 MG PO TABS: 40.0000 mg | ORAL_TABLET | Freq: Two times a day (BID) | ORAL | Status: DC

## 2014-09-09 MED ORDER — ONDANSETRON 8 MG PO TBDP
8.0000 mg | ORAL_TABLET | Freq: Once | ORAL | Status: AC
  Administered 2014-09-09: 8 mg via ORAL

## 2014-09-09 MED ORDER — ONDANSETRON HCL 4 MG PO TABS: 8.0000 mg | ORAL_TABLET | ORAL | Status: DC | PRN

## 2014-09-09 NOTE — ED Notes (Signed)
Pt reading a magazine, fluids infusing. States "I feel much better."

## 2014-09-09 NOTE — ED Notes (Signed)
Call to [facility], [facility] she will come access pt's port-a-cath.

## 2014-09-09 NOTE — ED Provider Notes (Signed)
CSN: [id number]     Arrival date & time 09/09/14  1962 History   First MD Initiated Contact with Patient 09/09/14 1008     Chief Complaint  Patient presents with  . Emesis  . Diarrhea   (Consider location/radiation/quality/duration/timing/severity/associated sxs/prior Treatment) HPI Comments: Caucasian female receiving IV hydration assumed care from Dr Valere Dross at shift change 1310 had nausea/vomiting/diarrhea x4 days.  The history is provided by the patient.    Past Medical History  Diagnosis Date  . Hodgkin disease 2001 and 2014    chemo and rad tx in 2001 and 2014  . Lymphoma, Hodgkin's     2001, 2014   Past Surgical History  Procedure Laterality Date  . Thyroidectomy  10-13-12  . Port-a-cath removal  2004  . Thyroid surgery    . Portacath placement  11-24-12   Family History  Problem Relation Age of Onset  . Breast cancer Paternal Aunt     great aunt >50  . Breast cancer Cousin    History  Substance Use Topics  . Smoking status: Former Smoker -- 1.00 packs/day for 2 years  . Smokeless tobacco: Never Used  . Alcohol Use: Not on file   OB History    Gravida Para Term Preterm AB TAB SAB Ectopic Multiple Living   0              Review of Systems  Constitutional: Negative for fever, chills, diaphoresis, activity change, appetite change and fatigue.  HENT: Negative for dental problem, drooling, ear discharge, facial swelling, hearing loss, mouth sores, nosebleeds, sneezing, sore throat, trouble swallowing and voice change.   Eyes: Negative for photophobia, pain, discharge, redness, itching and visual disturbance.  Respiratory: Negative for cough, shortness of breath, wheezing and stridor.   Cardiovascular: Negative for chest pain, palpitations and leg swelling.  Gastrointestinal: Positive for nausea, vomiting and diarrhea. Negative for abdominal pain, constipation, blood in stool, abdominal distention, anal bleeding and rectal pain.  Endocrine: Negative for cold  intolerance and heat intolerance.  Genitourinary: Negative for dysuria and difficulty urinating.  Musculoskeletal: Negative for myalgias, back pain, joint swelling, arthralgias, gait problem, neck pain and neck stiffness.  Skin: Negative for color change, pallor, rash and wound.  Allergic/Immunologic: Positive for environmental allergies. Negative for food allergies.  Neurological: Positive for dizziness. Negative for tremors, seizures, syncope, facial asymmetry, speech difficulty, weakness, light-headedness, numbness and headaches.  Hematological: Does not bruise/bleed easily.  Psychiatric/Behavioral: Negative for behavioral problems, confusion, sleep disturbance and agitation.    Allergies  Tape; Iodinated diagnostic agents; Barium-containing compounds; and Other  Home Medications   Prior to Admission medications   Medication Sig Start Date End Date Taking? Authorizing Provider  ALPRAZolam (XANAX XR) 1 MG 24 hr tablet TAKE 1 TABLET BY MOUTH EVERY DAY 08/05/14  Yes Steele Sizer, MD  ALPRAZolam Duanne Moron) 0.5 MG tablet Take 0.5 mg by mouth at bedtime as needed.  10/04/12  Yes Historical Provider, MD  Biotin 1 MG CAPS Take by mouth.   Yes Historical Provider, MD  calcitRIOL (ROCALTROL) 0.5 MCG capsule  06/01/14  Yes Historical Provider, MD  calcitRIOL (ROCALTROL) 0.5 MCG capsule Take by mouth. 11/05/13 11/05/14 Yes Historical Provider, MD  calcium acetate (PHOSLO) 667 MG tablet Take by mouth.   Yes Historical Provider, MD  desonide (DESOWEN) 0.05 % cream  06/01/14  Yes Historical Provider, MD  DULoxetine (CYMBALTA) 30 MG capsule Take 90 mg by mouth daily.  08/19/12  Yes Historical Provider, MD  EPINEPHrine 0.3 mg/0.3 mL IJ SOAJ  injection  06/19/10  Yes Historical Provider, MD  fexofenadine (ALLEGRA) 180 MG tablet Take by mouth.   Yes Historical Provider, MD  SYNTHROID 200 MCG tablet  06/01/14  Yes Historical Provider, MD  VICODIN 5-300 MG TABS TK 1 T PO BID PRF CRAMPS/PAIN 05/31/14  Yes Historical  Provider, MD  Vitamin D, Ergocalciferol, (DRISDOL) 50000 UNITS CAPS capsule  03/30/10  Yes Historical Provider, MD  famotidine (PEPCID) 40 MG tablet Take 1 tablet (40 mg total) by mouth 2 (two) times daily. 09/09/14   Sherlene Shams, MD  fluconazole (DIFLUCAN) 100 MG tablet  12/02/12   Historical Provider, MD  HYDROcodone-acetaminophen (NORCO/VICODIN) 5-325 MG per tablet Take 1 tablet by mouth every 6 (six) hours as needed.  10/14/12   Historical Provider, MD  hydrOXYzine (ATARAX/VISTARIL) 25 MG tablet Take 25 mg by mouth every 6 (six) hours as needed.  10/04/12   Historical Provider, MD  levothyroxine (SYNTHROID) 200 MCG tablet Take by mouth. 08/27/13   Historical Provider, MD  omeprazole (PRILOSEC OTC) 20 MG tablet Take by mouth. 06/19/10   Historical Provider, MD  ondansetron (ZOFRAN) 4 MG tablet  12/01/12   Historical Provider, MD  ondansetron (ZOFRAN) 4 MG tablet Take 2 tablets (8 mg total) by mouth every 4 (four) hours as needed for nausea or vomiting. 09/09/14   Sherlene Shams, MD  SYNTHROID 125 MCG tablet Take 125 mcg by mouth daily before breakfast.  10/27/12   Historical Provider, MD  traZODone (DESYREL) 100 MG tablet Take by mouth. 09/27/13   Historical Provider, MD   BP 122/79 mmHg  Pulse 80  Temp(Src) 98.3 F (36.8 C) (Tympanic)  Resp 16  Ht 5\' 6"  (1.676 m)  Wt 271 lb (122.925 kg)  BMI 43.76 kg/m2  SpO2 100%  LMP 08/14/2014 (Exact Date) Physical Exam  Constitutional: She is oriented to person, place, and time. Vital signs are normal. She appears well-developed and well-nourished. No distress.  HENT:  Head: Normocephalic and atraumatic.  Right Ear: External ear normal.  Left Ear: External ear normal.  Nose: Nose normal.  Mouth/Throat: Oropharynx is clear and moist. No oropharyngeal exudate.  Eyes: Conjunctivae, EOM and lids are normal. Pupils are equal, round, and reactive to light. Right eye exhibits no discharge. Left eye exhibits no discharge. No scleral icterus.  Neck: Trachea  normal and normal range of motion. Neck supple. No tracheal deviation present.  Cardiovascular: Normal rate, regular rhythm, normal heart sounds and intact distal pulses.  Exam reveals no gallop and no friction rub.   No murmur heard. Pulmonary/Chest: Effort normal and breath sounds normal. No stridor. No respiratory distress. She has no wheezes. She has no rales. She exhibits no tenderness.  Abdominal: Soft. She exhibits no shifting dullness, no distension, no pulsatile liver, no abdominal bruit, no pulsatile midline mass and no mass. Bowel sounds are decreased. There is no hepatosplenomegaly. There is no tenderness. There is no rigidity, no rebound, no guarding, no CVA tenderness, no tenderness at McBurney's point and negative Murphy's sign. Hernia confirmed negative in the ventral area.  Abdomen dull to percussion x 4 quads  Musculoskeletal: Normal range of motion. She exhibits no edema or tenderness.  Neurological: She is alert and oriented to person, place, and time. She exhibits normal muscle tone. Coordination normal.  Skin: Skin is warm, dry and intact. No rash noted. She is not diaphoretic. No erythema. No pallor.  Psychiatric: She has a normal mood and affect. Her speech is normal and behavior is normal. Judgment and  thought content normal. Cognition and memory are normal.  Nursing note and vitals reviewed.   ED Course  Procedures (including critical care time) Labs Review Labs Reviewed  COMPREHENSIVE METABOLIC PANEL - Abnormal; Notable for the following:    Chloride 96 (*)    Calcium 7.9 (*)    AST 14 (*)    Total Bilirubin 0.2 (*)    All other components within normal limits  CBC WITH DIFFERENTIAL/PLATELET - Abnormal; Notable for the following:    WBC 11.4 (*)    Hemoglobin 11.8 (*)    MCV 73.9 (*)    MCH 24.2 (*)    RDW 15.7 (*)    Neutro Abs 8.0 (*)    All other components within normal limits  LIPASE, BLOOD - Abnormal; Notable for the following:    Lipase 17 (*)    All  other components within normal limits  URINALYSIS COMPLETEWITH MICROSCOPIC ([facility] ONLY) - Abnormal; Notable for the following:    Hgb urine dipstick 2+ (*)    Leukocytes, UA TRACE (*)    Bacteria, UA FEW (*)    Squamous Epithelial / LPF 6-30 (*)    All other components within normal limits  URINE CULTURE  AMYLASE  HCG, QUANTITATIVE, PREGNANCY    Imaging Review No results found.  Report from Dr Valere Dross at 1310 at shift change.  Patient lying on gurney with IVF infusing to port bolus without difficulty.  Patient reported slight nausea starting to feel like she could void soon.  "Zofran has helped a lot"  Discussed that Dr Valere Dross has already sent her discharge prescriptions.  Patient reported [facility] had already sent her email they were ready for pick up.  Notified patient I was assuming her care and to notify me if any questions or concerns.  Patient verbalized understanding of information/instructions, agreed with plan of care and had no further questions at this time.  1340 patient ambulated to bathroom 1 liter bolus complete.  Clear yellow urine without difficulty.  Slight dizzyness with position change lying to standing but gait sure and steady in hall with assist.  1430  Patient reported nausea resolved requested po intake--crackers and ginger ale given by RN Raylene Miyamoto.  1515 patient tolerating po intake without nausea, vomiting.  Symptoms completely resolved.   IVF continuing to port without difficulty.  Patient sitting up without dizziness.  Has not had urge to void again.  Approximately 300cc fluids left to infuse.  1545 IV fluids completed awaiting cancer center RN to come and deaccess port. VSS.  Patient ready to go home.  Nausea/vomiting still resolved and tolerated pack of crackers and ginger ale without difficulty.  Patient to follow up if new or worsening symptoms and or symptoms do not resolve as anticipated.  Patient verbalized understanding of information/instructions,  agreed with plan of care and had no further questions at this time.  Medications  ondansetron (ZOFRAN-ODT) disintegrating tablet 8 mg (8 mg Oral Given 09/09/14 1009)   Filed Vitals:   09/09/14 1000  BP: 122/79  Pulse: 80  Temp: 98.3 F (36.8 C)  Resp: 16   Orthostatic VS for the past 24 hrs:  BP- Lying Pulse- Lying BP- Sitting Pulse- Sitting BP- Standing at 0 minutes Pulse- Standing at 0 minutes  09/09/14 1026 125/84 mmHg 90 134/82 mmHg 92 140/86 mmHg 94   Filed Vitals:   09/09/14 1500  BP: 130/76  Pulse: 76  Temp: 98.4 F (36.9 C)  Resp: 16     MDM  1. Acute gastroenteritis    I have recommended clear fluids and the BRAT diet.  Medications as directed Rx for zofran and pepcid from Dr Valere Dross electronically sent to [facility].  Return to the clinic if  symptoms persist or worsen; I have alerted the patient to call if high fever, unable to urinate every 8 hours, dehydration, marked weakness, fainting, increased abdominal pain, blood in stool or vomit. exitcare handout on gastroenteritis given to patient.  Patient verbalized agreement and understanding of treatment plan and had no further questions at this time.   P2:  Hand washing and fitness    Olen Cordial, NP 09/09/14 1558

## 2014-09-09 NOTE — ED Provider Notes (Signed)
CSN: [id number]     Arrival date & time 09/09/14  6203 History   First MD Initiated Contact with Patient 09/09/14 1008     Chief Complaint  Patient presents with  . Emesis  . Diarrhea   HPI 34 year old lady with past medical history notable for Hodgkin's disease, had a clean scan on July 25. Had the onset of vomiting and diarrhea July 26. Severe nausea persists, and she continues to have intermittent emesis. Diarrhea has improved. Malaise. No fever that she knows of. Denies lightheadedness. Still making urine. Reports abdominal discomfort, bloating/gassiness/rumbling, not really pain. Was able to walk into the urgent care independently.  Past Medical History  Diagnosis Date  . Hodgkin disease 2001 and 2014    chemo and rad tx in 2001 and 2014  . Lymphoma, Hodgkin's     2001, 2014   Past Surgical History  Procedure Laterality Date  . Thyroidectomy  10-13-12  . Port-a-cath removal  2004  . Thyroid surgery    . Portacath placement  11-24-12   Family History  Problem Relation Age of Onset  . Breast cancer Paternal Aunt     great aunt >50  . Breast cancer Cousin    History  Substance Use Topics  . Smoking status: Former Smoker -- 1.00 packs/day for 2 years  . Smokeless tobacco: Never Used  . Alcohol Use: Not on file    Review of Systems  All other systems reviewed and are negative.   Allergies  Tape; Iodinated diagnostic agents; Barium-containing compounds; and Other  Home Medications   Prior to Admission medications   Medication Sig Start Date End Date Taking? Authorizing Provider  ALPRAZolam (XANAX XR) 1 MG 24 hr tablet TAKE 1 TABLET BY MOUTH EVERY DAY 08/05/14  Yes Steele Sizer, MD  ALPRAZolam Duanne Moron) 0.5 MG tablet Take 0.5 mg by mouth at bedtime as needed.  10/04/12  Yes Historical Provider, MD  Biotin 1 MG CAPS Take by mouth.   Yes Historical Provider, MD  calcitRIOL (ROCALTROL) 0.5 MCG capsule  06/01/14  Yes Historical Provider, MD  calcitRIOL (ROCALTROL) 0.5 MCG  capsule Take by mouth. 11/05/13 11/05/14 Yes Historical Provider, MD  calcium acetate (PHOSLO) 667 MG tablet Take by mouth.   Yes Historical Provider, MD  desonide (DESOWEN) 0.05 % cream  06/01/14  Yes Historical Provider, MD  DULoxetine (CYMBALTA) 30 MG capsule Take 90 mg by mouth daily.  08/19/12  Yes Historical Provider, MD  EPINEPHrine 0.3 mg/0.3 mL IJ SOAJ injection  06/19/10  Yes Historical Provider, MD  fexofenadine (ALLEGRA) 180 MG tablet Take by mouth.   Yes Historical Provider, MD  SYNTHROID 200 MCG tablet  06/01/14  Yes Historical Provider, MD  VICODIN 5-300 MG TABS TK 1 T PO BID PRF CRAMPS/PAIN 05/31/14  Yes Historical Provider, MD  Vitamin D, Ergocalciferol, (DRISDOL) 50000 UNITS CAPS capsule  03/30/10  Yes Historical Provider, MD  fluconazole (DIFLUCAN) 100 MG tablet  12/02/12   Historical Provider, MD  HYDROcodone-acetaminophen (NORCO/VICODIN) 5-325 MG per tablet Take 1 tablet by mouth every 6 (six) hours as needed.  10/14/12   Historical Provider, MD  hydrOXYzine (ATARAX/VISTARIL) 25 MG tablet Take 25 mg by mouth every 6 (six) hours as needed.  10/04/12   Historical Provider, MD  levothyroxine (SYNTHROID) 200 MCG tablet Take by mouth. 08/27/13   Historical Provider, MD  omeprazole (PRILOSEC OTC) 20 MG tablet Take by mouth. 06/19/10   Historical Provider, MD  ondansetron (ZOFRAN) 4 MG tablet  12/01/12   Historical Provider,  MD  SYNTHROID 125 MCG tablet Take 125 mcg by mouth daily before breakfast.  10/27/12   Historical Provider, MD  traZODone (DESYREL) 100 MG tablet Take by mouth. 09/27/13   Historical Provider, MD   BP 122/79 mmHg  Pulse 80  Temp(Src) 98.3 F (36.8 C) (Tympanic)  Resp 16  Ht 5\' 6"  (1.676 m)  Wt 271 lb (122.925 kg)  BMI 43.76 kg/m2  SpO2 100%  LMP 08/14/2014 (Exact Date) Physical Exam  Constitutional: She is oriented to person, place, and time. No distress.  Alert, sitting up on end of exam table. Looks ill but not toxic. Able to walk into urgent care independently.   HENT:  Head: Atraumatic.  Eyes:  Conjugate gaze, no eye redness/drainage  Neck: Neck supple.  Cardiovascular: Normal rate.   Pulmonary/Chest: No respiratory distress.  Abdominal: She exhibits no distension.  Musculoskeletal: Normal range of motion.  No leg swelling  Neurological: She is alert and oriented to person, place, and time.  Skin: Skin is warm. There is pallor.  No cyanosis  Nursing note and vitals reviewed.  Orthostatic VS for the past 24 hrs:  BP- Lying Pulse- Lying BP- Sitting Pulse- Sitting BP- Standing at 0 minutes Pulse- Standing at 0 minutes  09/09/14 1026 125/84 mmHg 90 134/82 mmHg 92 140/86 mmHg 94    ED Course  Procedures  Results for orders placed or performed during the hospital encounter of 09/09/14  Comprehensive metabolic panel  Result Value Ref Range   Sodium 135 135 - 145 mmol/L   Potassium 3.7 3.5 - 5.1 mmol/L   Chloride 96 (L) 101 - 111 mmol/L   CO2 27 22 - 32 mmol/L   Glucose, Bld 91 65 - 99 mg/dL   BUN 14 6 - 20 mg/dL   Creatinine, Ser 0.68 0.44 - 1.00 mg/dL   Calcium 7.9 (L) 8.9 - 10.3 mg/dL   Total Protein 7.7 6.5 - 8.1 g/dL   Albumin 4.0 3.5 - 5.0 g/dL   AST 14 (L) 15 - 41 U/L   ALT 18 14 - 54 U/L   Alkaline Phosphatase 64 38 - 126 U/L   Total Bilirubin 0.2 (L) 0.3 - 1.2 mg/dL   GFR calc non Af Amer >60 >60 mL/min   GFR calc Af Amer >60 >60 mL/min   Anion gap 12 5 - 15  CBC with Differential  Result Value Ref Range   WBC 11.4 (H) 3.6 - 11.0 K/uL   RBC 4.87 3.80 - 5.20 MIL/uL   Hemoglobin 11.8 (L) 12.0 - 16.0 g/dL   HCT 36.0 35.0 - 47.0 %   MCV 73.9 (L) 80.0 - 100.0 fL   MCH 24.2 (L) 26.0 - 34.0 pg   MCHC 32.8 32.0 - 36.0 g/dL   RDW 15.7 (H) 11.5 - 14.5 %   Platelets 307 150 - 440 K/uL   Neutrophils Relative % 70 %   Neutro Abs 8.0 (H) 1.4 - 6.5 K/uL   Lymphocytes Relative 19 %   Lymphs Abs 2.1 1.0 - 3.6 K/uL   Monocytes Relative 7 %   Monocytes Absolute 0.8 0.2 - 0.9 K/uL   Eosinophils Relative 3 %   Eosinophils Absolute  0.3 0 - 0.7 K/uL   Basophils Relative 1 %   Basophils Absolute 0.1 0 - 0.1 K/uL  Lipase, blood  Result Value Ref Range   Lipase 17 (L) 22 - 51 U/L  Amylase  Result Value Ref Range   Amylase 38 28 - 100 U/L  hCG,  quantitative, pregnancy  Result Value Ref Range   hCG, Beta Chain, Quant, S <1 <5 mIU/mL  Urinalysis complete, with microscopic  Result Value Ref Range   Color, Urine YELLOW YELLOW   APPearance CLEAR CLEAR   Glucose, UA NEGATIVE NEGATIVE mg/dL   Bilirubin Urine NEGATIVE NEGATIVE   Ketones, ur NEGATIVE NEGATIVE mg/dL   Specific Gravity, Urine 1.020 1.005 - 1.030   Hgb urine dipstick 2+ (A) NEGATIVE   pH 5.5 5.0 - 8.0   Protein, ur NEGATIVE NEGATIVE mg/dL   Nitrite NEGATIVE NEGATIVE   Leukocytes, UA TRACE (A) NEGATIVE   RBC / HPF 6-30 <3 RBC/hpf   WBC, UA 0-5 <3 WBC/hpf   Bacteria, UA FEW (A) RARE   Squamous Epithelial / LPF 6-30 (A) RARE   Urine cx pending.  MDM   1. Acute gastroenteritis    New Prescriptions   FAMOTIDINE (PEPCID) 40 MG TABLET    Take 1 tablet (40 mg total) by mouth 2 (two) times daily.   ONDANSETRON (ZOFRAN) 4 MG TABLET    Take 2 tablets (8 mg total) by mouth every 4 (four) hours as needed for nausea or vomiting.   Recheck or followup pcp/Dr Sowles for persistent symptoms. To ER if worse.    Sherlene Shams, MD 09/09/14 [phone]

## 2014-09-09 NOTE — ED Notes (Signed)
Port accessed, labs obtained by [employer] at [facility]

## 2014-09-09 NOTE — Discharge Instructions (Signed)
Prescriptions for zofran (ondansetron, anti-nausea) and pepcid (famotidine, for stomach acid) were sent to the pharmacy. Rest and push fluids. Recheck or followup pcp/Dr Sowles if not improving in a few dasy.  Viral Gastroenteritis Viral gastroenteritis is also known as stomach flu. This condition affects the stomach and intestinal tract. It can cause sudden diarrhea and vomiting. The illness typically lasts 3 to 8 days. Most people develop an immune response that eventually gets rid of the virus. While this natural response develops, the virus can make you quite ill. CAUSES  Many different viruses can cause gastroenteritis, such as rotavirus or noroviruses. You can catch one of these viruses by consuming contaminated food or water. You may also catch a virus by sharing utensils or other personal items with an infected person or by touching a contaminated surface. SYMPTOMS  The most common symptoms are diarrhea and vomiting. These problems can cause a severe loss of body fluids (dehydration) and a body salt (electrolyte) imbalance. Other symptoms may include:  Fever.  Headache.  Fatigue.  Abdominal pain. DIAGNOSIS  Your caregiver can usually diagnose viral gastroenteritis based on your symptoms and a physical exam. A stool sample may also be taken to test for the presence of viruses or other infections. TREATMENT  This illness typically goes away on its own. Treatments are aimed at rehydration. The most serious cases of viral gastroenteritis involve vomiting so severely that you are not able to keep fluids down. In these cases, fluids must be given through an intravenous line (IV). HOME CARE INSTRUCTIONS   Drink enough fluids to keep your urine clear or pale yellow. Drink small amounts of fluids frequently and increase the amounts as tolerated.  Ask your caregiver for specific rehydration instructions.  Avoid:  Foods high in sugar.  Alcohol.  Carbonated  drinks.  Tobacco.  Juice.  Caffeine drinks.  Extremely hot or cold fluids.  Fatty, greasy foods.  Too much intake of anything at one time.  Dairy products until 24 to 48 hours after diarrhea stops.  You may consume probiotics. Probiotics are active cultures of beneficial bacteria. They may lessen the amount and number of diarrheal stools in adults. Probiotics can be found in yogurt with active cultures and in supplements.  Wash your hands well to avoid spreading the virus.  Only take over-the-counter or prescription medicines for pain, discomfort, or fever as directed by your caregiver. Do not give aspirin to children. Antidiarrheal medicines are not recommended.  Ask your caregiver if you should continue to take your regular prescribed and over-the-counter medicines.  Keep all follow-up appointments as directed by your caregiver. SEEK IMMEDIATE MEDICAL CARE IF:   You are unable to keep fluids down.  You do not urinate at least once every 6 to 8 hours.  You develop shortness of breath.  You notice blood in your stool or vomit. This may look like coffee grounds.  You have abdominal pain that increases or is concentrated in one small area (localized).  You have persistent vomiting or diarrhea.  You have a fever.  The patient is a child younger than 3 months, and he or she has a fever.  The patient is a child older than 3 months, and he or she has a fever and persistent symptoms.  The patient is a child older than 3 months, and he or she has a fever and symptoms suddenly get worse.  The patient is a baby, and he or she has no tears when crying. MAKE SURE YOU:  Understand these instructions.  Will watch your condition.  Will get help right away if you are not doing well or get worse. Document Released: 01/28/2005 Document Revised: 04/22/2011 Document Reviewed: 11/14/2010 [facility] Patient Information 2015 [place], [place]. This information is not intended to replace  advice given to you by your health care provider. Make sure you discuss any questions you have with your health care provider.

## 2014-09-09 NOTE — ED Notes (Signed)
Pt states "I have vomiting and diarrhea off and on since Tuesday morning. I had a good check up with oncologist on Monday-Clear PET scan, I had lymphoma in 2014. I ate well and went to bed on Monday and woke up Tuesday sick." Denies fevers or chills. I worked some yesterday but had to leave. I need a work note.

## 2014-09-09 NOTE — ED Notes (Signed)
Pt resting. Fluids infusing. Verb relief of nausea. Very appreciative of care.

## 2014-09-10 ENCOUNTER — Encounter: Payer: Self-pay | Admitting: Oncology

## 2014-09-10 NOTE — Progress Notes (Signed)
Jo [name] @ [facility] [phone]  [phone]     Jo [name] OB: September 23, 1980  MR#: [id number]  JJK#:[id number]  Patient Care Team: Steele Sizer, MD as PCP - General (Family Medicine) Seeplaputhur Robinette Haines, MD (General Surgery) Forest Gleason, MD (Unknown Physician Specialty)  CHIEF COMPLAINT:  Chief Complaint  Patient presents with  . Follow-up    Chief Complaint/Diagnosis:   34 year old lady who had a recent ultrasound of the neck revealing lymphadenopathy.   Has a history of Hodgkin's disease treated when patient was 34 years old. 2.  Thyroid goiter 3.  Obesity 4.status post thyroidectomy Biopsy from nasopharyngeal area is benign.  Biopsy from the lymph node on the right side of the neck is consistent with Hodgkin's disease(  prelimary report from [facility]) 5.started on  BEACOPP from December 01, 2012 6.finished 2 cycles of  BEACOPP.  December of 2014 patient is now referred for radiation therapy 7.patient has finished  involved field radiation therapy(February, 2015) 8. repeat PET scan in July 2 016 negative for any malignancy     Oncology Flowsheet 09/09/2014  ondansetron (ZOFRAN-ODT) PO 8 mg    INTERVAL HISTORY: 34 year old lady with recurrent Hodgkin's disease came today further follow-up.  Overall feeling better.  No chills.  No fever.  Patient occasionally has crampy pain in mid abdominal area. REVIEW OF SYSTEMS:   GENERAL:  Feels good.  Active.  No fevers, sweats or weight loss. PERFORMANCE STATUS (ECOG):  0 HEENT:  No visual changes, runny nose, sore throat, mouth sores or tenderness. Lungs: No shortness of breath or cough.  No hemoptysis. Cardiac:  No chest pain, palpitations, orthopnea, or PND. GI:  No nausea, vomiting, diarrhea, constipation, melena or hematochezia. Occasional abdominal discomfort GU:  No urgency, frequency, dysuria, or hematuria. Musculoskeletal:  No back pain.  No joint pain.  No muscle  tenderness. Extremities:  No pain or swelling. Skin:  No rashes or skin changes. Neuro:  No headache, numbness or weakness, balance or coordination issues. Endocrine:  No diabetes, thyroid issues, hot flashes or night sweats. Psych:  No mood changes, depression or anxiety. Pain:  No focal pain. Review of systems:  All other systems reviewed and found to be negative. As per HPI. Otherwise, a complete review of systems is negatve.  PAST MEDICAL HISTORY: Past Medical History  Diagnosis Date  . Hodgkin disease 2001 and 2014    chemo and rad tx in 2001 and 2014  . Lymphoma, Hodgkin's     2001, 2014    PAST SURGICAL HISTORY: Past Surgical History  Procedure Laterality Date  . Thyroidectomy  10-13-12  . Port-a-cath removal  2004  . Thyroid surgery    . Portacath placement  11-24-12    FAMILY HISTORY Family History  Problem Relation Age of Onset  . Breast cancer Paternal Aunt     great aunt >50  . Breast cancer Cousin     ADVANCED DIRECTIVES:  No flowsheet data found.  HEALTH MAINTENANCE: History  Substance Use Topics  . Smoking status: Former Smoker -- 1.00 packs/day for 2 years  . Smokeless tobacco: Never Used  . Alcohol Use: Not on file      Allergies  Allergen Reactions  . Tape Rash    Uncoded Allergy. Allergen: Msg  . Iodinated Diagnostic Agents Hives    Uncoded Allergy. Allergen: CATS  . Barium-Containing Compounds Hives  . Other Rash    Contrast dye    OBJECTIVE:  Filed Vitals:   09/05/14 1632  BP: 140/84  Pulse: 109  Temp: 98.6 F (37 C)  Resp: 18     Body mass index is 43.86 kg/(m^2).    ECOG FS:0 - Asymptomatic  PHYSICAL EXAM: Goal status: Performance status is good.  Patient has not lost significant weight Patient is moderately obese with a BMI of 43 HEENT: No evidence of stomatitis. Sclera and conjunctivae :: No jaundice.   pale looking. Lungs: Air  entry equal on both sides.  No rhonchi.  No rales.  Cardiac: Heart sounds are normal.  No  pericardial rub.  No murmur. Lymphatic system: Cervical, axillary, inguinal, lymph nodes not palpable GI: Abdomen is soft.  No ascites.  Liver spleen not palpable.  No tenderness.  Bowel sounds are within normal limit Lower extremity: No edema Neurological system: Higher functions, cranial nerves intact no evidence of peripheral neuropathy. Skin: No rash.  No ecchymosis.Marland Kitchen Lymphatic system: Supraclavicular, cervical, axillary, inguinal lymph nodes are not palpable   LAB RESULTS:  Appointment on 09/05/2014  Component Date Value Ref Range Status  . WBC 09/05/2014 11.5* 3.6 - 11.0 K/uL Final  . RBC 09/05/2014 4.73  3.80 - 5.20 MIL/uL Final  . Hemoglobin 09/05/2014 11.4* 12.0 - 16.0 g/dL Final  . HCT 09/05/2014 35.2  35.0 - 47.0 % Final  . MCV 09/05/2014 74.4* 80.0 - 100.0 fL Final  . MCH 09/05/2014 24.0* 26.0 - 34.0 pg Final  . MCHC 09/05/2014 32.3  32.0 - 36.0 g/dL Final  . RDW 09/05/2014 15.6* 11.5 - 14.5 % Final  . Platelets 09/05/2014 296  150 - 440 K/uL Final  . Neutrophils Relative % 09/05/2014 76   Final  . Neutro Abs 09/05/2014 8.7* 1.4 - 6.5 K/uL Final  . Lymphocytes Relative 09/05/2014 15   Final  . Lymphs Abs 09/05/2014 1.8  1.0 - 3.6 K/uL Final  . Monocytes Relative 09/05/2014 6   Final  . Monocytes Absolute 09/05/2014 0.7  0.2 - 0.9 K/uL Final  . Eosinophils Relative 09/05/2014 2   Final  . Eosinophils Absolute 09/05/2014 0.2  0 - 0.7 K/uL Final  . Basophils Relative 09/05/2014 1   Final  . Basophils Absolute 09/05/2014 0.1  0 - 0.1 K/uL Final  . Sodium 09/05/2014 135  135 - 145 mmol/L Final  . Potassium 09/05/2014 3.5  3.5 - 5.1 mmol/L Final  . Chloride 09/05/2014 101  101 - 111 mmol/L Final  . CO2 09/05/2014 27  22 - 32 mmol/L Final  . Glucose, Bld 09/05/2014 118* 65 - 99 mg/dL Final  . BUN 09/05/2014 16  6 - 20 mg/dL Final  . Creatinine, Ser 09/05/2014 0.76  0.44 - 1.00 mg/dL Final  . Calcium 09/05/2014 7.7* 8.9 - 10.3 mg/dL Final  . Total Protein 09/05/2014 7.4   6.5 - 8.1 g/dL Final  . Albumin 09/05/2014 3.9  3.5 - 5.0 g/dL Final  . AST 09/05/2014 21  15 - 41 U/L Final  . ALT 09/05/2014 20  14 - 54 U/L Final  . Alkaline Phosphatase 09/05/2014 61  38 - 126 U/L Final  . Total Bilirubin 09/05/2014 0.4  0.3 - 1.2 mg/dL Final  . GFR calc non Af Amer 09/05/2014 >60  >60 mL/min Final  . GFR calc Af Amer 09/05/2014 >60  >60 mL/min Final   Comment: (NOTE) The eGFR has been calculated using the CKD EPI equation. This calculation has not been validated in all clinical situations. eGFR's persistently <60 mL/min signify possible Chronic Kidney Disease.   . Anion gap 09/05/2014 7  5 - 15 Final  . LDH 09/05/2014 128  98 - 192 U/L Final      STUDIES: Nm Pet Image Restag (ps) Skull Base To Thigh  08/31/2014   CLINICAL DATA:  Subsequent treatment strategy for Hodgkin's lymphoma.  EXAM: NUCLEAR MEDICINE PET SKULL BASE TO THIGH  TECHNIQUE: 12.88 mCi F-18 FDG was injected intravenously. Full-ring PET imaging was performed from the skull base to thigh after the radiotracer. CT data was obtained and used for attenuation correction and anatomic localization.  FASTING BLOOD GLUCOSE:  Value: 86 mg/dl  COMPARISON:  CT neck/chest dated 11/11/2013. PET-CT dated 05/06/2013.  FINDINGS: NECK  No hypermetabolic lymph nodes in the neck.  CHEST  Lungs are clear. No suspicious pulmonary nodules. No focal consolidation. No pleural effusion or pneumothorax.  Heart is normal in size. Left subclavian chest port terminating at the cavoatrial junction.  Calcified prevascular nodes (series 3/ image 95), without associated hypermetabolism, likely corresponding to treated lymphoma. No hypermetabolic thoracic lymphadenopathy.  ABDOMEN/PELVIS  No abnormal hypermetabolic activity within the liver, pancreas, adrenal glands, or spleen. Stable low-density thickening of the right adrenal gland (series 3/image 153), without associated hypermetabolism.  No hypermetabolic lymph nodes in the abdomen or  pelvis.  SKELETON  No focal hypermetabolic activity to suggest skeletal metastasis.  Mild hypermetabolism along the left lateral 4th rib, max SUV 2.6, favored to reflect heterogeneous marrow activity. Adjacent 3 mm sclerotic lesion in the left lateral 4th rib (series 3/ image 93) is chronic and favored to reflect a benign bone island. This appearance would be very unusual for solitary metastasis.  IMPRESSION: Calcified prevascular nodes, likely corresponding to treated lymphoma.  No findings suspicious for active lymphomatous involvement.   Electronically Signed   By: Julian Hy M.D.   On: 08/31/2014 14:41    ASSESSMENT: Recurrent Hodgkin's disease status post chemoradiation therapy There is no evidence of recurrent disease based on review of PET scan  MEDICAL DECISION MAKING:  PET scan has been reviewed independently.   Patient expressed understanding and was in agreement with this plan. She also understands that She can call clinic at any time with any questions, concerns, or complaints.    No matching staging information was found for the patient.  Forest Gleason, MD   09/10/2014 4:05 PM

## 2014-09-11 LAB — URINE CULTURE

## 2014-10-04 ENCOUNTER — Other Ambulatory Visit: Payer: Self-pay | Admitting: Family Medicine

## 2014-10-18 ENCOUNTER — Encounter: Payer: Self-pay | Admitting: Family Medicine

## 2014-10-18 DIAGNOSIS — C8190 Hodgkin lymphoma, unspecified, unspecified site: Secondary | ICD-10-CM | POA: Insufficient documentation

## 2014-10-18 DIAGNOSIS — F411 Generalized anxiety disorder: Secondary | ICD-10-CM | POA: Insufficient documentation

## 2014-10-18 DIAGNOSIS — L509 Urticaria, unspecified: Secondary | ICD-10-CM | POA: Insufficient documentation

## 2014-10-18 DIAGNOSIS — E668 Other obesity: Secondary | ICD-10-CM | POA: Insufficient documentation

## 2014-10-18 DIAGNOSIS — E559 Vitamin D deficiency, unspecified: Secondary | ICD-10-CM | POA: Insufficient documentation

## 2014-10-18 DIAGNOSIS — K5900 Constipation, unspecified: Secondary | ICD-10-CM | POA: Insufficient documentation

## 2014-10-18 DIAGNOSIS — L219 Seborrheic dermatitis, unspecified: Secondary | ICD-10-CM | POA: Insufficient documentation

## 2014-10-18 DIAGNOSIS — G2581 Restless legs syndrome: Secondary | ICD-10-CM | POA: Insufficient documentation

## 2014-10-18 DIAGNOSIS — E890 Postprocedural hypothyroidism: Secondary | ICD-10-CM | POA: Insufficient documentation

## 2014-10-18 DIAGNOSIS — G4700 Insomnia, unspecified: Secondary | ICD-10-CM | POA: Insufficient documentation

## 2014-10-18 DIAGNOSIS — K219 Gastro-esophageal reflux disease without esophagitis: Secondary | ICD-10-CM | POA: Insufficient documentation

## 2014-10-18 DIAGNOSIS — Z91018 Allergy to other foods: Secondary | ICD-10-CM | POA: Insufficient documentation

## 2014-10-18 DIAGNOSIS — G43009 Migraine without aura, not intractable, without status migrainosus: Secondary | ICD-10-CM | POA: Insufficient documentation

## 2014-10-18 DIAGNOSIS — E8881 Metabolic syndrome: Secondary | ICD-10-CM | POA: Insufficient documentation

## 2014-10-18 DIAGNOSIS — N915 Oligomenorrhea, unspecified: Secondary | ICD-10-CM | POA: Insufficient documentation

## 2014-10-18 DIAGNOSIS — M26629 Arthralgia of temporomandibular joint, unspecified side: Secondary | ICD-10-CM | POA: Insufficient documentation

## 2014-10-18 DIAGNOSIS — N979 Female infertility, unspecified: Secondary | ICD-10-CM | POA: Insufficient documentation

## 2014-10-18 DIAGNOSIS — N946 Dysmenorrhea, unspecified: Secondary | ICD-10-CM | POA: Insufficient documentation

## 2014-10-18 DIAGNOSIS — F339 Major depressive disorder, recurrent, unspecified: Secondary | ICD-10-CM | POA: Insufficient documentation

## 2014-10-18 DIAGNOSIS — Z9009 Acquired absence of other part of head and neck: Secondary | ICD-10-CM | POA: Insufficient documentation

## 2014-10-20 ENCOUNTER — Ambulatory Visit (INDEPENDENT_AMBULATORY_CARE_PROVIDER_SITE_OTHER): Payer: 59 | Admitting: Family Medicine

## 2014-10-20 ENCOUNTER — Encounter: Payer: Self-pay | Admitting: Family Medicine

## 2014-10-20 ENCOUNTER — Encounter (INDEPENDENT_AMBULATORY_CARE_PROVIDER_SITE_OTHER): Payer: Self-pay

## 2014-10-20 VITALS — BP 126/84 | HR 103 | Temp 97.8°F | Resp 16 | Ht 66.0 in | Wt 262.6 lb

## 2014-10-20 DIAGNOSIS — F329 Major depressive disorder, single episode, unspecified: Secondary | ICD-10-CM

## 2014-10-20 DIAGNOSIS — L219 Seborrheic dermatitis, unspecified: Secondary | ICD-10-CM

## 2014-10-20 DIAGNOSIS — K219 Gastro-esophageal reflux disease without esophagitis: Secondary | ICD-10-CM

## 2014-10-20 DIAGNOSIS — M7661 Achilles tendinitis, right leg: Secondary | ICD-10-CM

## 2014-10-20 DIAGNOSIS — G43009 Migraine without aura, not intractable, without status migrainosus: Secondary | ICD-10-CM | POA: Diagnosis not present

## 2014-10-20 DIAGNOSIS — N944 Primary dysmenorrhea: Secondary | ICD-10-CM | POA: Diagnosis not present

## 2014-10-20 DIAGNOSIS — R739 Hyperglycemia, unspecified: Secondary | ICD-10-CM

## 2014-10-20 DIAGNOSIS — Z23 Encounter for immunization: Secondary | ICD-10-CM

## 2014-10-20 DIAGNOSIS — F419 Anxiety disorder, unspecified: Secondary | ICD-10-CM | POA: Diagnosis not present

## 2014-10-20 DIAGNOSIS — E669 Obesity, unspecified: Secondary | ICD-10-CM

## 2014-10-20 DIAGNOSIS — F32A Depression, unspecified: Secondary | ICD-10-CM

## 2014-10-20 DIAGNOSIS — E668 Other obesity: Secondary | ICD-10-CM

## 2014-10-20 DIAGNOSIS — G629 Polyneuropathy, unspecified: Secondary | ICD-10-CM

## 2014-10-20 MED ORDER — DULOXETINE HCL 30 MG PO CPEP: 90.0000 mg | ORAL_CAPSULE | Freq: Every day | ORAL | Status: DC

## 2014-10-20 MED ORDER — VICODIN 5-300 MG PO TABS: 1.0000 | ORAL_TABLET | Freq: Two times a day (BID) | ORAL | Status: DC

## 2014-10-20 MED ORDER — ALPRAZOLAM ER 1 MG PO TB24: 1.0000 mg | ORAL_TABLET | Freq: Every day | ORAL | Status: DC

## 2014-10-20 MED ORDER — OMEPRAZOLE MAGNESIUM 20 MG PO TBEC: 20.0000 mg | DELAYED_RELEASE_TABLET | Freq: Every day | ORAL | Status: DC

## 2014-10-20 MED ORDER — ROPINIROLE HCL 0.5 MG PO TABS: 0.5000 mg | ORAL_TABLET | Freq: Every day | ORAL | Status: DC

## 2014-10-20 MED ORDER — DESONIDE 0.05 % EX CREA: TOPICAL_CREAM | Freq: Two times a day (BID) | CUTANEOUS | Status: DC

## 2014-10-20 MED ORDER — ALPRAZOLAM 0.5 MG PO TABS: 0.5000 mg | ORAL_TABLET | Freq: Every evening | ORAL | Status: DC | PRN

## 2014-10-20 NOTE — Progress Notes (Signed)
Name: Jo [name]   MRN: [id number]    DOB: 1980-11-17   Date:10/20/2014       Progress Note  Subjective  Chief Complaint  Chief Complaint  Patient presents with  . Medication Refill    follow-up  . Depression  . Anxiety    pt states its been up lately   . Hypothyroidism    increased fatigue and hair loss  . Numbness    bilateral feet and legs onset off and on for past 2 years and has been increasing  . Foot Pain    right heel pain onset 2 months    HPI   Depression/Anxiety: she is taking Cymbalta and Alprazolam, she states she gets sad this time of the year , wants to have a child, but because of fertility problems it makes her struggler before the holidays.   Primary Dysmenorrhea: she has been having cycles again, and cramping is severe , needs refill of Hydrocodone, takes it prn, #30 pills to last 5-6 months.   Paresthesia: since last chemo , 2 years ago she has a crawling sensation on both legs and feet, sometimes some shaking feeling on her legs. Only on Duloxetine but not controlling symptoms. Symptoms are improving since chemo therapy, but still present. She states at one point Requip helped and would like to try it again  Achilles Tendinitis: started two months ago, no change in her routine, she has been stretching and changed shoes not to apply pressure on achilles tendon.   Obesity: lost 9 lbs since seen by Dr. Jeb Levering in July, better portion control, drinking water, walking at least 30 minutes daily and also using home gym at home. Also eating earlier now.   Patient Active Problem List   Diagnosis Date Noted  . Anxiety 10/18/2014  . Cramps of lower extremity 10/18/2014  . Insomnia, persistent 10/18/2014  . Migraine without aura and responsive to treatment 10/18/2014  . CN (constipation) 10/18/2014  . Clinical depression 10/18/2014  . Dysmenorrhea 10/18/2014  . Female infertility 10/18/2014  . Gastro-esophageal reflux disease without esophagitis 10/18/2014  .  Allergy, food 10/18/2014  . H/O Hodgkin's lymphoma 10/18/2014  . H/O thyroidectomy 10/18/2014  . Blood glucose elevated 10/18/2014  . Extreme obesity 10/18/2014  . Infrequent menses 10/18/2014  . Low parathyroid level after removal 10/18/2014  . Vitamin D deficiency 10/18/2014  . Hive 10/18/2014  . Dermatitis seborrheica 10/18/2014  . Arthralgia of temporomandibular joint 10/18/2014  . Hodgkin's lymphoma 11/19/2012  . Acquired hypothyroidism 10/13/2012    Past Surgical History  Procedure Laterality Date  . Thyroidectomy  10-13-12  . Port-a-cath removal  2004  . Thyroid surgery    . Portacath placement  11-24-12  . Lymphonodi biopsy    . Uterine poly removal Left 08/2010    [facility]    Family History  Problem Relation Age of Onset  . Breast cancer Paternal Aunt     great aunt >50  . Breast cancer Cousin   . Hypertension Mother   . Diabetes Father   . Heart disease Father   . CAD Father   . Diabetes Brother   . CAD Maternal Grandmother   . Parkinson's disease Maternal Grandmother   . Parkinson's disease Maternal Grandfather   . CAD Paternal Grandmother     Social History   Social History  . Marital Status: Married    Spouse Name: N/A  . Number of Children: N/A  . Years of Education: N/A   Occupational History  .  Not on file.   Social History Main Topics  . Smoking status: Former Smoker -- 1.00 packs/day for 2 years  . Smokeless tobacco: Never Used  . Alcohol Use: 0.0 oz/week    0 Standard drinks or equivalent per week     Comment: rarely  . Drug Use: No  . Sexual Activity: Yes   Other Topics Concern  . Not on file   Social History Narrative     Current outpatient prescriptions:  .  ALPRAZolam (XANAX XR) 1 MG 24 hr tablet, Take 1 tablet (1 mg total) by mouth daily., Disp: 30 tablet, Rfl: 2 .  ALPRAZolam (XANAX) 0.5 MG tablet, Take 1 tablet (0.5 mg total) by mouth at bedtime as needed., Disp: 30 tablet, Rfl: 0 .  Biotin 1 MG CAPS, Take by mouth., Disp: ,  Rfl:  .  calcitRIOL (ROCALTROL) 0.5 MCG capsule, Take by mouth., Disp: , Rfl:  .  calcium acetate (PHOSLO) 667 MG tablet, Take by mouth., Disp: , Rfl:  .  desonide (DESOWEN) 0.05 % cream, Apply topically 2 (two) times daily., Disp: 60 g, Rfl: 2 .  DULoxetine (CYMBALTA) 30 MG capsule, Take 3 capsules (90 mg total) by mouth daily., Disp: 90 capsule, Rfl: 2 .  EPINEPHrine 0.3 mg/0.3 mL IJ SOAJ injection, , Disp: , Rfl:  .  fexofenadine (ALLEGRA) 180 MG tablet, Take by mouth., Disp: , Rfl:  .  hydrOXYzine (ATARAX/VISTARIL) 25 MG tablet, Take 25 mg by mouth every 6 (six) hours as needed. , Disp: , Rfl:  .  omeprazole (PRILOSEC OTC) 20 MG tablet, Take 1 tablet (20 mg total) by mouth daily., Disp: 30 tablet, Rfl: 5 .  ondansetron (ZOFRAN) 4 MG tablet, , Disp: , Rfl:  .  SYNTHROID 200 MCG tablet, , Disp: , Rfl:  .  VICODIN 5-300 MG TABS, Take 1 tablet by mouth 2 (two) times daily., Disp: 30 each, Rfl: 0 .  Vitamin D, Ergocalciferol, (DRISDOL) 50000 UNITS CAPS capsule, , Disp: , Rfl:   Allergies  Allergen Reactions  . Tape Rash    Uncoded Allergy. Allergen: Msg  . Iodinated Diagnostic Agents Hives    Uncoded Allergy. Allergen: CATS  . Barium-Containing Compounds Hives  . Monosodium Glutamate     Unknown.  . Other Rash    Contrast dye     ROS  Constitutional: Negative for fever , positive for weight change.  Respiratory: Negative for cough and shortness of breath.   Cardiovascular: Negative for chest pain or palpitations.  Gastrointestinal: Negative for abdominal pain, no bowel changes.  Musculoskeletal: Negative for gait problem or joint swelling.  Skin:rash scalp - seborrhea Neurological: Negative for dizziness or headache.  No other specific complaints in a complete review of systems (except as listed in HPI above).  Objective  Filed Vitals:   10/20/14 1529  BP: 126/84  Pulse: 103  Temp: 97.8 F (36.6 C)  TempSrc: Oral  Resp: 16  Height: _0  (1.676 m)  Weight: 262 lb 9.6  oz (119.115 kg)  SpO2: 95%    Body mass index is 42.41 kg/(m^2).  Physical Exam  Constitutional: Patient appears well-developed and well-nourished. Obese No distress.  HEENT: head atraumatic, normocephalic, pupils equal and reactive to light,  neck supple, throat within normal limits Cardiovascular: Normal rate, regular rhythm and normal heart sounds.  No murmur heard. No BLE edema. Pulmonary/Chest: Effort normal and breath sounds normal. No respiratory distress. Abdominal: Soft.  There is no tenderness. Psychiatric: Patient has a normal mood and affect.  behavior is normal. Judgment and thought content normal. Muscular Skeletal: pain during palpation of left achilles tendon, pain during movement of ankle.   Recent Results (from the past 2160 hour(s))  CBC with Differential     Status: Abnormal   Collection Time: 09/05/14  3:34 PM  Result Value Ref Range   WBC 11.5 (H) 3.6 - 11.0 K/uL   RBC 4.73 3.80 - 5.20 MIL/uL   Hemoglobin 11.4 (L) 12.0 - 16.0 g/dL   HCT 35.2 35.0 - 47.0 %   MCV 74.4 (L) 80.0 - 100.0 fL   MCH 24.0 (L) 26.0 - 34.0 pg   MCHC 32.3 32.0 - 36.0 g/dL   RDW 15.6 (H) 11.5 - 14.5 %   Platelets 296 150 - 440 K/uL   Neutrophils Relative % 76 %   Neutro Abs 8.7 (H) 1.4 - 6.5 K/uL   Lymphocytes Relative 15 %   Lymphs Abs 1.8 1.0 - 3.6 K/uL   Monocytes Relative 6 %   Monocytes Absolute 0.7 0.2 - 0.9 K/uL   Eosinophils Relative 2 %   Eosinophils Absolute 0.2 0 - 0.7 K/uL   Basophils Relative 1 %   Basophils Absolute 0.1 0 - 0.1 K/uL  Comprehensive metabolic panel     Status: Abnormal   Collection Time: 09/05/14  3:34 PM  Result Value Ref Range   Sodium 135 135 - 145 mmol/L   Potassium 3.5 3.5 - 5.1 mmol/L   Chloride 101 101 - 111 mmol/L   CO2 27 22 - 32 mmol/L   Glucose, Bld 118 (H) 65 - 99 mg/dL   BUN 16 6 - 20 mg/dL   Creatinine, Ser 0.76 0.44 - 1.00 mg/dL   Calcium 7.7 (L) 8.9 - 10.3 mg/dL   Total Protein 7.4 6.5 - 8.1 g/dL   Albumin 3.9 3.5 - 5.0 g/dL    AST 21 15 - 41 U/L   ALT 20 14 - 54 U/L   Alkaline Phosphatase 61 38 - 126 U/L   Total Bilirubin 0.4 0.3 - 1.2 mg/dL   GFR calc non Af Amer >60 >60 mL/min   GFR calc Af Amer >60 >60 mL/min    Comment: (NOTE) The eGFR has been calculated using the CKD EPI equation. This calculation has not been validated in all clinical situations. eGFR's persistently <60 mL/min signify possible Chronic Kidney Disease.    Anion gap 7 5 - 15  Lactate dehydrogenase     Status: None   Collection Time: 09/05/14  3:34 PM  Result Value Ref Range   LDH 128 98 - 192 U/L  Comprehensive metabolic panel     Status: Abnormal   Collection Time: 09/09/14 10:34 AM  Result Value Ref Range   Sodium 135 135 - 145 mmol/L   Potassium 3.7 3.5 - 5.1 mmol/L   Chloride 96 (L) 101 - 111 mmol/L   CO2 27 22 - 32 mmol/L   Glucose, Bld 91 65 - 99 mg/dL   BUN 14 6 - 20 mg/dL   Creatinine, Ser 0.68 0.44 - 1.00 mg/dL   Calcium 7.9 (L) 8.9 - 10.3 mg/dL   Total Protein 7.7 6.5 - 8.1 g/dL   Albumin 4.0 3.5 - 5.0 g/dL   AST 14 (L) 15 - 41 U/L   ALT 18 14 - 54 U/L   Alkaline Phosphatase 64 38 - 126 U/L   Total Bilirubin 0.2 (L) 0.3 - 1.2 mg/dL   GFR calc non Af Amer >60 >60 mL/min   GFR calc  Af Amer >60 >60 mL/min    Comment: (NOTE) The eGFR has been calculated using the CKD EPI equation. This calculation has not been validated in all clinical situations. eGFR's persistently <60 mL/min signify possible Chronic Kidney Disease.    Anion gap 12 5 - 15  CBC with Differential     Status: Abnormal   Collection Time: 09/09/14 10:34 AM  Result Value Ref Range   WBC 11.4 (H) 3.6 - 11.0 K/uL   RBC 4.87 3.80 - 5.20 MIL/uL   Hemoglobin 11.8 (L) 12.0 - 16.0 g/dL   HCT 36.0 35.0 - 47.0 %   MCV 73.9 (L) 80.0 - 100.0 fL   MCH 24.2 (L) 26.0 - 34.0 pg   MCHC 32.8 32.0 - 36.0 g/dL   RDW 15.7 (H) 11.5 - 14.5 %   Platelets 307 150 - 440 K/uL   Neutrophils Relative % 70 %   Neutro Abs 8.0 (H) 1.4 - 6.5 K/uL   Lymphocytes Relative 19 %    Lymphs Abs 2.1 1.0 - 3.6 K/uL   Monocytes Relative 7 %   Monocytes Absolute 0.8 0.2 - 0.9 K/uL   Eosinophils Relative 3 %   Eosinophils Absolute 0.3 0 - 0.7 K/uL   Basophils Relative 1 %   Basophils Absolute 0.1 0 - 0.1 K/uL  Lipase, blood     Status: Abnormal   Collection Time: 09/09/14 10:34 AM  Result Value Ref Range   Lipase 17 (L) 22 - 51 U/L  Amylase     Status: None   Collection Time: 09/09/14 10:34 AM  Result Value Ref Range   Amylase 38 28 - 100 U/L  hCG, quantitative, pregnancy     Status: None   Collection Time: 09/09/14 10:34 AM  Result Value Ref Range   hCG, Beta Chain, Quant, S <1 <5 mIU/mL    Comment:          GEST. AGE      CONC.  (mIU/mL)   <=1 WEEK        5 - 50     2 WEEKS       50 - 500     3 WEEKS       100 - 10,000     4 WEEKS     1,000 - 30,000     5 WEEKS     3,500 - 115,000   6-8 WEEKS     12,000 - 270,000    12 WEEKS     15,000 - 220,000        FEMALE AND NON-PREGNANT FEMALE:     LESS THAN 5 mIU/mL   Urinalysis complete, with microscopic     Status: Abnormal   Collection Time: 09/09/14 10:34 AM  Result Value Ref Range   Color, Urine YELLOW YELLOW   APPearance CLEAR CLEAR   Glucose, UA NEGATIVE NEGATIVE mg/dL   Bilirubin Urine NEGATIVE NEGATIVE   Ketones, ur NEGATIVE NEGATIVE mg/dL   Specific Gravity, Urine 1.020 1.005 - 1.030   Hgb urine dipstick 2+ (A) NEGATIVE   pH 5.5 5.0 - 8.0   Protein, ur NEGATIVE NEGATIVE mg/dL   Nitrite NEGATIVE NEGATIVE   Leukocytes, UA TRACE (A) NEGATIVE   RBC / HPF 6-30 <3 RBC/hpf   WBC, UA 0-5 <3 WBC/hpf   Bacteria, UA FEW (A) RARE   Squamous Epithelial / LPF 6-30 (A) RARE  Urine culture     Status: None   Collection Time: 09/09/14 10:34 AM  Result Value Ref Range  Specimen Description URINE, CLEAN CATCH    Special Requests NONE    Culture MULTIPLE SPECIES PRESENT, SUGGEST RECOLLECTION    Report Status 09/11/2014 FINAL     PHQ2/9: Depression screen PHQ 2/9 10/20/2014  Decreased Interest 0  Down,  Depressed, Hopeless 2  PHQ - 2 Score 2  Altered sleeping 0  Tired, decreased energy 1  Change in appetite 0  Feeling bad or failure about yourself  1  Trouble concentrating 1  Moving slowly or fidgety/restless 0  Suicidal thoughts 0  PHQ-9 Score 5  Difficult doing work/chores Not difficult at all     Fall Risk: Fall Risk  10/20/2014 10/20/2014  Falls in the past year? No No      Functional Status Survey: Is the patient deaf or have difficulty hearing?: No Does the patient have difficulty seeing, even when wearing glasses/contacts?: No Does the patient have difficulty concentrating, remembering, or making decisions?: No Does the patient have difficulty walking or climbing stairs?: No Does the patient have difficulty dressing or bathing?: No Does the patient have difficulty doing errands alone such as visiting a doctor's office or shopping?: No    Assessment & Plan  1. Gastro-esophageal reflux disease without esophagitis Well controlled - omeprazole (PRILOSEC OTC) 20 MG tablet; Take 1 tablet (20 mg total) by mouth daily.  Dispense: 30 tablet; Refill: 5  2. Needs flu shot  - Flu Vaccine QUAD 36+ mos PF IM (Fluarix & Fluzone Quad PF)- refused  3. Extreme obesity Discussed with the patient the risk posed by an increased BMI. Discussed importance of portion control, calorie counting and at least 150 minutes of physical activity weekly. Avoid sweet beverages and drink more water. Eat at least 6 servings of fruit and vegetables daily   4. Blood glucose elevated Prediabetes, labs done at work was 6.1%  5. Clinical depression Continue medication, discussed counseling - DULoxetine (CYMBALTA) 30 MG capsule; Take 3 capsules (90 mg total) by mouth daily.  Dispense: 90 capsule; Refill: 2  6. Migraine without aura and responsive to treatment Doing well at this time  7. Anxiety A little more irritable recently, but does not want to change medication  - ALPRAZolam (XANAX) 0.5 MG  tablet; Take 1 tablet (0.5 mg total) by mouth at bedtime as needed.  Dispense: 30 tablet; Refill: 0 - ALPRAZolam (XANAX XR) 1 MG 24 hr tablet; Take 1 tablet (1 mg total) by mouth daily.  Dispense: 30 tablet; Refill: 2 - DULoxetine (CYMBALTA) 30 MG capsule; Take 3 capsules (90 mg total) by mouth daily.  Dispense: 90 capsule; Refill: 2  8. Primary dysmenorrhea Continue prn medication  - VICODIN 5-300 MG TABS; Take 1 tablet by mouth 2 (two) times daily.  Dispense: 30 each; Refill: 0  9. Seborrheic dermatitis  - desonide (DESOWEN) 0.05 % cream; Apply topically 2 (two) times daily.  Dispense: 60 g; Refill: 2  10. Achilles tendinitis of right lower extremity Offered PT, but she wants to continue home bracing, exercises, different shoes and call back if needed. Taking otc Aleve  11. Peripheral neuropathy Discussed gabapenting or Lyrica but she wants to try Requip again - rOPINIRole (REQUIP) 0.5 MG tablet; Take 1 tablet (0.5 mg total) by mouth at bedtime.  Dispense: 30 tablet; Refill: 2

## 2014-12-06 ENCOUNTER — Other Ambulatory Visit: Payer: Self-pay

## 2014-12-06 ENCOUNTER — Inpatient Hospital Stay: Payer: 59 | Attending: Oncology

## 2014-12-06 DIAGNOSIS — F329 Major depressive disorder, single episode, unspecified: Secondary | ICD-10-CM

## 2014-12-06 DIAGNOSIS — L219 Seborrheic dermatitis, unspecified: Secondary | ICD-10-CM

## 2014-12-06 DIAGNOSIS — F32A Depression, unspecified: Secondary | ICD-10-CM

## 2014-12-06 DIAGNOSIS — F419 Anxiety disorder, unspecified: Secondary | ICD-10-CM

## 2014-12-06 MED ORDER — DULOXETINE HCL 30 MG PO CPEP: 90.0000 mg | ORAL_CAPSULE | Freq: Every day | ORAL | Status: DC

## 2014-12-06 MED ORDER — DESONIDE 0.05 % EX CREA: TOPICAL_CREAM | Freq: Two times a day (BID) | CUTANEOUS | Status: DC

## 2014-12-06 MED ORDER — SYNTHROID 200 MCG PO TABS: 200.0000 ug | ORAL_TABLET | Freq: Every day | ORAL | Status: DC

## 2014-12-21 ENCOUNTER — Other Ambulatory Visit: Payer: Self-pay | Admitting: Family Medicine

## 2014-12-21 NOTE — Telephone Encounter (Signed)
Sent to Dr. Sowles  

## 2015-01-02 ENCOUNTER — Ambulatory Visit: Payer: 59 | Attending: Radiation Oncology | Admitting: Radiation Oncology

## 2015-01-19 ENCOUNTER — Encounter: Payer: Self-pay | Admitting: Family Medicine

## 2015-01-19 ENCOUNTER — Ambulatory Visit (INDEPENDENT_AMBULATORY_CARE_PROVIDER_SITE_OTHER): Payer: 59 | Admitting: Family Medicine

## 2015-01-19 VITALS — BP 120/72 | HR 109 | Temp 98.1°F | Resp 16 | Ht 66.0 in | Wt 263.8 lb

## 2015-01-19 DIAGNOSIS — N944 Primary dysmenorrhea: Secondary | ICD-10-CM | POA: Diagnosis not present

## 2015-01-19 DIAGNOSIS — E039 Hypothyroidism, unspecified: Secondary | ICD-10-CM | POA: Diagnosis not present

## 2015-01-19 DIAGNOSIS — F411 Generalized anxiety disorder: Secondary | ICD-10-CM | POA: Diagnosis not present

## 2015-01-19 DIAGNOSIS — G2581 Restless legs syndrome: Secondary | ICD-10-CM

## 2015-01-19 DIAGNOSIS — N979 Female infertility, unspecified: Secondary | ICD-10-CM

## 2015-01-19 DIAGNOSIS — R8290 Unspecified abnormal findings in urine: Secondary | ICD-10-CM | POA: Diagnosis not present

## 2015-01-19 DIAGNOSIS — K219 Gastro-esophageal reflux disease without esophagitis: Secondary | ICD-10-CM

## 2015-01-19 DIAGNOSIS — F339 Major depressive disorder, recurrent, unspecified: Secondary | ICD-10-CM

## 2015-01-19 MED ORDER — DULOXETINE HCL 30 MG PO CPEP: 90.0000 mg | ORAL_CAPSULE | Freq: Every day | ORAL | Status: DC

## 2015-01-19 MED ORDER — ROPINIROLE HCL 0.5 MG PO TABS: 0.5000 mg | ORAL_TABLET | Freq: Every day | ORAL | Status: DC

## 2015-01-19 MED ORDER — VICODIN 5-300 MG PO TABS: 1.0000 | ORAL_TABLET | Freq: Two times a day (BID) | ORAL | Status: DC

## 2015-01-19 MED ORDER — SYNTHROID 200 MCG PO TABS: 100.0000 ug | ORAL_TABLET | Freq: Every day | ORAL | Status: DC

## 2015-01-19 MED ORDER — ALPRAZOLAM ER 1 MG PO TB24: 1.0000 mg | ORAL_TABLET | Freq: Every day | ORAL | Status: DC

## 2015-01-19 MED ORDER — METRONIDAZOLE 500 MG PO TABS: 500.0000 mg | ORAL_TABLET | Freq: Three times a day (TID) | ORAL | Status: DC

## 2015-01-19 NOTE — Progress Notes (Addendum)
Name: Jo [name]   MRN: [id number]    DOB: Jul 26, 1980   Date:01/19/2015       Progress Note  Subjective  Chief Complaint  Chief Complaint  Patient presents with  . Medication Refill    Follow-up. Patient wants refill of symbalta, xanax and vicodin. Wants to change the requip to a 90 day supply.  . Depression    Patient states it has been unstable  . Gastroesophageal Reflux    Going well  . Hypothyroidism    Not going too well, was elevated, went from low to high.    HPI  Major Depression recurrent: she continues to feel down. Symptoms have been getting worse. She is sad because of her fertility problems and one of her friend's is pregnant. Still has some jealous feelings about her brother that is blind and gets all the attention from her mother. Also feels lonely because her husband does not understand why she is so upset about not being able to have a baby. She has tried multiple medications and is afraid to change medications.   GAD: she takes Alprazolam XR daily and prn xanax immediate release. Still worries all the time, has problems sleeping, she has a short temper at times.   GERD: under control with medication, no heartburn or regurgitation.   Hypothyroidism: she sees Dr. Gabriel Carina and was supposed to change dose of synthroid to 150 mcg but was too expensive so she is back on 200 mcg daily, advised to alternate 100 mcg and 200 mcg and call Dr. Gabriel Carina about it.   Urine odor: she states it is very strong smell when she voids. She denies vaginal discharge. No dysuria, no hematuria or pruritus.   Female Fertility: she will follow up with [facility]  Primary dysmenorrhea: cycles regular again, still needs hydrocodone prn for pain  RLS: doing well with Requip qhs, she states leg movement has decreased with medication   Patient Active Problem List   Diagnosis Date Noted  . GAD (generalized anxiety disorder) 10/18/2014  . RLS (restless legs syndrome) 10/18/2014  . Insomnia,  persistent 10/18/2014  . Migraine without aura and responsive to treatment 10/18/2014  . CN (constipation) 10/18/2014  . Major depression, recurrent, chronic ([place]) 10/18/2014  . Dysmenorrhea 10/18/2014  . Female infertility 10/18/2014  . Gastro-esophageal reflux disease without esophagitis 10/18/2014  . Allergy, food 10/18/2014  . H/O Hodgkin's lymphoma 10/18/2014  . H/O thyroidectomy 10/18/2014  . Blood glucose elevated 10/18/2014  . Extreme obesity ([place]) 10/18/2014  . Low parathyroid level after removal ([place]) 10/18/2014  . Vitamin D deficiency 10/18/2014  . Hive 10/18/2014  . Dermatitis seborrheica 10/18/2014  . Arthralgia of temporomandibular joint 10/18/2014  . Hodgkin's lymphoma ([place]) 11/19/2012  . Acquired hypothyroidism 10/13/2012    Past Surgical History  Procedure Laterality Date  . Thyroidectomy  10-13-12  . Port-a-cath removal  2004  . Thyroid surgery    . Portacath placement  11-24-12  . Lymphonodi biopsy    . Uterine poly removal Left 08/2010    [facility]    Family History  Problem Relation Age of Onset  . Breast cancer Paternal Aunt     great aunt >50  . Breast cancer Cousin   . Hypertension Mother   . Diabetes Father   . Heart disease Father   . CAD Father   . Diabetes Brother   . CAD Maternal Grandmother   . Parkinson's disease Maternal Grandmother   . Parkinson's disease Maternal Grandfather   . CAD  Paternal Grandmother     Social History   Social History  . Marital Status: Married    Spouse Name: N/A  . Number of Children: N/A  . Years of Education: N/A   Occupational History  . Not on file.   Social History Main Topics  . Smoking status: Former Smoker -- 1.00 packs/day for 2 years  . Smokeless tobacco: Never Used  . Alcohol Use: 0.0 oz/week    0 Standard drinks or equivalent per week     Comment: rarely  . Drug Use: No  . Sexual Activity: Yes   Other Topics Concern  . Not on file   Social History Narrative     Current outpatient  prescriptions:  .  ALPRAZolam (XANAX XR) 1 MG 24 hr tablet, Take 1 tablet (1 mg total) by mouth daily., Disp: 30 tablet, Rfl: 2 .  ALPRAZolam (XANAX) 0.5 MG tablet, TAKE 1 TABLET BY MOUTH AT BEDTIME AS NEEDED, Disp: 30 tablet, Rfl: 0 .  Biotin 1 MG CAPS, Take by mouth., Disp: , Rfl:  .  calcium acetate (PHOSLO) 667 MG tablet, Take by mouth., Disp: , Rfl:  .  desonide (DESOWEN) 0.05 % cream, Apply topically 2 (two) times daily., Disp: 60 g, Rfl: 2 .  DULoxetine (CYMBALTA) 30 MG capsule, Take 3 capsules (90 mg total) by mouth daily., Disp: 270 capsule, Rfl: 1 .  EPINEPHrine 0.3 mg/0.3 mL IJ SOAJ injection, , Disp: , Rfl:  .  fexofenadine (ALLEGRA) 180 MG tablet, Take by mouth., Disp: , Rfl:  .  hydrOXYzine (ATARAX/VISTARIL) 25 MG tablet, Take 25 mg by mouth every 6 (six) hours as needed. , Disp: , Rfl:  .  omeprazole (PRILOSEC OTC) 20 MG tablet, Take 1 tablet (20 mg total) by mouth daily., Disp: 30 tablet, Rfl: 5 .  ondansetron (ZOFRAN) 4 MG tablet, , Disp: , Rfl:  .  rOPINIRole (REQUIP) 0.5 MG tablet, Take 1 tablet (0.5 mg total) by mouth at bedtime., Disp: 30 tablet, Rfl: 2 .  SYNTHROID 200 MCG tablet, Take 1 tablet (200 mcg total) by mouth daily before breakfast., Disp: 90 tablet, Rfl: 0 .  VICODIN 5-300 MG TABS, Take 1 tablet by mouth 2 (two) times daily., Disp: 30 each, Rfl: 0  Allergies  Allergen Reactions  . Tape Rash    Uncoded Allergy. Allergen: Msg  . Iodinated Diagnostic Agents Hives    Uncoded Allergy. Allergen: CATS  . Barium-Containing Compounds Hives  . Monosodium Glutamate     Unknown.  . Other Rash    Contrast dye     ROS  Constitutional: Negative for fever or weight change.  Respiratory: Negative for cough and shortness of breath.   Cardiovascular: Negative for chest pain or palpitations.  Gastrointestinal: Negative for abdominal pain, no bowel changes.  Musculoskeletal: Negative for gait problem or joint swelling.  Skin: Negative for rash.  Neurological:  Negative for dizziness or headache.  No other specific complaints in a complete review of systems (except as listed in HPI above).   Objective  Filed Vitals:   01/19/15 1531  BP: 120/72  Pulse: 109  Temp: 98.1 F (36.7 C)  TempSrc: Oral  Resp: 16  Height: 5\' 6"  (1.676 m)  Weight: 263 lb 12.8 oz (119.659 kg)  SpO2: 97%    Body mass index is 42.6 kg/(m^2).  Physical Exam Constitutional: Patient appears well-developed ObeseNo distress.  HEENT: head atraumatic, normocephalic, pupils equal and reactive to light,  neck supple, throat within normal limits Cardiovascular: Normal ratetachycardia  and normal heart sounds.  No murmur heard. No BLE edema. Pulmonary/Chest: Effort normal and breath sounds normal. No respiratory distress. Abdominal: Soft.  There is no tenderness. Psychiatric: Patient has a normal mood and affect. behavior is normal. Judgment and thought content normal. Cried a bit, but after that back to normal  PHQ2/9: Depression screen [facility] 2/9 01/19/2015 10/20/2014  Decreased Interest 0 0  Down, Depressed, Hopeless 1 2  PHQ - 2 Score 1 2  Altered sleeping - 0  Tired, decreased energy - 1  Change in appetite - 0  Feeling bad or failure about yourself  - 1  Trouble concentrating - 1  Moving slowly or fidgety/restless - 0  Suicidal thoughts - 0  PHQ-9 Score - 5  Difficult doing work/chores - Not difficult at all     Fall Risk: Fall Risk  01/19/2015 10/20/2014 10/20/2014  Falls in the past year? No No No    Functional Status Survey: Is the patient deaf or have difficulty hearing?: No Does the patient have difficulty seeing, even when wearing glasses/contacts?: No Does the patient have difficulty concentrating, remembering, or making decisions?: Yes Does the patient have difficulty walking or climbing stairs?: No Does the patient have difficulty dressing or bathing?: No Does the patient have difficulty doing errands alone such as visiting a doctor's office or shopping?:  No   Assessment & Plan  1. Major depression, recurrent, chronic (HCC)  Discussed change in medication and referral to counselor, she will call her insurance to decide who she will see. She does not want to change medication   2. Gastro-esophageal reflux disease without esophagitis  Continue medication   3. Female infertility  Go back to [place] [facility]  4. GAD (generalized anxiety disorder)  - ALPRAZolam (XANAX XR) 1 MG 24 hr tablet; Take 1 tablet (1 mg total) by mouth daily.  Dispense: 30 tablet; Refill: 2 - DULoxetine (CYMBALTA) 30 MG capsule; Take 3 capsules (90 mg total) by mouth daily.  Dispense: 270 capsule; Refill: 1  5. Acquired hypothyroidism  Continue follow up with Dr. Gabriel Carina , TSH was suppressed, and medication was adjusted but still taking 262mcg, likely the cause of tachycardia  6. Primary dysmenorrhea  - VICODIN 5-300 MG TABS; Take 1 tablet by mouth 2 (two) times daily.  Dispense: 30 each; Refill: 0  7. RLS (restless legs syndrome)  - rOPINIRole (REQUIP) 0.5 MG tablet; Take 1 tablet (0.5 mg total) by mouth at bedtime.  Dispense: 90 tablet; Refill: 1  8. Abnormal urine odor  - Urine culture - Bacterial Vaginosis DNA - metroNIDAZOLE (FLAGYL) 500 MG tablet; Take 1 tablet (500 mg total) by mouth 3 (three) times daily.  Dispense: 21 tablet; Refill: 0

## 2015-01-19 NOTE — Addendum Note (Signed)
Addended by: Steele Sizer F on: 01/19/2015 04:30 PM   Modules accepted: Orders

## 2015-01-22 LAB — URINE CULTURE

## 2015-02-14 ENCOUNTER — Other Ambulatory Visit: Payer: Self-pay | Admitting: Family Medicine

## 2015-02-14 NOTE — Telephone Encounter (Signed)
Patient requesting refill. 

## 2015-02-15 ENCOUNTER — Other Ambulatory Visit: Payer: Self-pay

## 2015-02-15 DIAGNOSIS — K219 Gastro-esophageal reflux disease without esophagitis: Secondary | ICD-10-CM

## 2015-02-15 NOTE — Telephone Encounter (Signed)
Patient requesting refill. 

## 2015-02-16 MED ORDER — OMEPRAZOLE MAGNESIUM 20 MG PO TBEC: 20.0000 mg | DELAYED_RELEASE_TABLET | Freq: Every day | ORAL | Status: DC

## 2015-02-17 ENCOUNTER — Telehealth: Payer: Self-pay

## 2015-02-17 DIAGNOSIS — K219 Gastro-esophageal reflux disease without esophagitis: Secondary | ICD-10-CM

## 2015-02-17 MED ORDER — OMEPRAZOLE 20 MG PO CPDR
20.0000 mg | DELAYED_RELEASE_CAPSULE | Freq: Every day | ORAL | Status: DC
Start: 2015-02-17 — End: 2015-07-28

## 2015-02-17 NOTE — Telephone Encounter (Signed)
Insurance will not cover the tablets but will cover the capsules with her omeprazole 20 mg. Dr. Ancil Boozer ok' ed to changed prescription.

## 2015-02-28 ENCOUNTER — Other Ambulatory Visit: Payer: Self-pay | Admitting: Family Medicine

## 2015-02-28 NOTE — Telephone Encounter (Signed)
Patient requesting refill. 

## 2015-03-08 ENCOUNTER — Ambulatory Visit: Payer: 59

## 2015-03-08 ENCOUNTER — Encounter: Payer: Self-pay | Admitting: Oncology

## 2015-03-08 ENCOUNTER — Inpatient Hospital Stay: Payer: 59 | Attending: Oncology | Admitting: Oncology

## 2015-03-08 ENCOUNTER — Inpatient Hospital Stay: Payer: 59

## 2015-03-08 VITALS — BP 100/65 | HR 112 | Temp 98.7°F | Resp 112 | Wt 264.1 lb

## 2015-03-08 DIAGNOSIS — E785 Hyperlipidemia, unspecified: Secondary | ICD-10-CM

## 2015-03-08 DIAGNOSIS — C8190 Hodgkin lymphoma, unspecified, unspecified site: Secondary | ICD-10-CM

## 2015-03-08 DIAGNOSIS — Z923 Personal history of irradiation: Secondary | ICD-10-CM | POA: Insufficient documentation

## 2015-03-08 DIAGNOSIS — E669 Obesity, unspecified: Secondary | ICD-10-CM | POA: Diagnosis not present

## 2015-03-08 DIAGNOSIS — Z803 Family history of malignant neoplasm of breast: Secondary | ICD-10-CM | POA: Diagnosis not present

## 2015-03-08 DIAGNOSIS — Z87891 Personal history of nicotine dependence: Secondary | ICD-10-CM | POA: Diagnosis not present

## 2015-03-08 DIAGNOSIS — K219 Gastro-esophageal reflux disease without esophagitis: Secondary | ICD-10-CM

## 2015-03-08 DIAGNOSIS — Z9221 Personal history of antineoplastic chemotherapy: Secondary | ICD-10-CM | POA: Diagnosis not present

## 2015-03-08 DIAGNOSIS — D72829 Elevated white blood cell count, unspecified: Secondary | ICD-10-CM | POA: Insufficient documentation

## 2015-03-08 DIAGNOSIS — R109 Unspecified abdominal pain: Secondary | ICD-10-CM

## 2015-03-08 DIAGNOSIS — E890 Postprocedural hypothyroidism: Secondary | ICD-10-CM | POA: Diagnosis not present

## 2015-03-08 LAB — CBC WITH DIFFERENTIAL/PLATELET
Basophils Absolute: 0.2 10*3/uL — ABNORMAL HIGH (ref 0–0.1)
Basophils Relative: 1 %
EOS PCT: 2 %
Eosinophils Absolute: 0.3 10*3/uL (ref 0–0.7)
HCT: 36.6 % (ref 35.0–47.0)
HEMOGLOBIN: 12.1 g/dL (ref 12.0–16.0)
LYMPHS ABS: 2.3 10*3/uL (ref 1.0–3.6)
LYMPHS PCT: 18 %
MCH: 24.4 pg — AB (ref 26.0–34.0)
MCHC: 33.2 g/dL (ref 32.0–36.0)
MCV: 73.5 fL — AB (ref 80.0–100.0)
MONOS PCT: 8 %
Monocytes Absolute: 1 10*3/uL — ABNORMAL HIGH (ref 0.2–0.9)
Neutro Abs: 8.6 10*3/uL — ABNORMAL HIGH (ref 1.4–6.5)
Neutrophils Relative %: 71 %
Platelets: 337 10*3/uL (ref 150–440)
RBC: 4.97 MIL/uL (ref 3.80–5.20)
RDW: 16.5 % — ABNORMAL HIGH (ref 11.5–14.5)
WBC: 12.3 10*3/uL — AB (ref 3.6–11.0)

## 2015-03-08 LAB — COMPREHENSIVE METABOLIC PANEL
ALK PHOS: 59 U/L (ref 38–126)
ALT: 19 U/L (ref 14–54)
AST: 16 U/L (ref 15–41)
Albumin: 4.1 g/dL (ref 3.5–5.0)
Anion gap: 8 (ref 5–15)
BILIRUBIN TOTAL: 0.2 mg/dL — AB (ref 0.3–1.2)
BUN: 20 mg/dL (ref 6–20)
CO2: 26 mmol/L (ref 22–32)
CREATININE: 0.63 mg/dL (ref 0.44–1.00)
Calcium: 8 mg/dL — ABNORMAL LOW (ref 8.9–10.3)
Chloride: 99 mmol/L — ABNORMAL LOW (ref 101–111)
Glucose, Bld: 94 mg/dL (ref 65–99)
Potassium: 3.7 mmol/L (ref 3.5–5.1)
Sodium: 133 mmol/L — ABNORMAL LOW (ref 135–145)
TOTAL PROTEIN: 7.6 g/dL (ref 6.5–8.1)

## 2015-03-08 LAB — TSH: TSH: 2.497 u[IU]/mL (ref 0.350–4.500)

## 2015-03-08 LAB — LACTATE DEHYDROGENASE: LDH: 117 U/L (ref 98–192)

## 2015-03-08 MED ORDER — HEPARIN SOD (PORK) LOCK FLUSH 100 UNIT/ML IV SOLN
500.0000 [IU] | Freq: Once | INTRAVENOUS | Status: AC
  Administered 2015-03-08: 500 [IU] via INTRAVENOUS
  Filled 2015-03-08: qty 5

## 2015-03-08 MED ORDER — SODIUM CHLORIDE 0.9% FLUSH
10.0000 mL | INTRAVENOUS | Status: DC | PRN
  Administered 2015-03-08: 10 mL via INTRAVENOUS
  Filled 2015-03-08: qty 10

## 2015-03-08 NOTE — Progress Notes (Signed)
[place] @ [facility] [phone]  [phone]     VALLARIE FEI OB: January 29, 1981  MR#: [id number]  YQI#:[id number]  Patient Care Team: Steele Sizer, MD as PCP - General (Family Medicine) Seeplaputhur Robinette Haines, MD (General Surgery) Forest Gleason, MD (Unknown Physician Specialty)  CHIEF COMPLAINT:  Chief Complaint  Patient presents with  . Lymphoma    Chief Complaint/Diagnosis:   35 year old lady who had a recent ultrasound of the neck revealing lymphadenopathy.   Has a history of Hodgkin's disease treated when patient was 35 years old. 2.  Thyroid goiter 3.  Obesity 4.status post thyroidectomy Biopsy from nasopharyngeal area is benign.  Biopsy from the lymph node on the right side of the neck is consistent with Hodgkin's disease(  prelimary report from [facility]) 5.started on  BEACOPP from December 01, 2012 6.finished 2 cycles of  BEACOPP.  December of 2014 patient is now referred for radiation therapy 7.patient has finished  involved field radiation therapy(February, 2015) 8. repeat PET scan in July 2 016 negative for any malignancy     Oncology Flowsheet 09/09/2014  ondansetron (ZOFRAN-ODT) PO 8 mg    INTERVAL HISTORY: 35 year old lady with recurrent Hodgkin's disease came today further follow-up.  Overall feeling better.  No chills.  No fever.  Patient occasionally has crampy pain in mid abdominal area.  Patient is here for ongoing evaluation regarding Hodgkin's disease.  Patient continues to a problem with hypothyroidism and hypocalcemia being managed by endocrinologist. No other significant problem.  No nausea no vomiting no diarrhea REVIEW OF SYSTEMS:   GENERAL:  Feels good.  Active.  No fevers, sweats or weight loss. PERFORMANCE STATUS (ECOG):  0 HEENT:  No visual changes, runny nose, sore throat, mouth sores or tenderness. Lungs: No shortness of breath or cough.  No hemoptysis. Cardiac:  No chest pain, palpitations, orthopnea, or  PND. GI:  No nausea, vomiting, diarrhea, constipation, melena or hematochezia. Occasional abdominal discomfort GU:  No urgency, frequency, dysuria, or hematuria. Musculoskeletal:  No back pain.  No joint pain.  No muscle tenderness. Extremities:  No pain or swelling. Skin:  No rashes or skin changes. Neuro:  No headache, numbness or weakness, balance or coordination issues. Endocrine:  No diabetes, thyroid issues, hot flashes or night sweats. Psych:  No mood changes, depression or anxiety. Pain:  No focal pain. Review of systems:  All other systems reviewed and found to be negative. As per HPI. Otherwise, a complete review of systems is negatve.  PAST MEDICAL HISTORY: Past Medical History  Diagnosis Date  . Hodgkin disease ([place]) 2001 and 2014    chemo and rad tx in 2001 and 2014  . Lymphoma, Hodgkin's ([place])     2001, 2014  . Thyroid disease   . Hodgkin's disease in remission   . Anxiety   . GERD (gastroesophageal reflux disease)   . Depression   . Hives   . Cramp of limb   . Oligomenorrhea   . Seborrhea   . Obesity   . TMJ (dislocation of temporomandibular joint)   . Hyperlipidemia   . Vitamin D deficiency   . Migraine   . Screening mammogram for high-risk patient   . Primary female infertility   . Insomnia   . Snoring   . Constipation     PAST SURGICAL HISTORY: Past Surgical History  Procedure Laterality Date  . Thyroidectomy  10-13-12  . Port-a-cath removal  2004  . Thyroid surgery    . Portacath placement  11-24-12  .  Lymphonodi biopsy    . Uterine poly removal Left 08/2010    [facility]    FAMILY HISTORY Family History  Problem Relation Age of Onset  . Breast cancer Paternal Aunt     great aunt >50  . Breast cancer Cousin   . Hypertension Mother   . Diabetes Father   . Heart disease Father   . CAD Father   . Diabetes Brother   . CAD Maternal Grandmother   . Parkinson's disease Maternal Grandmother   . Parkinson's disease Maternal Grandfather   . CAD  Paternal Grandmother     ADVANCED DIRECTIVES:  No flowsheet data found.  HEALTH MAINTENANCE: Social History  Substance Use Topics  . Smoking status: Former Smoker -- 1.00 packs/day for 2 years  . Smokeless tobacco: Never Used  . Alcohol Use: 0.0 oz/week    0 Standard drinks or equivalent per week     Comment: rarely      Allergies  Allergen Reactions  . Tape Rash    Uncoded Allergy. Allergen: Msg  . Iodinated Diagnostic Agents Hives    Uncoded Allergy. Allergen: CATS  . Barium-Containing Compounds Hives  . Monosodium Glutamate     Unknown.  . Other Rash    Contrast dye    OBJECTIVE:  Filed Vitals:   03/08/15 1418  BP: 100/65  Pulse: 112  Temp: 98.7 F (37.1 C)  Resp: 112     Body mass index is 42.65 kg/(m^2).    ECOG FS:0 - Asymptomatic  PHYSICAL EXAM: Goal status: Performance status is good.  Patient has not lost significant weight Patient is moderately obese with a BMI of 43 HEENT: No evidence of stomatitis. Sclera and conjunctivae :: No jaundice.   pale looking. Lungs: Air  entry equal on both sides.  No rhonchi.  No rales.  Cardiac: Heart sounds are normal.  No pericardial rub.  No murmur. Lymphatic system: Cervical, axillary, inguinal, lymph nodes not palpable GI: Abdomen is soft.  No ascites.  Liver spleen not palpable.  No tenderness.  Bowel sounds are within normal limit Lower extremity: No edema Neurological system: Higher functions, cranial nerves intact no evidence of peripheral neuropathy. Skin: No rash.  No ecchymosis.Marland Kitchen Lymphatic system: Supraclavicular, cervical, axillary, inguinal lymph nodes are not palpable   LAB RESULTS:  Infusion on 03/08/2015  Component Date Value Ref Range Status  . WBC 03/08/2015 12.3* 3.6 - 11.0 K/uL Final  . RBC 03/08/2015 4.97  3.80 - 5.20 MIL/uL Final  . Hemoglobin 03/08/2015 12.1  12.0 - 16.0 g/dL Final  . HCT 03/08/2015 36.6  35.0 - 47.0 % Final  . MCV 03/08/2015 73.5* 80.0 - 100.0 fL Final  . MCH 03/08/2015  24.4* 26.0 - 34.0 pg Final  . MCHC 03/08/2015 33.2  32.0 - 36.0 g/dL Final  . RDW 03/08/2015 16.5* 11.5 - 14.5 % Final  . Platelets 03/08/2015 337  150 - 440 K/uL Final  . Neutrophils Relative % 03/08/2015 71   Final  . Neutro Abs 03/08/2015 8.6* 1.4 - 6.5 K/uL Final  . Lymphocytes Relative 03/08/2015 18   Final  . Lymphs Abs 03/08/2015 2.3  1.0 - 3.6 K/uL Final  . Monocytes Relative 03/08/2015 8   Final  . Monocytes Absolute 03/08/2015 1.0* 0.2 - 0.9 K/uL Final  . Eosinophils Relative 03/08/2015 2   Final  . Eosinophils Absolute 03/08/2015 0.3  0 - 0.7 K/uL Final  . Basophils Relative 03/08/2015 1   Final  . Basophils Absolute 03/08/2015 0.2* 0 -  0.1 K/uL Final  . Sodium 03/08/2015 133* 135 - 145 mmol/L Final  . Potassium 03/08/2015 3.7  3.5 - 5.1 mmol/L Final  . Chloride 03/08/2015 99* 101 - 111 mmol/L Final  . CO2 03/08/2015 26  22 - 32 mmol/L Final  . Glucose, Bld 03/08/2015 94  65 - 99 mg/dL Final  . BUN 03/08/2015 20  6 - 20 mg/dL Final  . Creatinine, Ser 03/08/2015 0.63  0.44 - 1.00 mg/dL Final  . Calcium 03/08/2015 8.0* 8.9 - 10.3 mg/dL Final  . Total Protein 03/08/2015 7.6  6.5 - 8.1 g/dL Final  . Albumin 03/08/2015 4.1  3.5 - 5.0 g/dL Final  . AST 03/08/2015 16  15 - 41 U/L Final  . ALT 03/08/2015 19  14 - 54 U/L Final  . Alkaline Phosphatase 03/08/2015 59  38 - 126 U/L Final  . Total Bilirubin 03/08/2015 0.2* 0.3 - 1.2 mg/dL Final  . GFR calc non Af Amer 03/08/2015 >60  >60 mL/min Final  . GFR calc Af Amer 03/08/2015 >60  >60 mL/min Final   Comment: (NOTE) The eGFR has been calculated using the CKD EPI equation. This calculation has not been validated in all clinical situations. eGFR's persistently <60 mL/min signify possible Chronic Kidney Disease.   . Anion gap 03/08/2015 8  5 - 15 Final  . LDH 03/08/2015 117  98 - 192 U/L Final      STUDIES: No results found.  ASSESSMENT: Recurrent Hodgkin's disease status post chemoradiation therapy On clinical  examination there is no evidence of recurrent or progressive disease. Repeat PET scan is being scheduled because of 2 different abnormality of calcified lymph node as well as some increased uptake in the rib if follow-up PET scan in July is reported to be negative and stable no further PET scan may be needed.  2.  All of the lab data has been reviewed.  Patient has been on 200 g of Synthroid in calcium and vitamin D being managed by endocrinologist 3.  There is leukocytosis which is persistent Is no evidence of infection at present time   Patient expressed understanding and was in agreement with this plan. She also understands that She can call clinic at any time with any questions, concerns, or complaints.    No matching staging information was found for the patient.  Forest Gleason, MD   03/08/2015 2:33 PM

## 2015-03-09 ENCOUNTER — Other Ambulatory Visit: Payer: Self-pay | Admitting: Family Medicine

## 2015-03-09 LAB — T4: T4, Total: 10.7 ug/dL (ref 4.5–12.0)

## 2015-03-09 NOTE — Telephone Encounter (Signed)
Patient requesting refill. 

## 2015-04-17 DIAGNOSIS — E209 Hypoparathyroidism, unspecified: Secondary | ICD-10-CM | POA: Insufficient documentation

## 2015-04-20 ENCOUNTER — Ambulatory Visit: Payer: 59 | Admitting: Family Medicine

## 2015-04-21 ENCOUNTER — Other Ambulatory Visit: Payer: Self-pay

## 2015-04-21 NOTE — Telephone Encounter (Signed)
Patient requesting refill. 

## 2015-04-22 MED ORDER — DULOXETINE HCL 30 MG PO CPEP: 90.0000 mg | ORAL_CAPSULE | Freq: Every day | ORAL | Status: DC

## 2015-04-27 ENCOUNTER — Encounter: Payer: Self-pay | Admitting: Family Medicine

## 2015-04-27 ENCOUNTER — Ambulatory Visit (INDEPENDENT_AMBULATORY_CARE_PROVIDER_SITE_OTHER): Payer: 59 | Admitting: Family Medicine

## 2015-04-27 VITALS — BP 116/64 | HR 110 | Temp 97.8°F | Resp 18 | Ht 66.0 in | Wt 265.9 lb

## 2015-04-27 DIAGNOSIS — N979 Female infertility, unspecified: Secondary | ICD-10-CM

## 2015-04-27 DIAGNOSIS — G44229 Chronic tension-type headache, not intractable: Secondary | ICD-10-CM | POA: Diagnosis not present

## 2015-04-27 DIAGNOSIS — F339 Major depressive disorder, recurrent, unspecified: Secondary | ICD-10-CM

## 2015-04-27 DIAGNOSIS — K219 Gastro-esophageal reflux disease without esophagitis: Secondary | ICD-10-CM | POA: Diagnosis not present

## 2015-04-27 DIAGNOSIS — E039 Hypothyroidism, unspecified: Secondary | ICD-10-CM

## 2015-04-27 DIAGNOSIS — N944 Primary dysmenorrhea: Secondary | ICD-10-CM | POA: Diagnosis not present

## 2015-04-27 DIAGNOSIS — F411 Generalized anxiety disorder: Secondary | ICD-10-CM | POA: Diagnosis not present

## 2015-04-27 DIAGNOSIS — G44209 Tension-type headache, unspecified, not intractable: Secondary | ICD-10-CM | POA: Insufficient documentation

## 2015-04-27 MED ORDER — ALPRAZOLAM ER 1 MG PO TB24: 1.0000 mg | ORAL_TABLET | Freq: Every day | ORAL | Status: DC

## 2015-04-27 MED ORDER — AMITRIPTYLINE HCL 25 MG PO TABS: 25.0000 mg | ORAL_TABLET | Freq: Every day | ORAL | Status: DC

## 2015-04-27 MED ORDER — VICODIN 5-300 MG PO TABS: 1.0000 | ORAL_TABLET | Freq: Two times a day (BID) | ORAL | Status: DC

## 2015-04-27 MED ORDER — ALPRAZOLAM 0.5 MG PO TABS: 0.5000 mg | ORAL_TABLET | Freq: Every evening | ORAL | Status: DC | PRN

## 2015-04-27 MED ORDER — SYNTHROID 200 MCG PO TABS: 200.0000 ug | ORAL_TABLET | Freq: Every day | ORAL | Status: DC

## 2015-04-27 NOTE — Addendum Note (Signed)
Addended by: Steele Sizer F on: 04/27/2015 11:53 AM   Modules accepted: Orders

## 2015-04-27 NOTE — Addendum Note (Signed)
Addended by: Vonna Kotyk L on: 04/27/2015 11:35 AM   Modules accepted: Orders

## 2015-04-27 NOTE — Progress Notes (Addendum)
Name: Jo [name]   MRN: [id number]    DOB: 1980/09/08   Date:04/27/2015       Progress Note  Subjective  Chief Complaint  Chief Complaint  Patient presents with  . Medication Refill    3 month F/U  . Gastroesophageal Reflux    Well controlled with medication  . Hypothyroidism    Cold Intolerance, Dry Skin   . Allergic Rhinitis     Controlled with taking Allegra daily for symptom control  . Depression    Seeing a therapist weekly and medication helps  . Neck Stiffness    Onset-After having radiation-2 years ago. Has not gone away, wanted to see if this was normal and having headache 3-4 weekly    HPI  Major Depression recurrent: she continues to feel down, but she is getting better, therapy has helped. Symptoms are stable. She is sad because of her fertility problems, would like to have a baby . She has tried multiple medications and is afraid to change medications.   Headaches: she has a dull aching that radiates from neck to frontal area, history of migraine headache, but does not feel like. No nausea, vomiting or photophobia. Almost daily but worse before her cycles. Taking otc Excedring and sometimes Hydrocodone.   GAD: she takes Alprazolam XR daily and prn xanax immediate release. Still worries all the time, has problems sleeping, she has a short temper at times.   GERD: under control with medication, no heartburn or regurgitation.   Hypothyroidism: she sees Dr. Gabriel Carina but states she would like to be monitored at our office, because of cost. She is currently on Synthroid 236mg, s/p thyroidectomy , last TSH done in Feb was at goal. She is has tachycardia. Recheck levels.   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 We will repeat labs  Female Fertility: she will follow up with [facility].  She is now ovulating. She tried Clomide in the past without success but she was not  ovulating at the time  Primary dysmenorrhea: cycles regular again, still needs hydrocodone prn for pain.  Patient Active Problem List   Diagnosis Date Noted  . GAD (generalized anxiety disorder) 10/18/2014  . RLS (restless legs syndrome) 10/18/2014  . Insomnia, persistent 10/18/2014  . Migraine without aura and responsive to treatment 10/18/2014  . CN (constipation) 10/18/2014  . Major depression, recurrent, chronic ([place] 10/18/2014  . Dysmenorrhea 10/18/2014  . Female infertility 10/18/2014  . Gastro-esophageal reflux disease without esophagitis 10/18/2014  . Allergy, food 10/18/2014  . H/O Hodgkin's lymphoma 10/18/2014  . H/O thyroidectomy 10/18/2014  . Blood glucose elevated 10/18/2014  . Extreme obesity ([place] 10/18/2014  . [place] ([place] 10/18/2014  . Vitamin D deficiency 10/18/2014  . Hive 10/18/2014  . Dermatitis seborrheica 10/18/2014  . Arthralgia of temporomandibular joint 10/18/2014  . [place]'s [place] ([place] 11/19/2012  . Acquired hypothyroidism 10/13/2012    Past Surgical History  Procedure Laterality Date  . Thyroidectomy  10-13-12  . Port-a-cath removal  2004  . Thyroid surgery    . Portacath placement  11-24-12  . Lymphonodi biopsy    . Uterine poly removal Left 08/2010    [facility]    Family History  Problem Relation Age of Onset  . Breast cancer Paternal Aunt     great aunt >50  . Breast cancer Cousin   . Hypertension Mother   . Diabetes Father   .  Heart disease Father   . CAD Father   . Diabetes Brother   . CAD Maternal Grandmother   . Parkinson's disease Maternal Grandmother   . Parkinson's disease Maternal Grandfather   . CAD Paternal Grandmother     Social History   Social History  . Marital Status: Married    Spouse Name: N/A  . Number of Children: N/A  . Years of Education: N/A   Occupational History  . Not on file.   Social History Main Topics  . Smoking status: Former Smoker -- 1.00 packs/day for 2 years   . Smokeless tobacco: Never Used  . Alcohol Use: 0.0 oz/week    0 Standard drinks or equivalent per week     Comment: rarely  . Drug Use: No  . Sexual Activity: Yes   Other Topics Concern  . Not on file   Social History Narrative     Current outpatient prescriptions:  .  ALPRAZolam (XANAX XR) 1 MG 24 hr tablet, Take 1 tablet (1 mg total) by mouth daily., Disp: 30 tablet, Rfl: 2 .  ALPRAZolam (XANAX) 0.5 MG tablet, Take 1 tablet (0.5 mg total) by mouth at bedtime as needed. for sleep, Disp: 30 tablet, Rfl: 0 .  Biotin 1 MG CAPS, Take by mouth., Disp: , Rfl:  .  calcitRIOL (ROCALTROL) 0.5 MCG capsule, , Disp: , Rfl:  .  calcium acetate (PHOSLO) 667 MG tablet, Take by mouth., Disp: , Rfl:  .  desonide (DESOWEN) 0.05 % cream, Apply two times daily, Disp: 180 g, Rfl: 0 .  DULoxetine (CYMBALTA) 30 MG capsule, Take 3 capsules (90 mg total) by mouth daily., Disp: 270 capsule, Rfl: 1 .  EPINEPHrine 0.3 mg/0.3 mL IJ SOAJ injection, , Disp: , Rfl:  .  fexofenadine (ALLEGRA) 180 MG tablet, Take by mouth., Disp: , Rfl:  .  hydrOXYzine (ATARAX/VISTARIL) 25 MG tablet, Take 25 mg by mouth every 6 (six) hours as needed. , Disp: , Rfl:  .  Mefenamic Acid 250 MG CAPS, , Disp: , Rfl:  .  omeprazole (PRILOSEC) 20 MG capsule, Take 1 capsule (20 mg total) by mouth daily., Disp: 90 capsule, Rfl: 1 .  rOPINIRole (REQUIP) 0.5 MG tablet, Take 1 tablet (0.5 mg total) by mouth at bedtime., Disp: 90 tablet, Rfl: 1 .  SYNTHROID 200 MCG tablet, Take 1 tablet (200 mcg total) by mouth daily before breakfast., Disp: 90 tablet, Rfl: 0  Allergies  Allergen Reactions  . Tape Rash    Uncoded Allergy. Allergen: Msg  . Iodinated Diagnostic Agents Hives    Uncoded Allergy. Allergen: CATS  . Barium-Containing Compounds Hives  . Monosodium Glutamate     Unknown.  . Other Rash    Contrast dye     ROS  Constitutional: Negative for fever or weight change.  Respiratory: Negative for cough and shortness of breath.    Cardiovascular: Negative for chest pain or palpitations.  Gastrointestinal: Negative for abdominal pain, no bowel changes.  Musculoskeletal: Negative for gait problem or joint swelling.  Skin: Negative for rash.  Neurological: Negative for dizziness, positive for  headache.  No other specific complaints in a complete review of systems (except as listed in HPI above).  Objective  Filed Vitals:   04/27/15 1037  BP: 116/64  Pulse: 110  Temp: 97.8 F (36.6 C)  TempSrc: Oral  Resp: 18  Height: '5\' 6"'  (1.676 m)  Weight: 265 lb 14.4 oz (120.611 kg)  SpO2: 96%    Body mass index is  42.94 kg/(m^2).  Physical Exam  Constitutional: Patient appears well-developed Obese o distress.  HEENT: head atraumatic, normocephalic, pupils equal and reactive to light, neck supple, throat within normal limits. S/p thyroidectomy Cardiovascular: Normal ratetachycardia and normal heart sounds. No murmur heard. No BLE edema. Pulmonary/Chest: Effort normal and breath sounds normal. No respiratory distress. Abdominal: Soft. There is no tenderness. Psychiatric: Patient has a normal mood and affect. behavior is normal. Judgment and thought content normal.   Recent Results (from the past 2160 hour(s))  CBC with Differential     Status: Abnormal   Collection Time: 03/08/15  1:49 PM  Result Value Ref Range   WBC 12.3 (H) 3.6 - 11.0 K/uL   RBC 4.97 3.80 - 5.20 MIL/uL   Hemoglobin 12.1 12.0 - 16.0 g/dL   HCT 36.6 35.0 - 47.0 %   MCV 73.5 (L) 80.0 - 100.0 fL   MCH 24.4 (L) 26.0 - 34.0 pg   MCHC 33.2 32.0 - 36.0 g/dL   RDW 16.5 (H) 11.5 - 14.5 %   Platelets 337 150 - 440 K/uL   Neutrophils Relative % 71 %   Neutro Abs 8.6 (H) 1.4 - 6.5 K/uL   Lymphocytes Relative 18 %   Lymphs Abs 2.3 1.0 - 3.6 K/uL   Monocytes Relative 8 %   Monocytes Absolute 1.0 (H) 0.2 - 0.9 K/uL   Eosinophils Relative 2 %   Eosinophils Absolute 0.3 0 - 0.7 K/uL   Basophils Relative 1 %   Basophils Absolute 0.2 (H) 0 - 0.1 K/uL   Comprehensive metabolic panel     Status: Abnormal   Collection Time: 03/08/15  1:49 PM  Result Value Ref Range   Sodium 133 (L) 135 - 145 mmol/L   Potassium 3.7 3.5 - 5.1 mmol/L   Chloride 99 (L) 101 - 111 mmol/L   CO2 26 22 - 32 mmol/L   Glucose, Bld 94 65 - 99 mg/dL   BUN 20 6 - 20 mg/dL   Creatinine, Ser 0.63 0.44 - 1.00 mg/dL   Calcium 8.0 (L) 8.9 - 10.3 mg/dL   Total Protein 7.6 6.5 - 8.1 g/dL   Albumin 4.1 3.5 - 5.0 g/dL   AST 16 15 - 41 U/L   ALT 19 14 - 54 U/L   Alkaline Phosphatase 59 38 - 126 U/L   Total Bilirubin 0.2 (L) 0.3 - 1.2 mg/dL   GFR calc non Af Amer >60 >60 mL/min   GFR calc Af Amer >60 >60 mL/min    Comment: (NOTE) The eGFR has been calculated using the CKD EPI equation. This calculation has not been validated in all clinical situations. eGFR's persistently <60 mL/min signify possible Chronic Kidney Disease.    Anion gap 8 5 - 15  Lactate dehydrogenase     Status: None   Collection Time: 03/08/15  1:49 PM  Result Value Ref Range   LDH 117 98 - 192 U/L  T4     Status: None   Collection Time: 03/08/15  1:49 PM  Result Value Ref Range   T4, Total 10.7 4.5 - 12.0 ug/dL    Comment: (NOTE) Performed At: [facility] [place], [place] [id number] Lindon Romp MD [id number]   TSH     Status: None   Collection Time: 03/08/15  1:49 PM  Result Value Ref Range   TSH 2.497 0.350 - 4.500 uIU/mL     PHQ2/9: Depression screen [facility] 2/9 01/19/2015 10/20/2014  Decreased Interest 0 0  Down,  Depressed, Hopeless 1 2  PHQ - 2 Score 1 2  Altered sleeping - 0  Tired, decreased energy - 1  Change in appetite - 0  Feeling bad or failure about yourself  - 1  Trouble concentrating - 1  Moving slowly or fidgety/restless - 0  Suicidal thoughts - 0  PHQ-9 Score - 5  Difficult doing work/chores - Not difficult at all    Fall Risk: Fall Risk  04/27/2015 01/19/2015 10/20/2014 10/20/2014  Falls in the past year? No No No No     Functional  Status Survey: Is the patient deaf or have difficulty hearing?: No Does the patient have difficulty seeing, even when wearing glasses/contacts?: No Does the patient have difficulty concentrating, remembering, or making decisions?: No Does the patient have difficulty walking or climbing stairs?: No Does the patient have difficulty dressing or bathing?: No Does the patient have difficulty doing errands alone such as visiting a doctor's office or shopping?: No    Assessment & Plan  1. Major depression, recurrent, chronic (HCC)  Continue medication and therapy   2. GAD (generalized anxiety disorder)  - ALPRAZolam (XANAX XR) 1 MG 24 hr tablet; Take 1 tablet (1 mg total) by mouth daily.  Dispense: 30 tablet; Refill: 2 - ALPRAZolam (XANAX) 0.5 MG tablet; Take 1 tablet (0.5 mg total) by mouth at bedtime as needed. for sleep  Dispense: 30 tablet; Refill: 0  3. Acquired hypothyroidism  - SYNTHROID 200 MCG tablet; Take 1 tablet (200 mcg total) by mouth daily before breakfast.  Dispense: 90 tablet; Refill: 0 - TSH  4. Gastro-esophageal reflux disease without esophagitis  Well controlled  5. Female infertility  She will follow up with GYN  6. Morbid obesity, unspecified obesity type [facility])  Discussed with the patient the risk posed by an increased BMI. Discussed importance of portion control, calorie counting and at least 150 minutes of physical activity weekly. Avoid sweet beverages and drink more water. Eat at least 6 servings of fruit and vegetables daily   7. Hypocalcemia  - Calcium, ionized - Calcium  8. Primary dysmenorrhea  - VICODIN 5-300 MG TABS; Take 1 tablet by mouth 2 (two) times daily.  Dispense: 30 each; Refill: 0   9. Chronic tension-type headache, not intractable  - amitriptyline (ELAVIL) 25 MG tablet; Take 1 tablet (25 mg total) by mouth at bedtime.  Dispense: 90 tablet; Refill: 0

## 2015-07-28 ENCOUNTER — Ambulatory Visit (INDEPENDENT_AMBULATORY_CARE_PROVIDER_SITE_OTHER): Payer: 59 | Admitting: Family Medicine

## 2015-07-28 ENCOUNTER — Encounter: Payer: Self-pay | Admitting: Family Medicine

## 2015-07-28 ENCOUNTER — Encounter: Payer: Self-pay | Admitting: *Deleted

## 2015-07-28 VITALS — BP 126/74 | HR 104 | Temp 97.9°F | Resp 16 | Ht 66.0 in | Wt 263.9 lb

## 2015-07-28 DIAGNOSIS — G2581 Restless legs syndrome: Secondary | ICD-10-CM

## 2015-07-28 DIAGNOSIS — F411 Generalized anxiety disorder: Secondary | ICD-10-CM | POA: Diagnosis not present

## 2015-07-28 DIAGNOSIS — L729 Follicular cyst of the skin and subcutaneous tissue, unspecified: Secondary | ICD-10-CM

## 2015-07-28 DIAGNOSIS — N944 Primary dysmenorrhea: Secondary | ICD-10-CM

## 2015-07-28 DIAGNOSIS — G44229 Chronic tension-type headache, not intractable: Secondary | ICD-10-CM

## 2015-07-28 DIAGNOSIS — F339 Major depressive disorder, recurrent, unspecified: Secondary | ICD-10-CM | POA: Diagnosis not present

## 2015-07-28 DIAGNOSIS — E039 Hypothyroidism, unspecified: Secondary | ICD-10-CM

## 2015-07-28 DIAGNOSIS — K219 Gastro-esophageal reflux disease without esophagitis: Secondary | ICD-10-CM

## 2015-07-28 LAB — TSH: TSH: 1.96 u[IU]/mL (ref 0.450–4.500)

## 2015-07-28 LAB — CALCIUM, IONIZED: CALCIUM ION: 4.4 mg/dL — AB (ref 4.5–5.6)

## 2015-07-28 LAB — CALCIUM: CALCIUM: 8.3 mg/dL — AB (ref 8.7–10.2)

## 2015-07-28 MED ORDER — VICODIN 5-300 MG PO TABS: 1.0000 | ORAL_TABLET | Freq: Two times a day (BID) | ORAL | Status: DC

## 2015-07-28 MED ORDER — ALPRAZOLAM ER 1 MG PO TB24: 1.0000 mg | ORAL_TABLET | Freq: Every day | ORAL | Status: DC

## 2015-07-28 MED ORDER — DESONIDE 0.05 % EX CREA: TOPICAL_CREAM | CUTANEOUS | Status: DC

## 2015-07-28 MED ORDER — OMEPRAZOLE 20 MG PO CPDR: 20.0000 mg | DELAYED_RELEASE_CAPSULE | Freq: Every day | ORAL | Status: DC

## 2015-07-28 MED ORDER — LEVOTHYROXINE SODIUM 112 MCG PO TABS: 224.0000 ug | ORAL_TABLET | Freq: Every day | ORAL | Status: DC

## 2015-07-28 MED ORDER — ALPRAZOLAM 0.5 MG PO TABS: 0.5000 mg | ORAL_TABLET | Freq: Every evening | ORAL | Status: DC | PRN

## 2015-07-28 NOTE — Progress Notes (Signed)
Name: Jo [name]   MRN: [id number]    DOB: 03-Sep-1980   Date:07/28/2015       Progress Note  Subjective  Chief Complaint  Chief Complaint  Patient presents with  . Medication Refill  . Depression    Improving with therapy and medication  . Anxiety    Not having as many anxiety attacks as she used too  . Hypothyroidism    Patient is very tired and having dry skin. States she had blood work checked yesterday and prefers Dr. Ancil Boozer to monitor her thyroid rather than Dr. Gabriel Carina  . Gastroesophageal Reflux    Stable  . Obesity  . Migraine    Less Frequent-having them once every few weeks  . Recurrent Skin Infections    Patient has had a boil under her right breast for the past couple of months that is not healing, patient has tried neosporin with no relief and states white pus has been coming out of the area. Patient states it is painful and bruised where her bra rubs against it.    . Menstrual Problem    Patient is having irregular cycles last cycle was from 05/14 through 05/24 and then since June 4 she has been spotting constantly.     HPI  Major Depression recurrent: she is getting better, therapy has helped, still going once a week. Symptoms are stable. She is sad because of her fertility problems, would like to have a baby, but states she has been coping better - got a new puppy and has been busy. She has tried multiple medications and is afraid to change medications. She avoids going to baby showers.  Headaches: she has a dull aching that radiates from neck to frontal area, history of migraine headache but no recent episodes - only taking elavil prn . No nausea, vomiting or photophobia. Almost daily but worse before her cycles. Taking otc Excedring and sometimes Hydrocodone.   GAD: she takes Alprazolam XR daily and prn xanax immediate release. Still worries all the time, has problems sleeping, she has a short temper at times. Stable with Cymbalta and Xanax XR  GERD: under control  with medication, no heartburn or regurgitation.   Hypothyroidism: she sees Dr. Gabriel Carina but states she would like to be monitored at our office, because of cost. She is currently on Synthroid 254mcg, s/p thyroidectomy , TSH is at goal now.   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 We will repeat labs   Female Fertility: she will follow up with [facility]. She is now ovulating. She tried Clomide in the past without success but she was not ovulating at the time. She was having regular cycles, but started cycle this month that was late to start and has been spotting since. Home pregnancy test negative. Previous history of anovulation and irregular cycles.   Primary dysmenorrhea: still takes hydrocodone prn for pain.  Cyst: present on right anterior bra line for the past couple of months, she tries to squeeze but is still present, making her worry about cancer.   RLS: not recent episodes, using essential oils and prn Requip   Patient Active Problem List   Diagnosis Date Noted  . Tension type headache 04/27/2015  . GAD (generalized anxiety disorder) 10/18/2014  . RLS (restless legs syndrome) 10/18/2014  . Insomnia, persistent 10/18/2014  . Migraine without aura and responsive to treatment 10/18/2014  . CN (constipation) 10/18/2014  .  Major depression, recurrent, chronic ([place]) 10/18/2014  . Dysmenorrhea 10/18/2014  . Female infertility 10/18/2014  . Gastro-esophageal reflux disease without esophagitis 10/18/2014  . Allergy, food 10/18/2014  . H/O Hodgkin's lymphoma 10/18/2014  . H/O thyroidectomy 10/18/2014  . Blood glucose elevated 10/18/2014  . Extreme obesity ([place]) 10/18/2014  . Low parathyroid level after removal ([place]) 10/18/2014  . Vitamin D deficiency 10/18/2014  . Hive 10/18/2014  . Dermatitis seborrheica 10/18/2014  . Arthralgia of temporomandibular joint 10/18/2014  . Hodgkin's  lymphoma ([place]) 11/19/2012  . Acquired hypothyroidism 10/13/2012    Past Surgical History  Procedure Laterality Date  . Thyroidectomy  10-13-12  . Port-a-cath removal  2004  . Thyroid surgery    . Portacath placement  11-24-12  . Lymphonodi biopsy    . Uterine poly removal Left 08/2010    [facility]    Family History  Problem Relation Age of Onset  . Breast cancer Paternal Aunt     great aunt >50  . Breast cancer Cousin   . Hypertension Mother   . Diabetes Father   . Heart disease Father   . CAD Father   . Diabetes Brother   . CAD Maternal Grandmother   . Parkinson's disease Maternal Grandmother   . Parkinson's disease Maternal Grandfather   . CAD Paternal Grandmother     Social History   Social History  . Marital Status: Married    Spouse Name: N/A  . Number of Children: N/A  . Years of Education: N/A   Occupational History  . Not on file.   Social History Main Topics  . Smoking status: Former Smoker -- 1.00 packs/day for 0 years    Types: Cigarettes    Quit date: 07/27/2009  . Smokeless tobacco: Never Used  . Alcohol Use: 0.0 oz/week    0 Standard drinks or equivalent per week     Comment: rarely  . Drug Use: No  . Sexual Activity: Yes   Other Topics Concern  . Not on file   Social History Narrative     Current outpatient prescriptions:  .  ALPRAZolam (XANAX XR) 1 MG 24 hr tablet, Take 1 tablet (1 mg total) by mouth daily., Disp: 30 tablet, Rfl: 2 .  ALPRAZolam (XANAX) 0.5 MG tablet, Take 1 tablet (0.5 mg total) by mouth at bedtime as needed. for sleep, Disp: 30 tablet, Rfl: 0 .  amitriptyline (ELAVIL) 25 MG tablet, Take 1 tablet (25 mg total) by mouth at bedtime., Disp: 90 tablet, Rfl: 0 .  Biotin 1 MG CAPS, Take by mouth., Disp: , Rfl:  .  calcitRIOL (ROCALTROL) 0.5 MCG capsule, , Disp: , Rfl:  .  calcium acetate (PHOSLO) 667 MG tablet, Take by mouth., Disp: , Rfl:  .  desonide (DESOWEN) 0.05 % cream, Apply two times daily, Disp: 180 g, Rfl: 0 .   DULoxetine (CYMBALTA) 30 MG capsule, Take 3 capsules (90 mg total) by mouth daily., Disp: 270 capsule, Rfl: 1 .  EPINEPHrine 0.3 mg/0.3 mL IJ SOAJ injection, , Disp: , Rfl:  .  fexofenadine (ALLEGRA) 180 MG tablet, Take by mouth., Disp: , Rfl:  .  hydrOXYzine (ATARAX/VISTARIL) 25 MG tablet, Take 25 mg by mouth every 6 (six) hours as needed. , Disp: , Rfl:  .  Mefenamic Acid 250 MG CAPS, , Disp: , Rfl:  .  omeprazole (PRILOSEC) 20 MG capsule, Take 1 capsule (20 mg total) by mouth daily., Disp: 90 capsule, Rfl: 1 .  rOPINIRole (REQUIP) 0.5 MG tablet, Take 1  tablet (0.5 mg total) by mouth at bedtime., Disp: 90 tablet, Rfl: 1 .  VICODIN 5-300 MG TABS, Take 1 tablet by mouth 2 (two) times daily., Disp: 30 each, Rfl: 0 .  levothyroxine (SYNTHROID, LEVOTHROID) 112 MCG tablet, Take 2 tablets (224 mcg total) by mouth daily., Disp: 90 tablet, Rfl: 1  Allergies  Allergen Reactions  . Tape Rash    Uncoded Allergy. Allergen: Msg  . Iodinated Diagnostic Agents Hives    Uncoded Allergy. Allergen: CATS  . Barium-Containing Compounds Hives  . Monosodium Glutamate     Unknown.  . Other Rash    Contrast dye     ROS  Constitutional: Negative for fever or weight change.  Respiratory: Negative for cough and shortness of breath.   Cardiovascular: Negative for chest pain or palpitations.  Gastrointestinal: Negative for abdominal pain, no bowel changes.  Musculoskeletal: Negative for gait problem or joint swelling.  Skin: Negative for rash. Cyst skin Neurological: Negative for dizziness, positive for intermittent  headache.  No other specific complaints in a complete review of systems (except as listed in HPI above).  Objective  Filed Vitals:   07/28/15 1023  BP: 126/74  Pulse: 104  Temp: 97.9 F (36.6 C)  TempSrc: Oral  Resp: 16  Height: 5\' 6"  (1.676 m)  Weight: 263 lb 14.4 oz (119.704 kg)  SpO2: 95%    Body mass index is 42.61 kg/(m^2).  Physical Exam  Constitutional: Patient appears  well-developed and well-nourished. Obese No distress.  HEENT: head atraumatic, normocephalic, pupils equal and reactive to light,  neck supple, throat within normal limits, well healed thyroidectomy scar Cardiovascular: Normal rate, regular rhythm and normal heart sounds.  No murmur heard. No BLE edema. Pulmonary/Chest: Effort normal and breath sounds normal. No respiratory distress. Abdominal: Soft.  There is no tenderness. Psychiatric: Patient has a normal mood and affect. behavior is normal. Judgment and thought content normal. Skin: cyst on right anterior chest wall, non-tender  Recent Results (from the past 2160 hour(s))  TSH     Status: None   Collection Time: 07/27/15  2:23 PM  Result Value Ref Range   TSH 1.960 0.450 - 4.500 uIU/mL  Calcium, ionized     Status: None (Preliminary result)   Collection Time: 07/27/15  2:23 PM  Result Value Ref Range   Calcium, Ion WILL FOLLOW   Calcium     Status: Abnormal   Collection Time: 07/27/15  2:23 PM  Result Value Ref Range   Calcium 8.3 (L) 8.7 - 10.2 mg/dL      PHQ2/9: Depression screen [facility] 2/9 07/28/2015 01/19/2015 10/20/2014  Decreased Interest 0 0 0  Down, Depressed, Hopeless 0 1 2  PHQ - 2 Score 0 1 2  Altered sleeping - - 0  Tired, decreased energy - - 1  Change in appetite - - 0  Feeling bad or failure about yourself  - - 1  Trouble concentrating - - 1  Moving slowly or fidgety/restless - - 0  Suicidal thoughts - - 0  PHQ-9 Score - - 5  Difficult doing work/chores - - Not difficult at all     Fall Risk: Fall Risk  07/28/2015 04/27/2015 01/19/2015 10/20/2014 10/20/2014  Falls in the past year? No No No No No      Functional Status Survey: Is the patient deaf or have difficulty hearing?: No Does the patient have difficulty seeing, even when wearing glasses/contacts?: No Does the patient have difficulty concentrating, remembering, or making decisions?: No Does the  patient have difficulty walking or climbing stairs?:  No Does the patient have difficulty dressing or bathing?: No Does the patient have difficulty doing errands alone such as visiting a doctor's office or shopping?: No    Assessment & Plan  1. Major depression, recurrent, chronic (HCC)  Continue medication and therapy   2. GAD (generalized anxiety disorder)  - ALPRAZolam (XANAX) 0.5 MG tablet; Take 1 tablet (0.5 mg total) by mouth at bedtime as needed. for sleep  Dispense: 30 tablet; Refill: 0 - ALPRAZolam (XANAX XR) 1 MG 24 hr tablet; Take 1 tablet (1 mg total) by mouth daily.  Dispense: 30 tablet; Refill: 2   3. Acquired hypothyroidism  - levothyroxine (SYNTHROID, LEVOTHROID) 112 MCG tablet; Take 2 tablets (224 mcg total) by mouth daily.  Dispense: 90 tablet; Refill: 1  4. Morbid obesity, unspecified obesity type [facility])  Discussed with the patient the risk posed by an increased BMI. Discussed importance of portion control, calorie counting and at least 150 minutes of physical activity weekly. Avoid sweet beverages and drink more water. Eat at least 6 servings of fruit and vegetables daily   5. Hypocalcemia  Stable, ionized calcium is pending  6. Primary dysmenorrhea  - VICODIN 5-300 MG TABS; Take 1 tablet by mouth 2 (two) times daily.  Dispense: 30 each; Refill: 0  7. RLS (restless legs syndrome)  Continue prn medication   8. Gastro-esophageal reflux disease without esophagitis  - omeprazole (PRILOSEC) 20 MG capsule; Take 1 capsule (20 mg total) by mouth daily.  Dispense: 90 capsule; Refill: 1  9. Chronic tension-type headache, not intractable  Continue prn medication  10. Cyst of skin  - Ambulatory referral to General Surgery

## 2015-07-31 ENCOUNTER — Encounter: Payer: Self-pay | Admitting: General Surgery

## 2015-07-31 ENCOUNTER — Ambulatory Visit (INDEPENDENT_AMBULATORY_CARE_PROVIDER_SITE_OTHER): Payer: 59 | Admitting: General Surgery

## 2015-07-31 VITALS — BP 122/70 | HR 89 | Resp 14 | Ht 66.0 in | Wt 267.0 lb

## 2015-07-31 DIAGNOSIS — L729 Follicular cyst of the skin and subcutaneous tissue, unspecified: Secondary | ICD-10-CM

## 2015-07-31 DIAGNOSIS — L089 Local infection of the skin and subcutaneous tissue, unspecified: Secondary | ICD-10-CM

## 2015-07-31 MED ORDER — FLUCONAZOLE 100 MG PO TABS: 100.0000 mg | ORAL_TABLET | Freq: Once | ORAL | Status: AC

## 2015-07-31 MED ORDER — DOXYCYCLINE HYCLATE 100 MG PO CAPS: 100.0000 mg | ORAL_CAPSULE | Freq: Two times a day (BID) | ORAL | Status: DC

## 2015-07-31 NOTE — Progress Notes (Signed)
Patient ID: Jo [name], female   DOB: 06-24-1980, 35 y.o.   MRN: [id number]  Chief Complaint  Patient presents with  . Other    right breast cyst    HPI Jo [name] is a 35 y.o. female here today for a evaluation of a possible cyst under the right brest. She first noticed this about 1-2 months ago. She has has some minor drainage from the area. She states today she did have a lot of oozing from the area. Patient does perform self breast checks.  I have reviewed the history of present illness with the patient.  HPI  Past Medical History  Diagnosis Date  . Hodgkin disease ([place]) 2001 and 2014    chemo and rad tx in 2001 and 2014  . Lymphoma, Hodgkin's ([place])     2001, 2014  . Thyroid disease   . Hodgkin's disease in remission   . Anxiety   . GERD (gastroesophageal reflux disease)   . Depression   . Hives   . Cramp of limb   . Oligomenorrhea   . Seborrhea   . Obesity   . TMJ (dislocation of temporomandibular joint)   . Hyperlipidemia   . Vitamin D deficiency   . Migraine   . Screening mammogram for high-risk patient   . Primary female infertility   . Insomnia   . Snoring   . Constipation     Past Surgical History  Procedure Laterality Date  . Thyroidectomy  10-13-12  . Port-a-cath removal  2004  . Thyroid surgery    . Portacath placement  11-24-12  . Lymphonodi biopsy    . Uterine poly removal Left 08/2010    [facility]    Family History  Problem Relation Age of Onset  . Breast cancer Paternal Aunt     great aunt >50  . Breast cancer Cousin   . Hypertension Mother   . Diabetes Father   . Heart disease Father   . CAD Father   . Diabetes Brother   . CAD Maternal Grandmother   . Parkinson's disease Maternal Grandmother   . Parkinson's disease Maternal Grandfather   . CAD Paternal Grandmother     Social History Social History  Substance Use Topics  . Smoking status: Former Smoker -- 1.00 packs/day for 0 years    Types: Cigarettes    Quit date: 07/27/2009   . Smokeless tobacco: Never Used  . Alcohol Use: 0.0 oz/week    0 Standard drinks or equivalent per week     Comment: rarely    Allergies  Allergen Reactions  . Tape Rash    Uncoded Allergy. Allergen: Msg  . Iodinated Diagnostic Agents Hives    Uncoded Allergy. Allergen: CATS  . Barium-Containing Compounds Hives  . Monosodium Glutamate     Unknown.  . Other Rash    Contrast dye    Current Outpatient Prescriptions  Medication Sig Dispense Refill  . ALPRAZolam (XANAX XR) 1 MG 24 hr tablet Take 1 tablet (1 mg total) by mouth daily. 30 tablet 2  . ALPRAZolam (XANAX) 0.5 MG tablet Take 1 tablet (0.5 mg total) by mouth at bedtime as needed. for sleep 30 tablet 0  . amitriptyline (ELAVIL) 25 MG tablet Take 1 tablet (25 mg total) by mouth at bedtime. 90 tablet 0  . Biotin 1 MG CAPS Take by mouth.    . calcitRIOL (ROCALTROL) 0.5 MCG capsule     . calcium acetate (PHOSLO) 667 MG tablet Take by mouth.    Marland Kitchen  desonide (DESOWEN) 0.05 % cream Apply two times daily 180 g 0  . DULoxetine (CYMBALTA) 30 MG capsule Take 3 capsules (90 mg total) by mouth daily. 270 capsule 1  . EPINEPHrine 0.3 mg/0.3 mL IJ SOAJ injection     . fexofenadine (ALLEGRA) 180 MG tablet Take by mouth.    . hydrOXYzine (ATARAX/VISTARIL) 25 MG tablet Take 25 mg by mouth every 6 (six) hours as needed.     Marland Kitchen levothyroxine (SYNTHROID, LEVOTHROID) 112 MCG tablet Take 2 tablets (224 mcg total) by mouth daily. 90 tablet 1  . Mefenamic Acid 250 MG CAPS     . omeprazole (PRILOSEC) 20 MG capsule Take 1 capsule (20 mg total) by mouth daily. 90 capsule 1  . VICODIN 5-300 MG TABS Take 1 tablet by mouth 2 (two) times daily. 30 each 0  . doxycycline (VIBRAMYCIN) 100 MG capsule Take 1 capsule (100 mg total) by mouth 2 (two) times daily. 14 capsule 0  . fluconazole (DIFLUCAN) 100 MG tablet Take 1 tablet (100 mg total) by mouth once. 1 tablet 0   No current facility-administered medications for this visit.    Review of Systems Review of  Systems  Constitutional: Negative.   Respiratory: Negative.   Cardiovascular: Negative.     Blood pressure 122/70, pulse 89, resp. rate 14, height 5\' 6"  (1.676 m), weight 267 lb (121.11 kg), last menstrual period 07/16/2015.  Physical Exam Physical Exam  Constitutional: She is oriented to person, place, and time. She appears well-nourished.  Eyes: Conjunctivae are normal. No scleral icterus.  Abdominal: There is no tenderness.  Neurological: She is alert and oriented to person, place, and time.  Skin: Skin is warm and dry.     2 cm cyst with small central opening on anterior axillary line just inferolateral to right breast     Data Reviewed None Assessment    2 cm infected cyst with small central opening on anterior axillary line    Plan      Patient prescibed doxycyline for 1 week then plan to come in for excision of cyst 1 dose of diflucan  prescribed  PCP:  Steele Sizer This has been scribed by Lesly Rubenstein LPN    Christene Lye 07/31/2015, 4:14 PM

## 2015-07-31 NOTE — Patient Instructions (Signed)
Finish your antibiotics. Follow up in 4 weeks.

## 2015-08-23 ENCOUNTER — Ambulatory Visit (INDEPENDENT_AMBULATORY_CARE_PROVIDER_SITE_OTHER): Payer: 59 | Admitting: General Surgery

## 2015-08-23 ENCOUNTER — Encounter: Payer: Self-pay | Admitting: General Surgery

## 2015-08-23 VITALS — BP 108/64 | HR 88 | Resp 16 | Ht 66.0 in | Wt 259.0 lb

## 2015-08-23 DIAGNOSIS — L729 Follicular cyst of the skin and subcutaneous tissue, unspecified: Secondary | ICD-10-CM | POA: Diagnosis not present

## 2015-08-23 DIAGNOSIS — L089 Local infection of the skin and subcutaneous tissue, unspecified: Secondary | ICD-10-CM

## 2015-08-23 NOTE — Progress Notes (Signed)
This is a 35 year old female here today for excision right chest wall cyst. She completed her course of antibiotic with good resolution of infection. I have reviewed the history of present illness with the patient.  Exam showed a non inflamed 1cm cyst right lateral chest wall. Excision performes as scheduled.  Procedure: Excision skin cyst right lateral chest wall.  Anesthetic: 10 ml 1% xylocaine mixed with 0.5% marcaine  Prep: chloro prep  Description: Pt positioned on her side and right chet wall area anesthetized and prepped. Area covered with sterile towels. Elliptical 2cm incision made around the cyst. This was then removed completely from underlying subcutaneous tissue. Bleeding points cauterized. Wound closed with vertical mattress and simple sutures of 4-0 Nylon.  Dressed with neosporin oint, telfa and tegaderm. Procedure well tolerated.  Ice pack applied, advise she use this off and on for rest of today.  Suture removal in 10 days.  Advised on wound care  PCP:  [facility] This information has been scribed by Karie Fetch RN, BSN,BC.

## 2015-08-23 NOTE — Patient Instructions (Addendum)
Keep area clean May remove dressing in 2-3 days and apply neosporin and band aid to cover Return for suture removal

## 2015-08-25 LAB — PATHOLOGY

## 2015-08-28 ENCOUNTER — Telehealth: Payer: Self-pay | Admitting: *Deleted

## 2015-08-28 NOTE — Telephone Encounter (Signed)
Left message for patient to call the office back, please let patient know pathology is normal

## 2015-08-30 ENCOUNTER — Telehealth: Payer: Self-pay | Admitting: *Deleted

## 2015-08-30 ENCOUNTER — Other Ambulatory Visit: Payer: Self-pay | Admitting: *Deleted

## 2015-08-30 ENCOUNTER — Inpatient Hospital Stay: Payer: 59 | Attending: Oncology

## 2015-08-30 DIAGNOSIS — Z01812 Encounter for preprocedural laboratory examination: Secondary | ICD-10-CM

## 2015-08-30 DIAGNOSIS — Z8572 Personal history of non-Hodgkin lymphomas: Secondary | ICD-10-CM | POA: Diagnosis not present

## 2015-08-30 LAB — PREGNANCY, URINE: Preg Test, Ur: NEGATIVE

## 2015-08-30 NOTE — Telephone Encounter (Signed)
Call received this morning from PET/CT regarding need for pregnancy test prior to PET tomorrow. Patient states her periods have gotten more regular and she had a period this month. Patient agrees to come in for urine pregnancy test this afternoon. Schedule message sent for patient to be added on for lab visit this afternoon.

## 2015-08-31 ENCOUNTER — Ambulatory Visit
Admission: RE | Admit: 2015-08-31 | Discharge: 2015-08-31 | Disposition: A | Discharge status: Home / Self Care | Payer: 59 | Source: Ambulatory Visit | Attending: Oncology | Admitting: Oncology

## 2015-08-31 DIAGNOSIS — C8190 Hodgkin lymphoma, unspecified, unspecified site: Secondary | ICD-10-CM | POA: Insufficient documentation

## 2015-08-31 LAB — GLUCOSE, CAPILLARY: Glucose-Capillary: 102 mg/dL — ABNORMAL HIGH (ref 65–99)

## 2015-08-31 MED ORDER — FLUDEOXYGLUCOSE F - 18 (FDG) INJECTION
13.3100 | Freq: Once | INTRAVENOUS | Status: AC | PRN
  Administered 2015-08-31: 13.31 via INTRAVENOUS

## 2015-09-01 ENCOUNTER — Ambulatory Visit (INDEPENDENT_AMBULATORY_CARE_PROVIDER_SITE_OTHER): Payer: 59 | Admitting: *Deleted

## 2015-09-01 DIAGNOSIS — L729 Follicular cyst of the skin and subcutaneous tissue, unspecified: Secondary | ICD-10-CM

## 2015-09-01 DIAGNOSIS — L089 Local infection of the skin and subcutaneous tissue, unspecified: Secondary | ICD-10-CM

## 2015-09-01 NOTE — Progress Notes (Signed)
Patient came in today for a wound check.  The wound is clean, with no signs of infection noted. The sutures were removed and steri strips applied.  

## 2015-09-07 ENCOUNTER — Ambulatory Visit: Payer: 59 | Admitting: Oncology

## 2015-09-07 ENCOUNTER — Other Ambulatory Visit: Payer: 59

## 2015-09-11 NOTE — Progress Notes (Signed)
[place]  Telephone:([phone]) [phone] Fax:(336) [phone]  ID: Jo [name] OB: 02-May-1980  MR#: [id number]  [id number]  Patient Care Team: Steele Sizer, MD as PCP - General (Family Medicine) Seeplaputhur Robinette Haines, MD (General Surgery) Forest Gleason, MD (Unknown Physician Specialty) Steele Sizer, MD as Attending Physician (Family Medicine)  CHIEF COMPLAINT: Hodgkin's lymphoma  INTERVAL HISTORY: Patient returns to clinic today for repeat laboratory and discussion of her imaging results. She continues to feel well and remains asymptomatic. She denies any fevers, weight loss, or night sweats. She has no neurologic complaints. She denies any weakness or fatigue. She has no chest pain or shortness of breath. She denies any nausea, vomiting, constipation, or diarrhea. She has no urinary complaints. Patient feels at her baseline and offers no specific complaints today.  REVIEW OF SYSTEMS:   Review of Systems  Constitutional: Negative.  Negative for diaphoresis, fever, malaise/fatigue and weight loss.  HENT: Negative.   Respiratory: Negative.  Negative for cough and shortness of breath.   Cardiovascular: Negative.  Negative for chest pain.  Gastrointestinal: Negative.  Negative for abdominal pain.  Genitourinary: Negative.   Musculoskeletal: Negative.   Neurological: Negative.  Negative for weakness.  Psychiatric/Behavioral: Negative.  The patient is not nervous/anxious.     As per HPI. Otherwise, a complete review of systems is negatve.  PAST MEDICAL HISTORY: Past Medical History:  Diagnosis Date  . Anxiety   . Constipation   . Cramp of limb   . Depression   . GERD (gastroesophageal reflux disease)   . Hives   . Hodgkin disease ([place]) 2001 and 2014   chemo and rad tx in 2001 and 2014  . Hodgkin's disease in remission   . Hyperlipidemia   . Insomnia   . Lymphoma, Hodgkin's ([place])    2001, 2014  . Migraine   . Obesity   . Oligomenorrhea   . Primary  female infertility   . Screening mammogram for high-risk patient   . Seborrhea   . Snoring   . Thyroid disease   . TMJ (dislocation of temporomandibular joint)   . Vitamin D deficiency     PAST SURGICAL HISTORY: Past Surgical History:  Procedure Laterality Date  . lymphonodi biopsy    . PORT-A-CATH REMOVAL  2004  . PORTACATH PLACEMENT  11-24-12  . THYROID SURGERY    . THYROIDECTOMY  10-13-12  . uterine poly removal Left 08/2010   [facility]    FAMILY HISTORY: Family History  Problem Relation Age of Onset  . Breast cancer Paternal Aunt     great aunt >50  . Breast cancer Cousin   . Hypertension Mother   . Diabetes Father   . Heart disease Father   . CAD Father   . Diabetes Brother   . CAD Maternal Grandmother   . Parkinson's disease Maternal Grandmother   . Parkinson's disease Maternal Grandfather   . CAD Paternal Grandmother        ADVANCED DIRECTIVES:    HEALTH MAINTENANCE: Social History  Substance Use Topics  . Smoking status: Former Smoker    Packs/day: 1.00    Years: 0.00    Types: Cigarettes    Quit date: 07/27/2009  . Smokeless tobacco: Never Used  . Alcohol use 0.0 oz/week     Comment: rarely     Colonoscopy:  PAP:  Bone density:  Lipid panel:  Allergies  Allergen Reactions  . Tape Rash    Uncoded Allergy. Allergen: Msg  . Iodinated Diagnostic Agents Hives  Uncoded Allergy. Allergen: CATS  . Barium-Containing Compounds Hives  . Monosodium Glutamate     Unknown.  . Other Rash    Contrast dye    Current Outpatient Prescriptions  Medication Sig Dispense Refill  . ALPRAZolam (XANAX XR) 1 MG 24 hr tablet Take 1 tablet (1 mg total) by mouth daily. 30 tablet 2  . ALPRAZolam (XANAX) 0.5 MG tablet Take 1 tablet (0.5 mg total) by mouth at bedtime as needed. for sleep 30 tablet 0  . amitriptyline (ELAVIL) 25 MG tablet Take 1 tablet (25 mg total) by mouth at bedtime. 90 tablet 0  . Biotin 1 MG CAPS Take by mouth.    . calcitRIOL (ROCALTROL) 0.5 MCG  capsule     . calcium acetate (PHOSLO) 667 MG tablet Take by mouth.    . desonide (DESOWEN) 0.05 % cream Apply two times daily 180 g 0  . DULoxetine (CYMBALTA) 30 MG capsule Take 3 capsules (90 mg total) by mouth daily. 270 capsule 1  . EPINEPHrine 0.3 mg/0.3 mL IJ SOAJ injection     . fexofenadine (ALLEGRA) 180 MG tablet Take by mouth.    . hydrOXYzine (ATARAX/VISTARIL) 25 MG tablet Take 25 mg by mouth every 6 (six) hours as needed.     Marland Kitchen levothyroxine (SYNTHROID, LEVOTHROID) 112 MCG tablet Take 2 tablets (224 mcg total) by mouth daily. 90 tablet 1  . Mefenamic Acid 250 MG CAPS     . omeprazole (PRILOSEC) 20 MG capsule Take 1 capsule (20 mg total) by mouth daily. 90 capsule 1  . VICODIN 5-300 MG TABS Take 1 tablet by mouth 2 (two) times daily. 30 each 0   No current facility-administered medications for this visit.     OBJECTIVE: There were no vitals filed for this visit.   There is no height or weight on file to calculate BMI.    ECOG FS:0 - Asymptomatic  General: Well-developed, well-nourished, no acute distress. Eyes: Pink conjunctiva, anicteric sclera. HEENT: Normocephalic, moist mucous membranes, clear oropharnyx. Lungs: Clear to auscultation bilaterally. Heart: Regular rate and rhythm. No rubs, murmurs, or gallops. Abdomen: Soft, nontender, nondistended. No organomegaly noted, normoactive bowel sounds. Musculoskeletal: No edema, cyanosis, or clubbing. Neuro: Alert, answering all questions appropriately. Cranial nerves grossly intact. Skin: No rashes or petechiae noted. Psych: Normal affect. Lymphatics: No cervical, calvicular, axillary or inguinal LAD.   LAB RESULTS:  Lab Results  Component Value Date   NA 133 (L) 03/08/2015   K 3.7 03/08/2015   CL 99 (L) 03/08/2015   CO2 26 03/08/2015   GLUCOSE 94 03/08/2015   BUN 20 03/08/2015   CREATININE 0.63 03/08/2015   CALCIUM 8.3 (L) 07/27/2015   PROT 7.6 03/08/2015   ALBUMIN 4.1 03/08/2015   AST 16 03/08/2015   ALT 19  03/08/2015   ALKPHOS 59 03/08/2015   BILITOT 0.2 (L) 03/08/2015   GFRNONAA >60 03/08/2015   GFRAA >60 03/08/2015    Lab Results  Component Value Date   WBC 12.3 (H) 03/08/2015   NEUTROABS 8.6 (H) 03/08/2015   HGB 12.1 03/08/2015   HCT 36.6 03/08/2015   MCV 73.5 (L) 03/08/2015   PLT 337 03/08/2015     STUDIES: Nm Pet Image Restag (ps) Skull Base To Thigh  Result Date: 09/01/2015 CLINICAL DATA:  Subsequent treatment strategy for lymphoma restaging. Prior study showed hypermetabolism along the lateral left fourth rib with an adjacent fourth rib sclerotic lesion. EXAM: NUCLEAR MEDICINE PET SKULL BASE TO THIGH TECHNIQUE: 13.3 mCi F-18 FDG was injected  intravenously. Full-ring PET imaging was performed from the skull base to thigh after the radiotracer. CT data was obtained and used for attenuation correction and anatomic localization. FASTING BLOOD GLUCOSE:  Value: 107 mg/dl COMPARISON:  08/31/2014. FINDINGS: NECK No hypermetabolic lymph nodes in the neck. CHEST No hypermetabolic mediastinal or hilar nodes. No suspicious pulmonary nodules on the CT scan. As noted previously, there is densely calcified nodal tissue in the prevascular space without associated hypermetabolism. Imaging features remain compatible with treated lymphoma. The tip of the left-sided Port-A-Cath is positioned at the junction of the SVC and RA. ABDOMEN/PELVIS No abnormal hypermetabolic activity within the liver, pancreas, adrenal glands, or spleen. No hypermetabolic lymph nodes in the abdomen or pelvis. Previously described low-density thickening of the right adrenal gland is unchanged. No associated hypermetabolism. SKELETON No focal hypermetabolic activity to suggest skeletal metastasis. The mild hypermetabolism seen previously along the lateral aspect of the left fourth rib is not evident on today's study. The tiny sclerotic focus in the left fourth rib is unchanged in the interval, remaining compatible with a bone island.  IMPRESSION: 1. No evidence for hypermetabolic lymphadenopathy in the neck, chest, abdomen, or pelvis. 2. Stable appearance of densely calcified nodal tissue in the anterior mediastinum, compatible with treated lymphoma. 3. No new or progressive findings. Electronically Signed   By: Misty Stanley M.D.   On: 09/01/2015 17:08    ASSESSMENT: Recurrent stage IV Hodgkin's lymphoma, in remission.  PLAN:    1. Hodgkin's lymphoma, by report patient had stage IV disease. She was initially diagnosed in 2001 where she received MOPP chemotherapy. Patient remained in remission until she had a local recurrence in 2014 in her neck confirmed by lymph node excision. She received 2 cycles of ABVD and then XRT for local control. Her most recent PET scan on September 01, 2015 reviewed independently and reported as above with no evidence of recurrence. Patient is now 3 years out from her recurrence, and can be switched to yearly imaging with CT scans. Return to clinic in 6 months with repeat laboratory work and further evaluation. 2. Port: Patient is greater than 3 years removed from her treatments and has a negative PET scan, therefore it is reasonable to have her port removed.  Approximately 30 minutes was spent in discussion of which greater than 50% was consultation.  Patient expressed understanding and was in agreement with this plan. She also understands that She can call clinic at any time with any questions, concerns, or complaints.    Lloyd Huger, MD   09/11/2015 9:03 PM

## 2015-09-12 ENCOUNTER — Inpatient Hospital Stay: Payer: 59

## 2015-09-12 ENCOUNTER — Inpatient Hospital Stay: Payer: 59 | Attending: Oncology | Admitting: Oncology

## 2015-09-12 ENCOUNTER — Encounter: Payer: Self-pay | Admitting: Oncology

## 2015-09-12 VITALS — BP 150/73 | HR 102 | Temp 96.4°F | Resp 18 | Ht 66.0 in | Wt 267.4 lb

## 2015-09-12 DIAGNOSIS — G4700 Insomnia, unspecified: Secondary | ICD-10-CM

## 2015-09-12 DIAGNOSIS — Z9221 Personal history of antineoplastic chemotherapy: Secondary | ICD-10-CM

## 2015-09-12 DIAGNOSIS — Z803 Family history of malignant neoplasm of breast: Secondary | ICD-10-CM

## 2015-09-12 DIAGNOSIS — E079 Disorder of thyroid, unspecified: Secondary | ICD-10-CM | POA: Diagnosis not present

## 2015-09-12 DIAGNOSIS — E669 Obesity, unspecified: Secondary | ICD-10-CM | POA: Diagnosis not present

## 2015-09-12 DIAGNOSIS — M26609 Unspecified temporomandibular joint disorder, unspecified side: Secondary | ICD-10-CM

## 2015-09-12 DIAGNOSIS — K5900 Constipation, unspecified: Secondary | ICD-10-CM | POA: Diagnosis not present

## 2015-09-12 DIAGNOSIS — F329 Major depressive disorder, single episode, unspecified: Secondary | ICD-10-CM | POA: Diagnosis not present

## 2015-09-12 DIAGNOSIS — F419 Anxiety disorder, unspecified: Secondary | ICD-10-CM

## 2015-09-12 DIAGNOSIS — Z79899 Other long term (current) drug therapy: Secondary | ICD-10-CM

## 2015-09-12 DIAGNOSIS — C8190 Hodgkin lymphoma, unspecified, unspecified site: Secondary | ICD-10-CM

## 2015-09-12 DIAGNOSIS — Z8669 Personal history of other diseases of the nervous system and sense organs: Secondary | ICD-10-CM | POA: Diagnosis not present

## 2015-09-12 DIAGNOSIS — C8198 Hodgkin lymphoma, unspecified, lymph nodes of multiple sites: Secondary | ICD-10-CM

## 2015-09-12 DIAGNOSIS — E785 Hyperlipidemia, unspecified: Secondary | ICD-10-CM

## 2015-09-12 DIAGNOSIS — N915 Oligomenorrhea, unspecified: Secondary | ICD-10-CM

## 2015-09-12 DIAGNOSIS — K219 Gastro-esophageal reflux disease without esophagitis: Secondary | ICD-10-CM | POA: Diagnosis not present

## 2015-09-12 DIAGNOSIS — Z8572 Personal history of non-Hodgkin lymphomas: Secondary | ICD-10-CM

## 2015-09-12 DIAGNOSIS — E559 Vitamin D deficiency, unspecified: Secondary | ICD-10-CM

## 2015-09-12 DIAGNOSIS — Z923 Personal history of irradiation: Secondary | ICD-10-CM

## 2015-09-12 LAB — COMPREHENSIVE METABOLIC PANEL
ALK PHOS: 60 U/L (ref 38–126)
ALT: 18 U/L (ref 14–54)
AST: 20 U/L (ref 15–41)
Albumin: 3.9 g/dL (ref 3.5–5.0)
Anion gap: 8 (ref 5–15)
BUN: 14 mg/dL (ref 6–20)
CHLORIDE: 99 mmol/L — AB (ref 101–111)
CO2: 27 mmol/L (ref 22–32)
CREATININE: 0.65 mg/dL (ref 0.44–1.00)
Calcium: 7.7 mg/dL — ABNORMAL LOW (ref 8.9–10.3)
GFR calc Af Amer: >60 mL/min (ref >60)
Glucose, Bld: 116 mg/dL — ABNORMAL HIGH (ref 65–99)
Potassium: 3.2 mmol/L — ABNORMAL LOW (ref 3.5–5.1)
Sodium: 134 mmol/L — ABNORMAL LOW (ref 135–145)
Total Bilirubin: 0.3 mg/dL (ref 0.3–1.2)
Total Protein: 7.3 g/dL (ref 6.5–8.1)

## 2015-09-12 LAB — CBC WITH DIFFERENTIAL/PLATELET
BASOS ABS: 0.1 10*3/uL (ref 0–0.1)
BASOS PCT: 1 %
Eosinophils Absolute: 0.4 10*3/uL (ref 0–0.7)
Eosinophils Relative: 4 %
HEMATOCRIT: 32.9 % — AB (ref 35.0–47.0)
HEMOGLOBIN: 10.8 g/dL — AB (ref 12.0–16.0)
Lymphocytes Relative: 22 %
Lymphs Abs: 2.4 10*3/uL (ref 1.0–3.6)
MCH: 23.4 pg — ABNORMAL LOW (ref 26.0–34.0)
MCHC: 32.8 g/dL (ref 32.0–36.0)
MCV: 71.4 fL — ABNORMAL LOW (ref 80.0–100.0)
MONOS PCT: 6 %
Monocytes Absolute: 0.7 10*3/uL (ref 0.2–0.9)
NEUTROS ABS: 7.6 10*3/uL — AB (ref 1.4–6.5)
NEUTROS PCT: 67 %
Platelets: 306 10*3/uL (ref 150–440)
RBC: 4.61 MIL/uL (ref 3.80–5.20)
RDW: 16.1 % — ABNORMAL HIGH (ref 11.5–14.5)
WBC: 11.2 10*3/uL — ABNORMAL HIGH (ref 3.6–11.0)

## 2015-09-12 LAB — SEDIMENTATION RATE: Sed Rate: 59 mm/hr — ABNORMAL HIGH (ref 0–20)

## 2015-09-12 LAB — LACTATE DEHYDROGENASE: LDH: 127 U/L (ref 98–192)

## 2015-09-12 MED ORDER — SODIUM CHLORIDE 0.9% FLUSH
10.0000 mL | INTRAVENOUS | Status: DC | PRN
  Administered 2015-09-12: 10 mL via INTRAVENOUS
  Filled 2015-09-12: qty 10

## 2015-09-12 MED ORDER — HEPARIN SOD (PORK) LOCK FLUSH 100 UNIT/ML IV SOLN
INTRAVENOUS | Status: AC
  Filled 2015-09-12: qty 5

## 2015-09-12 MED ORDER — HEPARIN SOD (PORK) LOCK FLUSH 100 UNIT/ML IV SOLN
500.0000 [IU] | Freq: Once | INTRAVENOUS | Status: AC
Start: 2015-09-12 — End: 2015-09-12
  Administered 2015-09-12: 500 [IU] via INTRAVENOUS

## 2015-09-12 NOTE — Progress Notes (Signed)
Former pt of [facility].  No concerns other than PET scan results from July 20th.  No changes since last visit.

## 2015-09-21 LAB — LIPID PANEL
CHOLESTEROL: 187 mg/dL (ref 0–200)
HDL: 56 mg/dL (ref 35–70)
LDL Cholesterol: 97 mg/dL
TRIGLYCERIDES: 171 mg/dL — AB (ref 40–160)

## 2015-09-21 LAB — HEMOGLOBIN A1C: Hemoglobin A1C: 6

## 2015-09-21 LAB — BASIC METABOLIC PANEL: CREATININE: 0.8 mg/dL (ref 0.5–1.1)

## 2015-10-06 ENCOUNTER — Ambulatory Visit: Payer: 59 | Admitting: Hematology and Oncology

## 2015-10-06 ENCOUNTER — Other Ambulatory Visit: Payer: 59

## 2015-10-23 ENCOUNTER — Encounter: Payer: Self-pay | Admitting: *Deleted

## 2015-10-26 ENCOUNTER — Encounter: Payer: Self-pay | Admitting: Family Medicine

## 2015-10-26 ENCOUNTER — Ambulatory Visit (INDEPENDENT_AMBULATORY_CARE_PROVIDER_SITE_OTHER): Payer: 59 | Admitting: General Surgery

## 2015-10-26 ENCOUNTER — Encounter: Payer: Self-pay | Admitting: General Surgery

## 2015-10-26 ENCOUNTER — Ambulatory Visit (INDEPENDENT_AMBULATORY_CARE_PROVIDER_SITE_OTHER): Payer: 59 | Admitting: Family Medicine

## 2015-10-26 VITALS — BP 122/66 | HR 88 | Resp 16 | Ht 66.0 in | Wt 262.0 lb

## 2015-10-26 VITALS — BP 114/67 | HR 104 | Temp 97.9°F | Resp 16 | Ht 66.0 in | Wt 262.4 lb

## 2015-10-26 DIAGNOSIS — E8881 Metabolic syndrome: Secondary | ICD-10-CM

## 2015-10-26 DIAGNOSIS — K219 Gastro-esophageal reflux disease without esophagitis: Secondary | ICD-10-CM

## 2015-10-26 DIAGNOSIS — E890 Postprocedural hypothyroidism: Secondary | ICD-10-CM

## 2015-10-26 DIAGNOSIS — E039 Hypothyroidism, unspecified: Secondary | ICD-10-CM

## 2015-10-26 DIAGNOSIS — Z91018 Allergy to other foods: Secondary | ICD-10-CM | POA: Diagnosis not present

## 2015-10-26 DIAGNOSIS — E892 Postprocedural hypoparathyroidism: Secondary | ICD-10-CM

## 2015-10-26 DIAGNOSIS — E559 Vitamin D deficiency, unspecified: Secondary | ICD-10-CM | POA: Diagnosis not present

## 2015-10-26 DIAGNOSIS — N944 Primary dysmenorrhea: Secondary | ICD-10-CM

## 2015-10-26 DIAGNOSIS — C8190 Hodgkin lymphoma, unspecified, unspecified site: Secondary | ICD-10-CM

## 2015-10-26 DIAGNOSIS — G2581 Restless legs syndrome: Secondary | ICD-10-CM

## 2015-10-26 DIAGNOSIS — Z23 Encounter for immunization: Secondary | ICD-10-CM

## 2015-10-26 DIAGNOSIS — N979 Female infertility, unspecified: Secondary | ICD-10-CM

## 2015-10-26 DIAGNOSIS — F411 Generalized anxiety disorder: Secondary | ICD-10-CM

## 2015-10-26 DIAGNOSIS — E209 Hypoparathyroidism, unspecified: Secondary | ICD-10-CM

## 2015-10-26 DIAGNOSIS — F339 Major depressive disorder, recurrent, unspecified: Secondary | ICD-10-CM

## 2015-10-26 MED ORDER — PHENTERMINE-TOPIRAMATE ER 7.5-46 MG PO CP24: 1.0000 | ORAL_CAPSULE | Freq: Every day | ORAL | 0 refills | Status: DC

## 2015-10-26 MED ORDER — HYDROXYZINE HCL 25 MG PO TABS: 25.0000 mg | ORAL_TABLET | Freq: Four times a day (QID) | ORAL | 0 refills | Status: DC | PRN

## 2015-10-26 MED ORDER — PHENTERMINE-TOPIRAMATE ER 3.75-23 MG PO CP24: 1.0000 | ORAL_CAPSULE | Freq: Every day | ORAL | 0 refills | Status: DC

## 2015-10-26 MED ORDER — DULOXETINE HCL 30 MG PO CPEP: 90.0000 mg | ORAL_CAPSULE | Freq: Every day | ORAL | 1 refills | Status: DC

## 2015-10-26 NOTE — Patient Instructions (Addendum)
The patient is aware to call back for any questions or concerns. Keep area clean May shower May remove outer dressing in 2-3 days steri strips will gradually come off over the next 2 weeks

## 2015-10-26 NOTE — Progress Notes (Addendum)
Patient ID: Jo [name], female   DOB: 1980/08/31, 35 y.o.   MRN: [id number]  Chief Complaint  Patient presents with  . Procedure    port removal    HPI Jo [name] is a 35 y.o. female.  Here today for port removal. PT is in remission from Hodgkin's. Okayed by oncology for port removal I have reviewed the history of present illness with the patient.  HPI  Past Medical History:  Diagnosis Date  . Anxiety   . Constipation   . Cramp of limb   . Depression   . GERD (gastroesophageal reflux disease)   . Hives   . Hodgkin disease ([place]) 2001 and 2014   chemo and rad tx in 2001 and 2014  . Hodgkin's disease in remission ([place])   . Hyperlipidemia   . Insomnia   . Lymphoma, Hodgkin's ([place])    2001, 2014  . Migraine   . Obesity   . Oligomenorrhea   . Primary female infertility   . Screening mammogram for high-risk patient   . Seborrhea   . Snoring   . Thyroid disease   . TMJ (dislocation of temporomandibular joint)   . Vitamin D deficiency     Past Surgical History:  Procedure Laterality Date  . lymphonodi biopsy    . PORT-A-CATH REMOVAL  2004  . PORTACATH PLACEMENT  11-24-12  . THYROID SURGERY    . THYROIDECTOMY  10-13-12  . uterine poly removal Left 08/2010   [facility]    Family History  Problem Relation Age of Onset  . Hypertension Mother   . Diabetes Father   . Heart disease Father   . CAD Father   . Diabetes Brother   . Breast cancer Paternal Aunt     great aunt >50  . Breast cancer Cousin   . CAD Maternal Grandmother   . Parkinson's disease Maternal Grandmother   . Parkinson's disease Maternal Grandfather   . CAD Paternal Grandmother     Social History Social History  Substance Use Topics  . Smoking status: Former Smoker    Packs/day: 1.00    Years: 0.00    Types: Cigarettes    Quit date: 07/27/2009  . Smokeless tobacco: Never Used  . Alcohol use 0.0 oz/week     Comment: rarely    Allergies  Allergen Reactions  . Tape Rash    Uncoded  Allergy. Allergen: Msg  . Iodinated Diagnostic Agents Hives    Uncoded Allergy. Allergen: CATS  . Barium-Containing Compounds Hives  . Monosodium Glutamate     Unknown.  . Other Rash    Contrast dye    Current Outpatient Prescriptions  Medication Sig Dispense Refill  . Biotin 1 MG CAPS Take by mouth.    . calcitRIOL (ROCALTROL) 0.5 MCG capsule     . calcium acetate (PHOSLO) 667 MG tablet Take by mouth.    . desonide (DESOWEN) 0.05 % cream Apply two times daily 180 g 0  . DULoxetine (CYMBALTA) 30 MG capsule Take 3 capsules (90 mg total) by mouth daily. 270 capsule 1  . EPINEPHrine 0.3 mg/0.3 mL IJ SOAJ injection     . fexofenadine (ALLEGRA) 180 MG tablet Take by mouth.    . hydrOXYzine (ATARAX/VISTARIL) 25 MG tablet Take 1 tablet (25 mg total) by mouth every 6 (six) hours as needed. 30 tablet 0  . levothyroxine (SYNTHROID, LEVOTHROID) 112 MCG tablet Take 2 tablets (224 mcg total) by mouth daily. 90 tablet 1  . Mefenamic Acid 250  MG CAPS     . omeprazole (PRILOSEC) 20 MG capsule Take 1 capsule (20 mg total) by mouth daily. 90 capsule 1  . Phentermine-Topiramate (QSYMIA) 3.75-23 MG CP24 Take 1 capsule by mouth daily. 14 capsule 0  . Phentermine-Topiramate (QSYMIA) 7.5-46 MG CP24 Take 1 capsule by mouth daily. 30 capsule 0  . ALPRAZolam (XANAX XR) 1 MG 24 hr tablet TAKE 1 TABLET BY MOUTH DAILY 30 tablet 0   No current facility-administered medications for this visit.     Review of Systems Review of Systems  Constitutional: Negative.   Respiratory: Negative.   Cardiovascular: Negative.     Blood pressure 122/66, pulse 88, resp. rate 16, height 5\' 6"  (1.676 m), weight 262 lb (118.8 kg), last menstrual period 10/23/2015.  Physical Exam Physical Exam  Constitutional: She is oriented to person, place, and time. She appears well-developed and well-nourished.  Neurological: She is alert and oriented to person, place, and time.  Skin: Skin is warm and dry.  Psychiatric: Her behavior  is normal.    Data Reviewed Progress notes  Assessment    History of Hodgkin's lymphoma    Plan   Procedure: Removal of venous access port  10 mL of 0.5% Marcaine mixed with 1% Xylocaine used for anesthetic. Area was prepped with ChloraPrep. Previous skin incision was reopened and the capsule surrounding the port was incised. The anchoring stitches of Prolene were removed. The port and the catheter was removed in its entirety without any difficulty.No bleeding was encountered. Subcutaneous tissue closed with 3-0 Vicryl and the skin with subcuticular 4-0 Vicryl reinforced with Steri-Strips. Telfa and Tegaderm dressings were placed. No immediate problems from the procedure    Keep area clean May shower May remove outer dressing in 2-3 days steri strips will gradually come off over the next 2 weeks Follow up as needed.  This information has been scribed by Karie Fetch RN, BSN,BC. The patient is aware to call back for any questions or concerns.   Linden Tagliaferro G 11/28/2015, 8:22 AM

## 2015-10-26 NOTE — Addendum Note (Signed)
Addended by: Inda Coke on: 10/26/2015 10:33 AM   Modules accepted: Orders

## 2015-10-26 NOTE — Progress Notes (Signed)
Name: Jo [name]   MRN: [id number]    DOB: [birth year]   Date:10/26/2015       Progress Note  Subjective  Chief Complaint  Chief Complaint  Patient presents with  . Medication Refill    3 month F/U   . Depression    Patient states it has been rough  . Gastroesophageal Reflux    Well controlled   . Muscle Cramps    Would like to discuss them since patient has been experiencing them more with the weather change the past couple of weeks  . Anxiety    Patient states they come in spurts  . Hypothyroidism    Cold Intolerance, Dry Skin    HPI  Major Depression recurrent: she is still getting therapy once a week, taking Cymbalta, but stress is higher because husband wants to pay off debt - and she does not feel like not communicating well lately. She wants to adopt now, but because of her relationship with husband she is not so sure. No suicidal thoughts or ideation  Headaches: she has a dull aching that radiates from neck to frontal area, history of migraine headache but no recent episodes  No nausea, vomiting or photophobia. Almost daily but worse before her cycles. At most once a week    GAD: she takes Alprazolam XR daily and prn xanax immediate release. Still worries all the time, has problems sleeping, she has a short temper at times. Stable with Cymbalta and Xanax XR  GERD: under control with medication, no heartburn or regurgitation.   Hypothyroidism: she sees Dr. Gabriel Carina but states she would like to be monitored at our office, because of cost. She is currently on Synthroid 226mcg, s/p thyroidectomy , TSH is at goal now.   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 We will repeat labs   Hodgkins lymphoma: she sees Dr. Grayland Ormond now, last relapse was in 2013  Patient Active Problem List   Diagnosis Date Noted  . Tension type headache 04/27/2015  . GAD (generalized  anxiety disorder) 10/18/2014  . RLS (restless legs syndrome) 10/18/2014  . Insomnia, persistent 10/18/2014  . Migraine without aura and responsive to treatment 10/18/2014  . CN (constipation) 10/18/2014  . Major depression, recurrent, chronic ([place]) 10/18/2014  . Dysmenorrhea 10/18/2014  . Female infertility 10/18/2014  . Gastro-esophageal reflux disease without esophagitis 10/18/2014  . Allergy, food 10/18/2014  . Hodgkin lymphoma, unspecified, unspecified site ([place]) 10/18/2014  . H/O thyroidectomy 10/18/2014  . Metabolic syndrome [id number]  . Extreme obesity ([place]) 10/18/2014  . Low parathyroid level after removal ([place]) 10/18/2014  . Vitamin D deficiency 10/18/2014  . Hive 10/18/2014  . Dermatitis seborrheica 10/18/2014  . Arthralgia of temporomandibular joint 10/18/2014  . Acquired hypothyroidism 10/13/2012    Past Surgical History:  Procedure Laterality Date  . lymphonodi biopsy    . PORT-A-CATH REMOVAL  2004  . PORTACATH PLACEMENT  11-24-12  . THYROID SURGERY    . THYROIDECTOMY  10-13-12  . uterine poly removal Left 08/2010   [facility]    Family History  Problem Relation Age of Onset  . Hypertension Mother   . Diabetes Father   . Heart disease Father   . CAD Father   . Diabetes Brother   . Breast cancer Paternal Aunt     great aunt >50  . Breast cancer Cousin   . CAD Maternal Grandmother   . Parkinson's disease  Maternal Grandmother   . Parkinson's disease Maternal Grandfather   . CAD Paternal Grandmother     Social History   Social History  . Marital status: Married    Spouse name: N/A  . Number of children: N/A  . Years of education: N/A   Occupational History  . Not on file.   Social History Main Topics  . Smoking status: Former Smoker    Packs/day: 1.00    Years: 0.00    Types: Cigarettes    Quit date: 07/27/2009  . Smokeless tobacco: Never Used  . Alcohol use 0.0 oz/week     Comment: rarely  . Drug use: No  . Sexual activity: Yes   Other Topics  Concern  . Not on file   Social History Narrative  . No narrative on file     Current Outpatient Prescriptions:  .  ALPRAZolam (XANAX XR) 1 MG 24 hr tablet, Take 1 tablet (1 mg total) by mouth daily., Disp: 30 tablet, Rfl: 2 .  ALPRAZolam (XANAX) 0.5 MG tablet, Take 1 tablet (0.5 mg total) by mouth at bedtime as needed. for sleep, Disp: 30 tablet, Rfl: 0 .  Biotin 1 MG CAPS, Take by mouth., Disp: , Rfl:  .  calcitRIOL (ROCALTROL) 0.5 MCG capsule, , Disp: , Rfl:  .  calcium acetate (PHOSLO) 667 MG tablet, Take by mouth., Disp: , Rfl:  .  desonide (DESOWEN) 0.05 % cream, Apply two times daily, Disp: 180 g, Rfl: 0 .  DULoxetine (CYMBALTA) 30 MG capsule, Take 3 capsules (90 mg total) by mouth daily., Disp: 270 capsule, Rfl: 1 .  EPINEPHrine 0.3 mg/0.3 mL IJ SOAJ injection, , Disp: , Rfl:  .  fexofenadine (ALLEGRA) 180 MG tablet, Take by mouth., Disp: , Rfl:  .  hydrOXYzine (ATARAX/VISTARIL) 25 MG tablet, Take 1 tablet (25 mg total) by mouth every 6 (six) hours as needed., Disp: 30 tablet, Rfl: 0 .  levothyroxine (SYNTHROID, LEVOTHROID) 112 MCG tablet, Take 2 tablets (224 mcg total) by mouth daily., Disp: 90 tablet, Rfl: 1 .  Mefenamic Acid 250 MG CAPS, , Disp: , Rfl:  .  omeprazole (PRILOSEC) 20 MG capsule, Take 1 capsule (20 mg total) by mouth daily., Disp: 90 capsule, Rfl: 1  Allergies  Allergen Reactions  . Tape Rash    Uncoded Allergy. Allergen: Msg  . Iodinated Diagnostic Agents Hives    Uncoded Allergy. Allergen: CATS  . Barium-Containing Compounds Hives  . Monosodium Glutamate     Unknown.  . Other Rash    Contrast dye     ROS  Constitutional: Negative for fever or weight change.  Respiratory: Negative for cough and shortness of breath.   Cardiovascular: Negative for chest pain or palpitations.  Gastrointestinal: Negative for abdominal pain, no bowel changes.  Musculoskeletal: Negative for gait problem or joint swelling.  Skin: Negative for rash.  Neurological:  Negative for dizziness or headache.  No other specific complaints in a complete review of systems (except as listed in HPI above).  Objective  Vitals:   10/26/15 0930  BP: 114/67  Pulse: (!) 104  Resp: 16  Temp: 97.9 F (36.6 C)  TempSrc: Oral  SpO2: 96%  Weight: 262 lb 6.4 oz (119 kg)  Height: 5\' 6"  (1.676 m)    Body mass index is 42.35 kg/m.  Physical Exam  Constitutional: Patient appears well-developed and well-nourished. Obese  No distress.  HEENT: head atraumatic, normocephalic, pupils equal and reactive to light,neck supple, throat within normal limits Cardiovascular: Normal  rate, regular rhythm and normal heart sounds.  No murmur heard. No BLE edema. Pulmonary/Chest: Effort normal and breath sounds normal. No respiratory distress. Abdominal: Soft.  There is no tenderness. Psychiatric: Patient has a normal mood and affect. behavior is normal. Judgment and thought content normal.   PHQ2/9: Depression screen [facility] 2/9 10/26/2015 07/28/2015 01/19/2015 10/20/2014  Decreased Interest 2 0 0 0  Down, Depressed, Hopeless 1 0 1 2  PHQ - 2 Score 3 0 1 2  Altered sleeping 1 - - 0  Tired, decreased energy 3 - - 1  Change in appetite 2 - - 0  Feeling bad or failure about yourself  1 - - 1  Trouble concentrating 1 - - 1  Moving slowly or fidgety/restless 0 - - 0  Suicidal thoughts 0 - - 0  PHQ-9 Score 11 - - 5  Difficult doing work/chores Somewhat difficult - - Not difficult at all     Fall Risk: Fall Risk  10/26/2015 07/28/2015 04/27/2015 01/19/2015 10/20/2014  Falls in the past year? No No No No No      Functional Status Survey: Is the patient deaf or have difficulty hearing?: No Does the patient have difficulty seeing, even when wearing glasses/contacts?: No Does the patient have difficulty concentrating, remembering, or making decisions?: No Does the patient have difficulty walking or climbing stairs?: No Does the patient have difficulty dressing or bathing?: No Does the  patient have difficulty doing errands alone such as visiting a doctor's office or shopping?: No    Assessment & Plan  1. Hodgkin lymphoma, unspecified Hodgkin lymphoma type, unspecified body region ([place])  - CBC with Differential/Platelet  2. GAD (generalized anxiety disorder)  - DULoxetine (CYMBALTA) 30 MG capsule; Take 3 capsules (90 mg total) by mouth daily.  Dispense: 270 capsule; Refill: 1  3. Major depression, recurrent, chronic (HCC)  - DULoxetine (CYMBALTA) 30 MG capsule; Take 3 capsules (90 mg total) by mouth daily.  Dispense: 270 capsule; Refill: 1 Discussed marital counseling  4. Acquired hypothyroidism  - TSH  5. Morbid obesity, unspecified obesity type [facility])  Discussed with the patient the risk posed by an increased BMI. Discussed importance of portion control, calorie counting and at least 150 minutes of physical activity weekly. Avoid sweet beverages and drink more water. Eat at least 6 servings of fruit and vegetables daily   - Phentermine-Topiramate (QSYMIA) 3.75-23 MG CP24; Take 1 capsule by mouth daily.  Dispense: 14 capsule; Refill: 0 - Phentermine-Topiramate (QSYMIA) 7.5-46 MG CP24; Take 1 capsule by mouth daily.  Dispense: 30 capsule; Refill: 0 - Amb ref to Medical Nutrition Therapy-MNT  Discussed possible side effects  6. Gastro-esophageal reflux disease without esophagitis  Continue medication  7. Infertility, female  Thinking about adoptions, but husband wants to be out of debt first  8. Allergy, food  - hydrOXYzine (ATARAX/VISTARIL) 25 MG tablet; Take 1 tablet (25 mg total) by mouth every 6 (six) hours as needed.  Dispense: 30 tablet; Refill: 0  9. Metabolic syndrome  Last [id number] done at work   10. Vitamin D deficiency  - VITAMIN D 25 Hydroxy (Vit-D Deficiency, Fractures)  11. 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 RLS (restless legs syndrome)  Continue prn medication   13. Primary  dysmenorrhea  She states she can go without hydrocodone  14. Hypocalcemia  - Calcium, ionized  15. Needs flu shot  - Flu Vaccine QUAD 36+ mos IM -refused

## 2015-10-31 LAB — CBC WITH DIFFERENTIAL/PLATELET
BASOS: 0 %
Basophils Absolute: 0.1 10*3/uL (ref 0.0–0.2)
EOS (ABSOLUTE): 0.4 10*3/uL (ref 0.0–0.4)
Eos: 3 %
HEMOGLOBIN: 12.1 g/dL (ref 11.1–15.9)
Hematocrit: 37 % (ref 34.0–46.6)
IMMATURE GRANS (ABS): 0 10*3/uL (ref 0.0–0.1)
Immature Granulocytes: 0 %
LYMPHS ABS: 2.6 10*3/uL (ref 0.7–3.1)
LYMPHS: 19 %
MCH: 23.8 pg — AB (ref 26.6–33.0)
MCHC: 32.7 g/dL (ref 31.5–35.7)
MCV: 73 fL — AB (ref 79–97)
MONOCYTES: 6 %
Monocytes Absolute: 0.9 10*3/uL (ref 0.1–0.9)
NEUTROS ABS: 10.1 10*3/uL — AB (ref 1.4–7.0)
Neutrophils: 72 %
Platelets: 332 10*3/uL (ref 150–379)
RBC: 5.08 x10E6/uL (ref 3.77–5.28)
RDW: 17 % — ABNORMAL HIGH (ref 12.3–15.4)
WBC: 14.1 10*3/uL — ABNORMAL HIGH (ref 3.4–10.8)

## 2015-10-31 LAB — VITAMIN D 25 HYDROXY (VIT D DEFICIENCY, FRACTURES): VIT D 25 HYDROXY: 36.8 ng/mL (ref 30.0–100.0)

## 2015-10-31 LAB — CALCIUM, IONIZED: CALCIUM ION: 4.6 mg/dL (ref 4.5–5.6)

## 2015-10-31 LAB — TSH: TSH: 0.562 u[IU]/mL (ref 0.450–4.500)

## 2015-11-01 ENCOUNTER — Encounter: Payer: Self-pay | Admitting: General Surgery

## 2015-11-22 ENCOUNTER — Other Ambulatory Visit: Payer: Self-pay | Admitting: Family Medicine

## 2015-11-22 DIAGNOSIS — F411 Generalized anxiety disorder: Secondary | ICD-10-CM

## 2015-11-22 NOTE — Telephone Encounter (Signed)
Patient requesting refill of Alprazolam to [facility].  

## 2015-12-06 ENCOUNTER — Ambulatory Visit (INDEPENDENT_AMBULATORY_CARE_PROVIDER_SITE_OTHER): Payer: 59 | Admitting: Family Medicine

## 2015-12-06 ENCOUNTER — Encounter: Payer: Self-pay | Admitting: Family Medicine

## 2015-12-06 MED ORDER — LORCASERIN HCL ER 20 MG PO TB24: 1.0000 | ORAL_TABLET | Freq: Every day | ORAL | 2 refills | Status: DC

## 2015-12-06 NOTE — Progress Notes (Signed)
Name: Jo [name]   MRN: [id number]    DOB: 25-Apr-1980   Date:12/06/2015       Progress Note  Subjective  Chief Complaint  Chief Complaint  Patient presents with  . Follow-up    6 week F/U  . Obesity    Patient has lost 19 pounds since last visit, by starting Qsymia eating healthier and walking everyday.     HPI  Obesity: she has always struggled with weight, however she was stuck at 262 lbs, I referred her to a dietician but she could not afford it however her co-worker is a [employer] representative and is helping her with ways to eat healthy. She has also started exercising over the past 2 weeks and Qsymia has really helped curb her appetite, however she can't afford medication. Too expensive.   Patient Active Problem List   Diagnosis Date Noted  . Tension type headache 04/27/2015  . GAD (generalized anxiety disorder) 10/18/2014  . RLS (restless legs syndrome) 10/18/2014  . Insomnia, persistent 10/18/2014  . Migraine without aura and responsive to treatment 10/18/2014  . CN (constipation) 10/18/2014  . Major depression, recurrent, chronic ([place]) 10/18/2014  . Dysmenorrhea 10/18/2014  . Female infertility 10/18/2014  . Gastro-esophageal reflux disease without esophagitis 10/18/2014  . Allergy, food 10/18/2014  . Hodgkin lymphoma, unspecified, unspecified site ([place]) 10/18/2014  . H/O thyroidectomy 10/18/2014  . Metabolic syndrome 66/59/9357  . Extreme obesity ([place]) 10/18/2014  . Low parathyroid level after removal ([place]) 10/18/2014  . Vitamin D deficiency 10/18/2014  . Hive 10/18/2014  . Dermatitis seborrheica 10/18/2014  . Arthralgia of temporomandibular joint 10/18/2014  . Acquired hypothyroidism 10/13/2012    Past Surgical History:  Procedure Laterality Date  . lymphonodi biopsy    . PORT-A-CATH REMOVAL  2004  . PORTACATH PLACEMENT  11-24-12  . THYROID SURGERY    . THYROIDECTOMY  10-13-12  . uterine poly removal Left 08/2010   [facility]    Family History   Problem Relation Age of Onset  . Hypertension Mother   . Diabetes Father   . Heart disease Father   . CAD Father   . Diabetes Brother   . Breast cancer Paternal Aunt     great aunt >50  . Breast cancer Cousin   . CAD Maternal Grandmother   . Parkinson's disease Maternal Grandmother   . Parkinson's disease Maternal Grandfather   . CAD Paternal Grandmother     Social History   Social History  . Marital status: Married    Spouse name: N/A  . Number of children: N/A  . Years of education: N/A   Occupational History  . Not on file.   Social History Main Topics  . Smoking status: Former Smoker    Packs/day: 1.00    Years: 0.00    Types: Cigarettes    Quit date: 07/27/2009  . Smokeless tobacco: Never Used  . Alcohol use 0.0 oz/week     Comment: rarely  . Drug use: No  . Sexual activity: Yes   Other Topics Concern  . Not on file   Social History Narrative  . No narrative on file     Current Outpatient Prescriptions:  .  ALPRAZolam (XANAX XR) 1 MG 24 hr tablet, TAKE 1 TABLET BY MOUTH DAILY, Disp: 30 tablet, Rfl: 0 .  Biotin 1 MG CAPS, Take by mouth., Disp: , Rfl:  .  calcitRIOL (ROCALTROL) 0.5 MCG capsule, , Disp: , Rfl:  .  calcium acetate (PHOSLO) 667 MG tablet,  Take by mouth., Disp: , Rfl:  .  desonide (DESOWEN) 0.05 % cream, Apply two times daily, Disp: 180 g, Rfl: 0 .  DULoxetine (CYMBALTA) 30 MG capsule, Take 3 capsules (90 mg total) by mouth daily., Disp: 270 capsule, Rfl: 1 .  EPINEPHrine 0.3 mg/0.3 mL IJ SOAJ injection, , Disp: , Rfl:  .  fexofenadine (ALLEGRA) 180 MG tablet, Take by mouth., Disp: , Rfl:  .  hydrOXYzine (ATARAX/VISTARIL) 25 MG tablet, Take 1 tablet (25 mg total) by mouth every 6 (six) hours as needed., Disp: 30 tablet, Rfl: 0 .  levothyroxine (SYNTHROID, LEVOTHROID) 112 MCG tablet, Take 2 tablets (224 mcg total) by mouth daily., Disp: 90 tablet, Rfl: 1 .  Mefenamic Acid 250 MG CAPS, , Disp: , Rfl:  .  omeprazole (PRILOSEC) 20 MG capsule,  Take 1 capsule (20 mg total) by mouth daily., Disp: 90 capsule, Rfl: 1  Allergies  Allergen Reactions  . Tape Rash    Uncoded Allergy. Allergen: Msg  . Iodinated Diagnostic Agents Hives    Uncoded Allergy. Allergen: CATS  . Barium-Containing Compounds Hives  . Monosodium Glutamate     Unknown.  . Other Rash    Contrast dye     ROS  Constitutional: Negative for fever , positive for  weight change.  Respiratory: Negative for cough and shortness of breath.   Cardiovascular: Negative for chest pain or palpitations.  Gastrointestinal: Negative for abdominal pain, no bowel changes.  Musculoskeletal: Negative for gait problem or joint swelling.  Skin: Negative for rash.  Neurological: Negative for dizziness or headache.  No other specific complaints in a complete review of systems (except as listed in HPI above).  Objective  Vitals:   12/06/15 0942  BP: 108/68  Pulse: 96  Resp: 16  Temp: 98.2 F (36.8 C)  TempSrc: Oral  SpO2: 98%  Weight: 243 lb 4.8 oz (110.4 kg)  Height: '5\' 6"'  (1.676 m)    Body mass index is 39.27 kg/m.  Physical Exam  Constitutional: Patient appears well-developed and well-nourished. Obese  No distress.  HEENT: head atraumatic, normocephalic, pupils equal and reactive to light,  neck supple, throat within normal limits Cardiovascular: Normal rate, regular rhythm and normal heart sounds.  No murmur heard. No BLE edema. Pulmonary/Chest: Effort normal and breath sounds normal. No respiratory distress. Abdominal: Soft.  There is no tenderness. Psychiatric: Patient has a normal mood and affect. behavior is normal. Judgment and thought content normal.  Recent Results (from the past 2160 hour(s))  CBC with Differential     Status: Abnormal   Collection Time: 09/12/15  3:40 PM  Result Value Ref Range   WBC 11.2 (H) 3.6 - 11.0 K/uL   RBC 4.61 3.80 - 5.20 MIL/uL   Hemoglobin 10.8 (L) 12.0 - 16.0 g/dL   HCT 32.9 (L) 35.0 - 47.0 %   MCV 71.4 (L) 80.0 -  100.0 fL   MCH 23.4 (L) 26.0 - 34.0 pg   MCHC 32.8 32.0 - 36.0 g/dL   RDW 16.1 (H) 11.5 - 14.5 %   Platelets 306 150 - 440 K/uL   Neutrophils Relative % 67 %   Neutro Abs 7.6 (H) 1.4 - 6.5 K/uL   Lymphocytes Relative 22 %   Lymphs Abs 2.4 1.0 - 3.6 K/uL   Monocytes Relative 6 %   Monocytes Absolute 0.7 0.2 - 0.9 K/uL   Eosinophils Relative 4 %   Eosinophils Absolute 0.4 0 - 0.7 K/uL   Basophils Relative 1 %  Basophils Absolute 0.1 0 - 0.1 K/uL  Comprehensive metabolic panel     Status: Abnormal   Collection Time: 09/12/15  3:40 PM  Result Value Ref Range   Sodium 134 (L) 135 - 145 mmol/L   Potassium 3.2 (L) 3.5 - 5.1 mmol/L   Chloride 99 (L) 101 - 111 mmol/L   CO2 27 22 - 32 mmol/L   Glucose, Bld 116 (H) 65 - 99 mg/dL   BUN 14 6 - 20 mg/dL   Creatinine, Ser 0.65 0.44 - 1.00 mg/dL   Calcium 7.7 (L) 8.9 - 10.3 mg/dL   Total Protein 7.3 6.5 - 8.1 g/dL   Albumin 3.9 3.5 - 5.0 g/dL   AST 20 15 - 41 U/L   ALT 18 14 - 54 U/L   Alkaline Phosphatase 60 38 - 126 U/L   Total Bilirubin 0.3 0.3 - 1.2 mg/dL   GFR calc non Af Amer >60 >60 mL/min   GFR calc Af Amer >60 >60 mL/min    Comment: (NOTE) The eGFR has been calculated using the CKD EPI equation. This calculation has not been validated in all clinical situations. eGFR's persistently <60 mL/min signify possible Chronic Kidney Disease.    Anion gap 8 5 - 15  Lactate dehydrogenase     Status: None   Collection Time: 09/12/15  3:40 PM  Result Value Ref Range   LDH 127 98 - 192 U/L  Sedimentation rate     Status: Abnormal   Collection Time: 09/12/15  3:40 PM  Result Value Ref Range   Sed Rate 59 (H) 0 - 20 mm/hr  Basic metabolic panel     Status: None   Collection Time: 09/21/15 12:00 AM  Result Value Ref Range   Creatinine 0.8 0.5 - 1.1 mg/dL  Lipid panel     Status: Abnormal   Collection Time: 09/21/15 12:00 AM  Result Value Ref Range   Triglycerides 171 (A) 40 - 160 mg/dL   Cholesterol 187 0 - 200 mg/dL   HDL 56 35 -  70 mg/dL   LDL Cholesterol 97 mg/dL  Hemoglobin A1c     Status: None   Collection Time: 09/21/15 12:00 AM  Result Value Ref Range   Hemoglobin A1C 6.0   TSH     Status: None   Collection Time: 10/30/15  2:34 PM  Result Value Ref Range   TSH 0.562 0.450 - 4.500 uIU/mL  VITAMIN D 25 Hydroxy (Vit-D Deficiency, Fractures)     Status: None   Collection Time: 10/30/15  2:34 PM  Result Value Ref Range   Vit D, 25-Hydroxy 36.8 30.0 - 100.0 ng/mL    Comment: Vitamin D deficiency has been defined by the [place] and an Endocrine Society practice guideline as a level of serum 25-OH vitamin D less than 20 ng/mL (1,2). The Endocrine Society went on to further define vitamin D insufficiency as a level between 21 and 29 ng/mL (2). 1. IOM (Institute of Medicine). 2010. Dietary reference    intakes for calcium and D. [place]: The    [employer]. 2. Holick MF, Binkley Sully, Bischoff-Ferrari HA, et al.    Evaluation, treatment, and prevention of vitamin D    deficiency: an Endocrine Society clinical practice    guideline. JCEM. 2011 Jul; 96(7):1911-30.   Calcium, ionized     Status: None   Collection Time: 10/30/15  2:34 PM  Result Value Ref Range   Calcium, Ion 4.6 4.5 - 5.6 mg/dL  CBC with Differential/Platelet     Status: Abnormal   Collection Time: 10/30/15  2:34 PM  Result Value Ref Range   WBC 14.1 (H) 3.4 - 10.8 x10E3/uL   RBC 5.08 3.77 - 5.28 x10E6/uL   Hemoglobin 12.1 11.1 - 15.9 g/dL   Hematocrit 37.0 34.0 - 46.6 %   MCV 73 (L) 79 - 97 fL   MCH 23.8 (L) 26.6 - 33.0 pg   MCHC 32.7 31.5 - 35.7 g/dL   RDW 17.0 (H) 12.3 - 15.4 %   Platelets 332 150 - 379 x10E3/uL   Neutrophils 72 %   Lymphs 19 %   Monocytes 6 %   Eos 3 %   Basos 0 %   Neutrophils Absolute 10.1 (H) 1.4 - 7.0 x10E3/uL   Lymphocytes Absolute 2.6 0.7 - 3.1 x10E3/uL   Monocytes Absolute 0.9 0.1 - 0.9 x10E3/uL   EOS (ABSOLUTE) 0.4 0.0 - 0.4 x10E3/uL   Basophils Absolute 0.1 0.0 - 0.2  x10E3/uL   Immature Granulocytes 0 %   Immature Grans (Abs) 0.0 0.0 - 0.1 x10E3/uL     PHQ2/9: Depression screen [facility] 2/9 10/26/2015 07/28/2015 01/19/2015 10/20/2014  Decreased Interest 2 0 0 0  Down, Depressed, Hopeless 1 0 1 2  PHQ - 2 Score 3 0 1 2  Altered sleeping 1 - - 0  Tired, decreased energy 3 - - 1  Change in appetite 2 - - 0  Feeling bad or failure about yourself  1 - - 1  Trouble concentrating 1 - - 1  Moving slowly or fidgety/restless 0 - - 0  Suicidal thoughts 0 - - 0  PHQ-9 Score 11 - - 5  Difficult doing work/chores Somewhat difficult - - Not difficult at all     Fall Risk: Fall Risk  10/26/2015 07/28/2015 04/27/2015 01/19/2015 10/20/2014  Falls in the past year? No No No No No     Assessment & Plan  1. Morbid obesity, unspecified obesity type ([place])  She can't afford Qsymia, we will try Belviq and continue life style modification  - Lorcaserin HCl ER (BELVIQ XR) 20 MG TB24; Take 1 tablet by mouth daily.  Dispense: 30 tablet; Refill: 2

## 2015-12-19 ENCOUNTER — Other Ambulatory Visit: Payer: Self-pay | Admitting: Family Medicine

## 2015-12-19 DIAGNOSIS — E039 Hypothyroidism, unspecified: Secondary | ICD-10-CM

## 2015-12-19 NOTE — Telephone Encounter (Signed)
Patient requesting refill of Synthroid to [facility].

## 2015-12-26 ENCOUNTER — Other Ambulatory Visit: Payer: Self-pay | Admitting: Family Medicine

## 2015-12-26 DIAGNOSIS — F411 Generalized anxiety disorder: Secondary | ICD-10-CM

## 2016-01-26 ENCOUNTER — Encounter: Payer: Self-pay | Admitting: Family Medicine

## 2016-01-26 ENCOUNTER — Ambulatory Visit (INDEPENDENT_AMBULATORY_CARE_PROVIDER_SITE_OTHER): Payer: 59 | Admitting: Family Medicine

## 2016-01-26 VITALS — BP 108/64 | HR 105 | Temp 98.2°F | Resp 14 | Wt 241.0 lb

## 2016-01-26 DIAGNOSIS — C8190 Hodgkin lymphoma, unspecified, unspecified site: Secondary | ICD-10-CM

## 2016-01-26 DIAGNOSIS — F411 Generalized anxiety disorder: Secondary | ICD-10-CM | POA: Diagnosis not present

## 2016-01-26 DIAGNOSIS — F339 Major depressive disorder, recurrent, unspecified: Secondary | ICD-10-CM | POA: Diagnosis not present

## 2016-01-26 DIAGNOSIS — Z79899 Other long term (current) drug therapy: Secondary | ICD-10-CM

## 2016-01-26 DIAGNOSIS — K219 Gastro-esophageal reflux disease without esophagitis: Secondary | ICD-10-CM

## 2016-01-26 DIAGNOSIS — E8881 Metabolic syndrome: Secondary | ICD-10-CM

## 2016-01-26 DIAGNOSIS — E039 Hypothyroidism, unspecified: Secondary | ICD-10-CM

## 2016-01-26 MED ORDER — ALPRAZOLAM ER 1 MG PO TB24: 1.0000 mg | ORAL_TABLET | Freq: Every day | ORAL | 2 refills | Status: DC

## 2016-01-26 MED ORDER — OMEPRAZOLE 20 MG PO CPDR: 20.0000 mg | DELAYED_RELEASE_CAPSULE | Freq: Every day | ORAL | 1 refills | Status: DC

## 2016-01-26 NOTE — Progress Notes (Signed)
Name: Jo [name]   MRN: [id number]    DOB: [birth year]   Date:01/26/2016       Progress Note  Subjective  Chief Complaint  Chief Complaint  Patient presents with  . Follow-up    3 months  . Medication Refill  . Depression  . Hypothyroidism    HPI  Major Depression recurrent: she is still getting therapy with Oretha Ellis  once a week, taking Cymbalta, more stress because her dog diet and father has been sick, but she is happy that they are almost paying off their debt and started their own saving for the adoption process. No suicidal thoughts or ideation  GAD: she takes Alprazolam XR daily and prn xanax immediate release. Still worries all the time, has problems sleeping, she has a short temper at times. Stable with Cymbalta and Xanax XR  GERD: under control with medication, no heartburn or regurgitation.   Hypothyroidism: she used to see  Dr. Gabriel Carina but now monitored here.  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 Grayland Ormond now, last relapse was in 2013, last WBC was higher than usual, and she wants to recheck it  Morbid obesity: she responded to Qsymia but can't afford it, we gave ger Belviq but she never filled prescription because of the cost    Patient Active Problem List   Diagnosis Date Noted  . Tension type headache 04/27/2015  . GAD (generalized anxiety disorder) 10/18/2014  . RLS (restless legs syndrome) 10/18/2014  . Insomnia, persistent 10/18/2014  . Migraine without aura and responsive to treatment 10/18/2014  . CN (constipation) 10/18/2014  . Major depression, recurrent, chronic ([place]) 10/18/2014  . Dysmenorrhea 10/18/2014  . Female infertility 10/18/2014  . Gastro-esophageal reflux disease without esophagitis 10/18/2014  . Allergy, food 10/18/2014  .  Hodgkin lymphoma, unspecified, unspecified site ([place]) 10/18/2014  . H/O thyroidectomy 10/18/2014  . Metabolic syndrome 99991111  . Extreme obesity ([place]) 10/18/2014  . Low parathyroid level after removal ([place]) 10/18/2014  . Vitamin D deficiency 10/18/2014  . Hive 10/18/2014  . Dermatitis seborrheica 10/18/2014  . Arthralgia of temporomandibular joint 10/18/2014  . Acquired hypothyroidism 10/13/2012    Past Surgical History:  Procedure Laterality Date  . lymphonodi biopsy    . PORT-A-CATH REMOVAL  2004  . PORTACATH PLACEMENT  11-24-12  . THYROID SURGERY    . THYROIDECTOMY  10-13-12  . uterine poly removal Left 08/2010   [facility]    Family History  Problem Relation Age of Onset  . Hypertension Mother   . Diabetes Father   . Heart disease Father   . CAD Father   . Diabetes Brother   . Breast cancer Paternal Aunt     great aunt >50  . Breast cancer Cousin   . CAD Maternal Grandmother   . Parkinson's disease Maternal Grandmother   . Parkinson's disease Maternal Grandfather   . CAD Paternal Grandmother     Social History   Social History  . Marital status: Married    Spouse name: N/A  . Number of children: N/A  . Years of education: N/A   Occupational History  . Not on file.   Social History Main Topics  . Smoking status: Former Smoker    Packs/day: 1.00    Years: 0.00    Types: Cigarettes  Quit date: 07/27/2009  . Smokeless tobacco: Never Used  . Alcohol use 0.0 oz/week     Comment: rarely  . Drug use: No  . Sexual activity: Yes   Other Topics Concern  . Not on file   Social History Narrative  . No narrative on file     Current Outpatient Prescriptions:  .  ALPRAZolam (XANAX XR) 1 MG 24 hr tablet, TAKE 1 TABLET BY MOUTH DAILY, Disp: 30 tablet, Rfl: 0 .  Biotin 1 MG CAPS, Take by mouth., Disp: , Rfl:  .  calcitRIOL (ROCALTROL) 0.5 MCG capsule, , Disp: , Rfl:  .  calcium acetate (PHOSLO) 667 MG tablet, Take by mouth., Disp: , Rfl:  .  desonide (DESOWEN)  0.05 % cream, Apply two times daily, Disp: 180 g, Rfl: 0 .  DULoxetine (CYMBALTA) 30 MG capsule, Take 3 capsules (90 mg total) by mouth daily., Disp: 270 capsule, Rfl: 1 .  EPINEPHrine 0.3 mg/0.3 mL IJ SOAJ injection, , Disp: , Rfl:  .  fexofenadine (ALLEGRA) 180 MG tablet, Take by mouth., Disp: , Rfl:  .  hydrOXYzine (ATARAX/VISTARIL) 25 MG tablet, Take 1 tablet (25 mg total) by mouth every 6 (six) hours as needed., Disp: 30 tablet, Rfl: 0 .  Mefenamic Acid 250 MG CAPS, , Disp: , Rfl:  .  omeprazole (PRILOSEC) 20 MG capsule, Take 1 capsule (20 mg total) by mouth daily., Disp: 90 capsule, Rfl: 1 .  SYNTHROID 112 MCG tablet, TAKE 2 TABLETS BY MOUTH  DAILY, Disp: 180 tablet, Rfl: 0  Allergies  Allergen Reactions  . Tape Rash    Uncoded Allergy. Allergen: Msg  . Iodinated Diagnostic Agents Hives    Uncoded Allergy. Allergen: CATS  . Barium-Containing Compounds Hives  . Monosodium Glutamate     Unknown.  . Other Rash    Contrast dye     ROS  Constitutional: Negative for fever or significant  weight change.  Respiratory: Negative for cough and shortness of breath.   Cardiovascular: Negative for chest pain or palpitations.  Gastrointestinal: Negative for abdominal pain, no bowel changes.  Musculoskeletal: Negative for gait problem or joint swelling.  Skin: Negative for rash.  Neurological: Negative for dizziness or headache.  No other specific complaints in a complete review of systems (except as listed in HPI above).  Objective  Vitals:   01/26/16 1139  BP: 108/64  Pulse: (!) 105  Resp: 14  Temp: 98.2 F (36.8 C)  TempSrc: Oral  SpO2: 94%  Weight: 241 lb (109.3 kg)    Body mass index is 38.9 kg/m.  Physical Exam  Constitutional: Patient appears well-developed and well-nourished. Obese  No distress.  HEENT: head atraumatic, normocephalic, pupils equal and reactive to light, neck supple, throat within normal limits, s/p thyroidectomy  Cardiovascular: Normal rate,  regular rhythm and normal heart sounds.  No murmur heard. Trace BLE edema. Pulmonary/Chest: Effort normal and breath sounds normal. No respiratory distress. Abdominal: Soft.  There is no tenderness. Psychiatric: Patient has a normal mood and affect. behavior is normal. Judgment and thought content normal.   Recent Results (from the past 2160 hour(s))  TSH     Status: None   Collection Time: 10/30/15  2:34 PM  Result Value Ref Range   TSH 0.562 0.450 - 4.500 uIU/mL  VITAMIN D 25 Hydroxy (Vit-D Deficiency, Fractures)     Status: None   Collection Time: 10/30/15  2:34 PM  Result Value Ref Range   Vit D, 25-Hydroxy 36.8 30.0 - 100.0 ng/mL  Comment: Vitamin D deficiency has been defined by the [place] practice guideline as a level of serum 25-OH vitamin D less than 20 ng/mL (1,2). The Endocrine Society went on to further define vitamin D insufficiency as a level between 21 and 29 ng/mL (2). 1. IOM (Institute of Medicine). 2010. Dietary reference    intakes for calcium and D. [place]: The    [employer]. 2. Holick MF, Binkley [place], Bischoff-Ferrari HA, et al.    Evaluation, treatment, and prevention of vitamin D    deficiency: an Endocrine Society clinical practice    guideline. JCEM. 2011 Jul; 96(7):1911-30.   Calcium, ionized     Status: None   Collection Time: 10/30/15  2:34 PM  Result Value Ref Range   Calcium, Ion 4.6 4.5 - 5.6 mg/dL  CBC with Differential/Platelet     Status: Abnormal   Collection Time: 10/30/15  2:34 PM  Result Value Ref Range   WBC 14.1 (H) 3.4 - 10.8 x10E3/uL   RBC 5.08 3.77 - 5.28 x10E6/uL   Hemoglobin 12.1 11.1 - 15.9 g/dL   Hematocrit 37.0 34.0 - 46.6 %   MCV 73 (L) 79 - 97 fL   MCH 23.8 (L) 26.6 - 33.0 pg   MCHC 32.7 31.5 - 35.7 g/dL   RDW 17.0 (H) 12.3 - 15.4 %   Platelets 332 150 - 379 x10E3/uL   Neutrophils 72 %   Lymphs 19 %   Monocytes 6 %   Eos 3 %   Basos 0 %   Neutrophils  Absolute 10.1 (H) 1.4 - 7.0 x10E3/uL   Lymphocytes Absolute 2.6 0.7 - 3.1 x10E3/uL   Monocytes Absolute 0.9 0.1 - 0.9 x10E3/uL   EOS (ABSOLUTE) 0.4 0.0 - 0.4 x10E3/uL   Basophils Absolute 0.1 0.0 - 0.2 x10E3/uL   Immature Granulocytes 0 %   Immature Grans (Abs) 0.0 0.0 - 0.1 x10E3/uL      PHQ2/9: Depression screen [facility] 2/9 01/26/2016 10/26/2015 07/28/2015 01/19/2015 10/20/2014  Decreased Interest 0 2 0 0 0  Down, Depressed, Hopeless 1 1 0 1 2  PHQ - 2 Score 1 3 0 1 2  Altered sleeping - 1 - - 0  Tired, decreased energy - 3 - - 1  Change in appetite - 2 - - 0  Feeling bad or failure about yourself  - 1 - - 1  Trouble concentrating - 1 - - 1  Moving slowly or fidgety/restless - 0 - - 0  Suicidal thoughts - 0 - - 0  PHQ-9 Score - 11 - - 5  Difficult doing work/chores - Somewhat difficult - - Not difficult at all     Fall Risk: Fall Risk  01/26/2016 10/26/2015 07/28/2015 04/27/2015 01/19/2015  Falls in the past year? No No No No No     Functional Status Survey: Is the patient deaf or have difficulty hearing?: No Does the patient have difficulty seeing, even when wearing glasses/contacts?: No Does the patient have difficulty concentrating, remembering, or making decisions?: No Does the patient have difficulty walking or climbing stairs?: No Does the patient have difficulty dressing or bathing?: No Does the patient have difficulty doing errands alone such as visiting a doctor's office or shopping?: No    Assessment & Plan  1. Acquired hypothyroidism  - TSH  2. Morbid obesity, unspecified obesity type ([place])  She did not get Belviq filled because of cost. She is on life style modification, we will monitor for now  3. Hodgkin lymphoma, unspecified Hodgkin lymphoma type, unspecified body region ([place])  - CBC with Differential/Platelet  4. GAD (generalized anxiety disorder)  - ALPRAZolam (XANAX XR) 1 MG 24 hr tablet; Take 1 tablet (1 mg total) by mouth daily.  Dispense: 30  tablet; Refill: 2  5. Major depression, recurrent, chronic ([facility])  Still depressed, but does not want to change medication   6. Gastro-esophageal reflux disease without esophagitis  - omeprazole (PRILOSEC) 20 MG capsule; Take 1 capsule (20 mg total) by mouth daily.  Dispense: 90 capsule; Refill: 1  7. Metabolic syndrome  - Hemoglobin A1c  8. Long-term use of high-risk medication  - COMPLETE METABOLIC PANEL WITH GFR

## 2016-01-27 LAB — COMPREHENSIVE METABOLIC PANEL
ALBUMIN: 4.6 g/dL (ref 3.5–5.5)
ALT: 13 IU/L (ref 0–32)
AST: 18 IU/L (ref 0–40)
Albumin/Globulin Ratio: 1.8 (ref 1.2–2.2)
Alkaline Phosphatase: 72 IU/L (ref 39–117)
BUN/Creatinine Ratio: 18 (ref 9–23)
BUN: 14 mg/dL (ref 6–20)
Bilirubin Total: <0.2 mg/dL (ref 0.0–1.2)
CALCIUM: 8.7 mg/dL (ref 8.7–10.2)
CO2: 27 mmol/L (ref 18–29)
CREATININE: 0.76 mg/dL (ref 0.57–1.00)
Chloride: 95 mmol/L — ABNORMAL LOW (ref 96–106)
GFR, EST AFRICAN AMERICAN: 119 mL/min/{1.73_m2} (ref >59)
GFR, EST NON AFRICAN AMERICAN: 103 mL/min/{1.73_m2} (ref >59)
Globulin, Total: 2.5 g/dL (ref 1.5–4.5)
Glucose: 80 mg/dL (ref 65–99)
Potassium: 4.6 mmol/L (ref 3.5–5.2)
SODIUM: 140 mmol/L (ref 134–144)
TOTAL PROTEIN: 7.1 g/dL (ref 6.0–8.5)

## 2016-01-27 LAB — CBC WITH DIFFERENTIAL/PLATELET
BASOS: 1 %
Basophils Absolute: 0.1 10*3/uL (ref 0.0–0.2)
EOS (ABSOLUTE): 0.5 10*3/uL — ABNORMAL HIGH (ref 0.0–0.4)
EOS: 5 %
HEMATOCRIT: 36.7 % (ref 34.0–46.6)
HEMOGLOBIN: 11.9 g/dL (ref 11.1–15.9)
IMMATURE GRANULOCYTES: 0 %
Immature Grans (Abs): 0 10*3/uL (ref 0.0–0.1)
LYMPHS ABS: 3 10*3/uL (ref 0.7–3.1)
Lymphs: 28 %
MCH: 23.2 pg — ABNORMAL LOW (ref 26.6–33.0)
MCHC: 32.4 g/dL (ref 31.5–35.7)
MCV: 71 fL — AB (ref 79–97)
MONOCYTES: 8 %
Monocytes Absolute: 0.8 10*3/uL (ref 0.1–0.9)
NEUTROS PCT: 58 %
Neutrophils Absolute: 6.4 10*3/uL (ref 1.4–7.0)
Platelets: 375 10*3/uL (ref 150–379)
RBC: 5.14 x10E6/uL (ref 3.77–5.28)
RDW: 17 % — ABNORMAL HIGH (ref 12.3–15.4)
WBC: 10.8 10*3/uL (ref 3.4–10.8)

## 2016-01-27 LAB — HEMOGLOBIN A1C
Est. average glucose Bld gHb Est-mCnc: 126 mg/dL
Hgb A1c MFr Bld: 6 % — ABNORMAL HIGH (ref 4.8–5.6)

## 2016-01-27 LAB — TSH: TSH: 4.88 u[IU]/mL — ABNORMAL HIGH (ref 0.450–4.500)

## 2016-01-30 ENCOUNTER — Other Ambulatory Visit: Payer: Self-pay | Admitting: Family Medicine

## 2016-01-30 NOTE — Telephone Encounter (Signed)
Patient requesting refill of Alprazolam to [facility].  

## 2016-01-31 ENCOUNTER — Other Ambulatory Visit: Payer: Self-pay | Admitting: Family Medicine

## 2016-01-31 MED ORDER — ALPRAZOLAM 0.5 MG PO TABS: 0.5000 mg | ORAL_TABLET | Freq: Every evening | ORAL | 0 refills | Status: DC | PRN

## 2016-01-31 NOTE — Telephone Encounter (Signed)
Patient informed and prescription called into [facility] in [place].

## 2016-01-31 NOTE — Telephone Encounter (Signed)
I will sent 30 days to last at least 6 months. Printed please call it in

## 2016-01-31 NOTE — Telephone Encounter (Signed)
Patient states with the holidays her panic attacks have started since experiencing family issues and wanted to get thru the holidays. Patient states she has had so much going on and with work, but her last prescription wasn't filled since June 2017 but now has been running low. Since she will be seeing them more with the holidays she needs one more refill.

## 2016-03-15 ENCOUNTER — Other Ambulatory Visit: Payer: Self-pay | Admitting: *Deleted

## 2016-03-15 DIAGNOSIS — C8190 Hodgkin lymphoma, unspecified, unspecified site: Secondary | ICD-10-CM

## 2016-03-17 NOTE — Progress Notes (Signed)
[place]  Telephone:([phone]) [phone] Fax:(336) [phone]  ID: Jo [name] OB: 26-Nov-1980  MR#: [id number]  [id number]  Patient Care Team: Steele Sizer, MD as PCP - General (Family Medicine) Seeplaputhur Robinette Haines, MD (General Surgery) [place] Gleason, MD (Unknown Physician Specialty) Steele Sizer, MD as Attending Physician (Family Medicine)  CHIEF COMPLAINT: Hodgkin's lymphoma  INTERVAL HISTORY: Patient returns to clinic today for repeat laboratory and further evaluation. She continues to feel well and remains asymptomatic. She denies any fevers, weight loss, or night sweats. She has no neurologic complaints. She denies any weakness or fatigue. She has no chest pain or shortness of breath. She denies any nausea, vomiting, constipation, or diarrhea. She has no urinary complaints. Patient feels at her baseline and offers no specific complaints today.  REVIEW OF SYSTEMS:   Review of Systems  Constitutional: Negative.  Negative for diaphoresis, fever, malaise/fatigue and weight loss.  HENT: Negative.   Respiratory: Negative.  Negative for cough and shortness of breath.   Cardiovascular: Negative.  Negative for chest pain.  Gastrointestinal: Negative.  Negative for abdominal pain.  Genitourinary: Negative.   Musculoskeletal: Negative.   Neurological: Negative.  Negative for weakness.  Psychiatric/Behavioral: Negative.  The patient is not nervous/anxious.     As per HPI. Otherwise, a complete review of systems is negative.  PAST MEDICAL HISTORY: Past Medical History:  Diagnosis Date  . Anxiety   . Constipation   . Cramp of limb   . Depression   . GERD (gastroesophageal reflux disease)   . Hives   . Hodgkin disease ([place]) 2001 and 2014   chemo and rad tx in 2001 and 2014  . Hodgkin's disease in remission ([place])   . Hyperlipidemia   . Insomnia   . Lymphoma, Hodgkin's ([place])    2001, 2014  . Migraine   . Obesity   . Oligomenorrhea   . Primary female  infertility   . Screening mammogram for high-risk patient   . Seborrhea   . Snoring   . Thyroid disease   . TMJ (dislocation of temporomandibular joint)   . Vitamin D deficiency     PAST SURGICAL HISTORY: Past Surgical History:  Procedure Laterality Date  . lymphonodi biopsy    . PORT-A-CATH REMOVAL  2004  . PORTACATH PLACEMENT  11-24-12  . THYROID SURGERY    . THYROIDECTOMY  10-13-12  . uterine poly removal Left 08/2010   [facility]    FAMILY HISTORY: Family History  Problem Relation Age of Onset  . Hypertension Mother   . Diabetes Father   . Heart disease Father   . CAD Father   . Diabetes Brother   . Breast cancer Paternal Aunt     great aunt >50  . Breast cancer Cousin   . CAD Maternal Grandmother   . Parkinson's disease Maternal Grandmother   . Parkinson's disease Maternal Grandfather   . CAD Paternal Grandmother        ADVANCED DIRECTIVES:    HEALTH MAINTENANCE: Social History  Substance Use Topics  . Smoking status: Former Smoker    Packs/day: 1.00    Years: 0.00    Types: Cigarettes    Quit date: 07/27/2009  . Smokeless tobacco: Never Used  . Alcohol use 0.0 oz/week     Comment: rarely     Colonoscopy:  PAP:  Bone density:  Lipid panel:  Allergies  Allergen Reactions  . Tape Rash    Uncoded Allergy. Allergen: Msg  . Barium-Containing Compounds Hives  Oral contrast  . Monosodium Glutamate     Unknown.    Current Outpatient Prescriptions  Medication Sig Dispense Refill  . ALPRAZolam (XANAX XR) 1 MG 24 hr tablet Take 1 tablet (1 mg total) by mouth daily. 30 tablet 2  . ALPRAZolam (XANAX) 0.5 MG tablet Take 1 tablet (0.5 mg total) by mouth at bedtime as needed for anxiety. 30 tablet 0  . Biotin 1 MG CAPS Take by mouth.    . calcitRIOL (ROCALTROL) 0.5 MCG capsule     . calcium acetate (PHOSLO) 667 MG tablet Take by mouth.    . desonide (DESOWEN) 0.05 % cream Apply two times daily 180 g 0  . DULoxetine (CYMBALTA) 30 MG capsule Take 3 capsules  (90 mg total) by mouth daily. 270 capsule 1  . EPINEPHrine 0.3 mg/0.3 mL IJ SOAJ injection     . fexofenadine (ALLEGRA) 180 MG tablet Take by mouth.    . hydrOXYzine (ATARAX/VISTARIL) 25 MG tablet Take 1 tablet (25 mg total) by mouth every 6 (six) hours as needed. 30 tablet 0  . omeprazole (PRILOSEC) 20 MG capsule Take 1 capsule (20 mg total) by mouth daily. 90 capsule 1  . SYNTHROID 112 MCG tablet TAKE 2 TABLETS BY MOUTH  DAILY 180 tablet 0  . Mefenamic Acid 250 MG CAPS      No current facility-administered medications for this visit.     OBJECTIVE: Vitals:   03/18/16 1617  BP: (!) 153/92  Pulse: 99  Temp: 98.8 F (37.1 C)     Body mass index is 39.41 kg/m.    ECOG FS:0 - Asymptomatic  General: Well-developed, well-nourished, no acute distress. Eyes: Pink conjunctiva, anicteric sclera. HEENT: Normocephalic, moist mucous membranes, clear oropharnyx. Lungs: Clear to auscultation bilaterally. Heart: Regular rate and rhythm. No rubs, murmurs, or gallops. Abdomen: Soft, nontender, nondistended. No organomegaly noted, normoactive bowel sounds. Musculoskeletal: No edema, cyanosis, or clubbing. Neuro: Alert, answering all questions appropriately. Cranial nerves grossly intact. Skin: No rashes or petechiae noted. Psych: Normal affect. Lymphatics: No cervical, calvicular, axillary or inguinal LAD.   LAB RESULTS:  Lab Results  Component Value Date   NA 136 03/18/2016   K 3.5 03/18/2016   CL 99 (L) 03/18/2016   CO2 28 03/18/2016   GLUCOSE 91 03/18/2016   BUN 11 03/18/2016   CREATININE 0.54 03/18/2016   CALCIUM 8.3 (L) 03/18/2016   PROT 7.2 03/18/2016   ALBUMIN 4.2 03/18/2016   AST 20 03/18/2016   ALT 16 03/18/2016   ALKPHOS 49 03/18/2016   BILITOT 0.5 03/18/2016   GFRNONAA >60 03/18/2016   GFRAA >60 03/18/2016    Lab Results  Component Value Date   WBC 10.5 03/18/2016   NEUTROABS 6.3 03/18/2016   HGB 11.5 (L) 03/18/2016   HCT 34.0 (L) 03/18/2016   MCV 71.5 (L)  03/18/2016   PLT 295 03/18/2016     STUDIES: No results found.  ASSESSMENT: Recurrent stage IV Hodgkin's lymphoma, in remission.  PLAN:    1. Hodgkin's lymphoma, by report patient had stage IV disease. She was initially diagnosed in 2001 and received MOPP chemotherapy. Patient remained in remission until she had a local recurrence in 2014 in her neck confirmed by lymph node excision. She received 2 cycles of ABVD and then XRT for local control. Her most recent PET scan on September 01, 2015 reviewed independently with no evidence of recurrence. Patient is now 3 years out from her recurrence, and can be switched to yearly imaging with CT scans.  Return to clinic in July 2018 for repeat imaging and further evaluation. 2. Anemia: Mild, monitor.  Patient expressed understanding and was in agreement with this plan. She also understands that She can call clinic at any time with any questions, concerns, or complaints.    Lloyd Huger, MD   03/21/2016 7:23 PM

## 2016-03-18 ENCOUNTER — Inpatient Hospital Stay (HOSPITAL_BASED_OUTPATIENT_CLINIC_OR_DEPARTMENT_OTHER): Payer: 59 | Admitting: Oncology

## 2016-03-18 ENCOUNTER — Inpatient Hospital Stay: Payer: 59 | Attending: Oncology

## 2016-03-18 VITALS — BP 153/92 | HR 99 | Temp 98.8°F | Wt 244.2 lb

## 2016-03-18 DIAGNOSIS — D649 Anemia, unspecified: Secondary | ICD-10-CM

## 2016-03-18 DIAGNOSIS — E785 Hyperlipidemia, unspecified: Secondary | ICD-10-CM

## 2016-03-18 DIAGNOSIS — Z8572 Personal history of non-Hodgkin lymphomas: Secondary | ICD-10-CM | POA: Insufficient documentation

## 2016-03-18 DIAGNOSIS — E079 Disorder of thyroid, unspecified: Secondary | ICD-10-CM

## 2016-03-18 DIAGNOSIS — K219 Gastro-esophageal reflux disease without esophagitis: Secondary | ICD-10-CM

## 2016-03-18 DIAGNOSIS — C8198 Hodgkin lymphoma, unspecified, lymph nodes of multiple sites: Secondary | ICD-10-CM

## 2016-03-18 DIAGNOSIS — Z803 Family history of malignant neoplasm of breast: Secondary | ICD-10-CM | POA: Diagnosis not present

## 2016-03-18 DIAGNOSIS — Z87891 Personal history of nicotine dependence: Secondary | ICD-10-CM | POA: Diagnosis not present

## 2016-03-18 DIAGNOSIS — E559 Vitamin D deficiency, unspecified: Secondary | ICD-10-CM | POA: Diagnosis not present

## 2016-03-18 DIAGNOSIS — Z79899 Other long term (current) drug therapy: Secondary | ICD-10-CM | POA: Diagnosis not present

## 2016-03-18 DIAGNOSIS — C8190 Hodgkin lymphoma, unspecified, unspecified site: Secondary | ICD-10-CM

## 2016-03-18 LAB — COMPREHENSIVE METABOLIC PANEL
ALK PHOS: 49 U/L (ref 38–126)
ALT: 16 U/L (ref 14–54)
AST: 20 U/L (ref 15–41)
Albumin: 4.2 g/dL (ref 3.5–5.0)
Anion gap: 9 (ref 5–15)
BUN: 11 mg/dL (ref 6–20)
CHLORIDE: 99 mmol/L — AB (ref 101–111)
CO2: 28 mmol/L (ref 22–32)
CREATININE: 0.54 mg/dL (ref 0.44–1.00)
Calcium: 8.3 mg/dL — ABNORMAL LOW (ref 8.9–10.3)
GFR calc Af Amer: >60 mL/min (ref >60)
GFR calc non Af Amer: >60 mL/min (ref >60)
GLUCOSE: 91 mg/dL (ref 65–99)
Potassium: 3.5 mmol/L (ref 3.5–5.1)
SODIUM: 136 mmol/L (ref 135–145)
Total Bilirubin: 0.5 mg/dL (ref 0.3–1.2)
Total Protein: 7.2 g/dL (ref 6.5–8.1)

## 2016-03-18 LAB — LACTATE DEHYDROGENASE: LDH: 127 U/L (ref 98–192)

## 2016-03-18 LAB — CBC WITH DIFFERENTIAL/PLATELET
BASOS ABS: 0.1 10*3/uL (ref 0–0.1)
Basophils Relative: 1 %
EOS ABS: 0.5 10*3/uL (ref 0–0.7)
EOS PCT: 5 %
HCT: 34 % — ABNORMAL LOW (ref 35.0–47.0)
HEMOGLOBIN: 11.5 g/dL — AB (ref 12.0–16.0)
LYMPHS PCT: 27 %
Lymphs Abs: 2.9 10*3/uL (ref 1.0–3.6)
MCH: 24.1 pg — ABNORMAL LOW (ref 26.0–34.0)
MCHC: 33.7 g/dL (ref 32.0–36.0)
MCV: 71.5 fL — ABNORMAL LOW (ref 80.0–100.0)
Monocytes Absolute: 0.8 10*3/uL (ref 0.2–0.9)
Monocytes Relative: 7 %
NEUTROS PCT: 60 %
Neutro Abs: 6.3 10*3/uL (ref 1.4–6.5)
PLATELETS: 295 10*3/uL (ref 150–440)
RBC: 4.75 MIL/uL (ref 3.80–5.20)
RDW: 17.3 % — ABNORMAL HIGH (ref 11.5–14.5)
WBC: 10.5 10*3/uL (ref 3.6–11.0)

## 2016-03-18 LAB — SEDIMENTATION RATE: Sed Rate: 54 mm/hr — ABNORMAL HIGH (ref 0–20)

## 2016-03-19 ENCOUNTER — Telehealth: Payer: Self-pay | Admitting: *Deleted

## 2016-03-19 NOTE — Telephone Encounter (Signed)
Spoke with patient to clarify allergies listed. Pt states that is allergic to oral contrast only but can receive IV contrast without problems. Allergy list has been updated.

## 2016-04-01 ENCOUNTER — Other Ambulatory Visit: Payer: Self-pay | Admitting: Family Medicine

## 2016-04-01 DIAGNOSIS — K219 Gastro-esophageal reflux disease without esophagitis: Secondary | ICD-10-CM

## 2016-04-01 DIAGNOSIS — F339 Major depressive disorder, recurrent, unspecified: Secondary | ICD-10-CM

## 2016-04-01 DIAGNOSIS — F411 Generalized anxiety disorder: Secondary | ICD-10-CM

## 2016-04-19 ENCOUNTER — Ambulatory Visit (INDEPENDENT_AMBULATORY_CARE_PROVIDER_SITE_OTHER): Payer: 59 | Admitting: Family Medicine

## 2016-04-19 ENCOUNTER — Other Ambulatory Visit: Payer: Self-pay

## 2016-04-19 ENCOUNTER — Encounter: Payer: Self-pay | Admitting: Family Medicine

## 2016-04-19 VITALS — BP 110/68 | HR 96 | Temp 97.6°F | Resp 18 | Ht 66.0 in | Wt 236.7 lb

## 2016-04-19 DIAGNOSIS — E890 Postprocedural hypothyroidism: Secondary | ICD-10-CM

## 2016-04-19 DIAGNOSIS — C8190 Hodgkin lymphoma, unspecified, unspecified site: Secondary | ICD-10-CM

## 2016-04-19 DIAGNOSIS — K219 Gastro-esophageal reflux disease without esophagitis: Secondary | ICD-10-CM

## 2016-04-19 DIAGNOSIS — E209 Hypoparathyroidism, unspecified: Secondary | ICD-10-CM | POA: Diagnosis not present

## 2016-04-19 DIAGNOSIS — E892 Postprocedural hypoparathyroidism: Secondary | ICD-10-CM

## 2016-04-19 DIAGNOSIS — F339 Major depressive disorder, recurrent, unspecified: Secondary | ICD-10-CM | POA: Diagnosis not present

## 2016-04-19 DIAGNOSIS — E8881 Metabolic syndrome: Secondary | ICD-10-CM | POA: Diagnosis not present

## 2016-04-19 DIAGNOSIS — F411 Generalized anxiety disorder: Secondary | ICD-10-CM

## 2016-04-19 DIAGNOSIS — E559 Vitamin D deficiency, unspecified: Secondary | ICD-10-CM

## 2016-04-19 DIAGNOSIS — E039 Hypothyroidism, unspecified: Secondary | ICD-10-CM | POA: Diagnosis not present

## 2016-04-19 MED ORDER — OMEPRAZOLE 20 MG PO CPDR: 20.0000 mg | DELAYED_RELEASE_CAPSULE | Freq: Every day | ORAL | 1 refills | Status: DC

## 2016-04-19 MED ORDER — ALPRAZOLAM ER 1 MG PO TB24: 1.0000 mg | ORAL_TABLET | Freq: Every day | ORAL | 2 refills | Status: DC

## 2016-04-19 NOTE — Telephone Encounter (Signed)
Patient requesting refill of Prilosec for a 90 day supply.

## 2016-04-19 NOTE — Progress Notes (Signed)
Name: Jo [name]   MRN: [id number]    DOB: 1980-09-03   Date:04/19/2016       Progress Note  Subjective  Chief Complaint  Chief Complaint  Patient presents with  . Medication Refill    3 month F/U  . Hypothyroidism    Hair is falling out more than usually  . Depression    Still seeing therapist weekly and stable  . Gastroesophageal Reflux    Needs refills, well controlled with daily use    HPI  Major Depression recurrent: she is still getting therapy with Oretha Ellis  once a week, taking Cymbalta, they are almost debt free - doing the Luvenia Heller plan - it has helped her marriage - less stressed about that. She is going to see Dr. Star Age and see a reproductive endocrinologist before she decides to adopt. No suicidal thoughts or ideation  GAD: she takes Alprazolam XR daily and prn xanax immediate release. Still worries all the time, has problems sleeping, she has a short temper at times. Stable with Cymbalta and Xanax XR, and has 12 pills of Alprazolam from December. She is trying not to take it all the time.   GERD: under control with medication, no heartburn or regurgitation.   Hypothyroidism: she used to see  Dr. Gabriel Carina but now monitored here.  She is currently on Synthroid 26mg, s/p thyroidectomy TSH was at goal , however she has noticed hair loss over the past few weeks, worsening of dry skin. We will recheck level   Hypocalcemia secondary to radiation to neck: she is on calcium supplementation, she has increased calcium in her diet, we will recheck levels and also PTH - since it was low after thyroidectomy.   Hodgkins lymphoma: she sees Dr. FGrayland Ormondnow, last relapse was in 2013, last WBC back to normal,  she wants to recheck levels today.   Morbid obesity: she responded to Qsymia but can't afford it, we gave ger Belviq but she never filled prescription because of the cost, she is following a healthy diet, stopped drinking mountain dew - her co-worker is a  nEngineer, maintenance (IT)and has been helping her out. Following a diet plan.   Metabolic Syndrome: she has a history of hyperglycemia, no polyphagia, polydipsia or polyuria.   Patient Active Problem List   Diagnosis Date Noted  . Tension type headache 04/27/2015  . Hypocalcemia 04/17/2015  . GAD (generalized anxiety disorder) 10/18/2014  . RLS (restless legs syndrome) 10/18/2014  . Insomnia, persistent 10/18/2014  . Migraine without aura and responsive to treatment 10/18/2014  . CN (constipation) 10/18/2014  . Major depression, recurrent, chronic ([place] 10/18/2014  . Dysmenorrhea 10/18/2014  . Female infertility 10/18/2014  . Gastro-esophageal reflux disease without esophagitis 10/18/2014  . Allergy, food 10/18/2014  . Hodgkin lymphoma, unspecified, unspecified site ([facility] 10/18/2014  . H/O thyroidectomy 10/18/2014  . Metabolic syndrome 042/68/3419 . Extreme obesity ([facility] 10/18/2014  . Low parathyroid level after removal ([place] 10/18/2014  . Vitamin D deficiency 10/18/2014  . Hive 10/18/2014  . Dermatitis seborrheica 10/18/2014  . Arthralgia of temporomandibular joint 10/18/2014  . Post-surgical hypothyroidism 10/13/2012    Past Surgical History:  Procedure Laterality Date  . lymphonodi biopsy    . PORT-A-CATH REMOVAL  2004  . PORTACATH PLACEMENT  11-24-12  . THYROID SURGERY    . THYROIDECTOMY  10-13-12  . uterine poly removal Left 08/2010   [facility]    Family History  Problem Relation Age of Onset  . Hypertension Mother   .  Diabetes Father   . Heart disease Father   . CAD Father   . Diabetes Brother   . Breast cancer Paternal Aunt     great aunt >50  . Breast cancer Cousin   . CAD Maternal Grandmother   . Parkinson's disease Maternal Grandmother   . Parkinson's disease Maternal Grandfather   . CAD Paternal Grandmother     Social History   Social History  . Marital status: Married    Spouse name: N/A  . Number of children: N/A  . Years of education: N/A   Occupational  History  . Not on file.   Social History Main Topics  . Smoking status: Former Smoker    Packs/day: 1.00    Years: 0.00    Types: Cigarettes    Quit date: 07/27/2009  . Smokeless tobacco: Never Used  . Alcohol use 0.0 oz/week     Comment: rarely  . Drug use: No  . Sexual activity: Yes   Other Topics Concern  . Not on file   Social History Narrative  . No narrative on file     Current Outpatient Prescriptions:  .  ALPRAZolam (XANAX XR) 1 MG 24 hr tablet, Take 1 tablet (1 mg total) by mouth daily., Disp: 30 tablet, Rfl: 2 .  ALPRAZolam (XANAX) 0.5 MG tablet, Take 1 tablet (0.5 mg total) by mouth at bedtime as needed for anxiety., Disp: 30 tablet, Rfl: 0 .  Biotin 1 MG CAPS, Take by mouth., Disp: , Rfl:  .  calcitRIOL (ROCALTROL) 0.5 MCG capsule, , Disp: , Rfl:  .  calcium acetate (PHOSLO) 667 MG tablet, Take by mouth., Disp: , Rfl:  .  desonide (DESOWEN) 0.05 % cream, Apply two times daily, Disp: 180 g, Rfl: 0 .  DULoxetine (CYMBALTA) 30 MG capsule, TAKE 3 CAPSULES BY MOUTH  DAILY, Disp: 270 capsule, Rfl: 1 .  EPINEPHrine 0.3 mg/0.3 mL IJ SOAJ injection, , Disp: , Rfl:  .  fexofenadine (ALLEGRA) 180 MG tablet, Take by mouth., Disp: , Rfl:  .  hydrOXYzine (ATARAX/VISTARIL) 25 MG tablet, Take 1 tablet (25 mg total) by mouth every 6 (six) hours as needed., Disp: 30 tablet, Rfl: 0 .  Mefenamic Acid 250 MG CAPS, , Disp: , Rfl:  .  omeprazole (PRILOSEC) 20 MG capsule, Take 1 capsule (20 mg total) by mouth daily., Disp: 90 capsule, Rfl: 1 .  SYNTHROID 112 MCG tablet, TAKE 2 TABLETS BY MOUTH  DAILY, Disp: 180 tablet, Rfl: 0  Allergies  Allergen Reactions  . Tape Rash    Uncoded Allergy. Allergen: Msg  . Barium-Containing Compounds Hives    Oral contrast  . Monosodium Glutamate     Unknown.     ROS  Constitutional: Negative for fever or weight change.  Respiratory: Negative for cough and shortness of breath.   Cardiovascular: Negative for chest pain or palpitations.   Gastrointestinal: Negative for abdominal pain, no bowel changes.  Musculoskeletal: Negative for gait problem or joint swelling.  Skin: Negative for rash.  Neurological: Negative for dizziness or headache.  No other specific complaints in a complete review of systems (except as listed in HPI above).  Objective  Vitals:   04/19/16 0904  BP: 110/68  Pulse: 96  Resp: 18  Temp: 97.6 F (36.4 C)  TempSrc: Oral  SpO2: 95%  Weight: 236 lb 11.2 oz (107.4 kg)  Height: _0  (1.676 m)    Body mass index is 38.2 kg/m.  Physical Exam  Constitutional: Patient appears well-developed  and well-nourished. Obese  No distress.  HEENT: head atraumatic, normocephalic, pupils equal and reactive to light,  neck supple, throat within normal limits, s/p thyroidectomy Cardiovascular: Normal rate, regular rhythm and normal heart sounds.  No murmur heard. No BLE edema. Pulmonary/Chest: Effort normal and breath sounds normal. No respiratory distress. Abdominal: Soft.  There is no tenderness. Psychiatric: Patient has a normal mood and affect. behavior is normal. Judgment and thought content normal.  Recent Results (from the past 2160 hour(s))  Hemoglobin A1c     Status: Abnormal   Collection Time: 01/26/16 12:50 PM  Result Value Ref Range   Hgb A1c MFr Bld 6.0 (H) 4.8 - 5.6 %    Comment:          Pre-diabetes: 5.7 - 6.4          Diabetes: >6.4          Glycemic control for adults with diabetes: <7.0    Est. average glucose Bld gHb Est-mCnc 126 mg/dL  CBC with Differential/Platelet     Status: Abnormal   Collection Time: 01/26/16 12:50 PM  Result Value Ref Range   WBC 10.8 3.4 - 10.8 x10E3/uL   RBC 5.14 3.77 - 5.28 x10E6/uL   Hemoglobin 11.9 11.1 - 15.9 g/dL   Hematocrit 36.7 34.0 - 46.6 %   MCV 71 (L) 79 - 97 fL   MCH 23.2 (L) 26.6 - 33.0 pg   MCHC 32.4 31.5 - 35.7 g/dL   RDW 17.0 (H) 12.3 - 15.4 %   Platelets 375 150 - 379 x10E3/uL   Neutrophils 58 Not Estab. %   Lymphs 28 Not Estab. %    Monocytes 8 Not Estab. %   Eos 5 Not Estab. %   Basos 1 Not Estab. %   Neutrophils Absolute 6.4 1.4 - 7.0 x10E3/uL   Lymphocytes Absolute 3.0 0.7 - 3.1 x10E3/uL   Monocytes Absolute 0.8 0.1 - 0.9 x10E3/uL   EOS (ABSOLUTE) 0.5 (H) 0.0 - 0.4 x10E3/uL   Basophils Absolute 0.1 0.0 - 0.2 x10E3/uL   Immature Granulocytes 0 Not Estab. %   Immature Grans (Abs) 0.0 0.0 - 0.1 x10E3/uL  TSH     Status: Abnormal   Collection Time: 01/26/16 12:50 PM  Result Value Ref Range   TSH 4.880 (H) 0.450 - 4.500 uIU/mL  Comprehensive metabolic panel     Status: Abnormal   Collection Time: 01/26/16 12:50 PM  Result Value Ref Range   Glucose 80 65 - 99 mg/dL   BUN 14 6 - 20 mg/dL   Creatinine, Ser 0.76 0.57 - 1.00 mg/dL   GFR calc non Af Amer 103 >59 mL/min/1.73   GFR calc Af Amer 119 >59 mL/min/1.73   BUN/Creatinine Ratio 18 9 - 23   Sodium 140 134 - 144 mmol/L   Potassium 4.6 3.5 - 5.2 mmol/L   Chloride 95 (L) 96 - 106 mmol/L   CO2 27 18 - 29 mmol/L   Calcium 8.7 8.7 - 10.2 mg/dL   Total Protein 7.1 6.0 - 8.5 g/dL   Albumin 4.6 3.5 - 5.5 g/dL   Globulin, Total 2.5 1.5 - 4.5 g/dL   Albumin/Globulin Ratio 1.8 1.2 - 2.2   Bilirubin Total <0.2 0.0 - 1.2 mg/dL   Alkaline Phosphatase 72 39 - 117 IU/L   AST 18 0 - 40 IU/L   ALT 13 0 - 32 IU/L  CBC with Differential     Status: Abnormal   Collection Time: 03/18/16  3:50 PM  Result  Value Ref Range   WBC 10.5 3.6 - 11.0 K/uL   RBC 4.75 3.80 - 5.20 MIL/uL   Hemoglobin 11.5 (L) 12.0 - 16.0 g/dL   HCT 34.0 (L) 35.0 - 47.0 %   MCV 71.5 (L) 80.0 - 100.0 fL   MCH 24.1 (L) 26.0 - 34.0 pg   MCHC 33.7 32.0 - 36.0 g/dL   RDW 17.3 (H) 11.5 - 14.5 %   Platelets 295 150 - 440 K/uL   Neutrophils Relative % 60 %   Neutro Abs 6.3 1.4 - 6.5 K/uL   Lymphocytes Relative 27 %   Lymphs Abs 2.9 1.0 - 3.6 K/uL   Monocytes Relative 7 %   Monocytes Absolute 0.8 0.2 - 0.9 K/uL   Eosinophils Relative 5 %   Eosinophils Absolute 0.5 0 - 0.7 K/uL   Basophils Relative 1 %    Basophils Absolute 0.1 0 - 0.1 K/uL  Comprehensive metabolic panel     Status: Abnormal   Collection Time: 03/18/16  3:50 PM  Result Value Ref Range   Sodium 136 135 - 145 mmol/L   Potassium 3.5 3.5 - 5.1 mmol/L   Chloride 99 (L) 101 - 111 mmol/L   CO2 28 22 - 32 mmol/L   Glucose, Bld 91 65 - 99 mg/dL   BUN 11 6 - 20 mg/dL   Creatinine, Ser 0.54 0.44 - 1.00 mg/dL   Calcium 8.3 (L) 8.9 - 10.3 mg/dL   Total Protein 7.2 6.5 - 8.1 g/dL   Albumin 4.2 3.5 - 5.0 g/dL   AST 20 15 - 41 U/L   ALT 16 14 - 54 U/L   Alkaline Phosphatase 49 38 - 126 U/L   Total Bilirubin 0.5 0.3 - 1.2 mg/dL   GFR calc non Af Amer >60 >60 mL/min   GFR calc Af Amer >60 >60 mL/min    Comment: (NOTE) The eGFR has been calculated using the CKD EPI equation. This calculation has not been validated in all clinical situations. eGFR's persistently <60 mL/min signify possible Chronic Kidney Disease.    Anion gap 9 5 - 15  Lactate dehydrogenase     Status: None   Collection Time: 03/18/16  3:50 PM  Result Value Ref Range   LDH 127 98 - 192 U/L  Sedimentation rate     Status: Abnormal   Collection Time: 03/18/16  3:50 PM  Result Value Ref Range   Sed Rate 54 (H) 0 - 20 mm/hr     PHQ2/9: Depression screen [facility] 2/9 04/19/2016 01/26/2016 10/26/2015 07/28/2015 01/19/2015  Decreased Interest 1 0 2 0 0  Down, Depressed, Hopeless 0 1 1 0 1  PHQ - 2 Score _0 0 1  Altered sleeping - - 1 - -  Tired, decreased energy - - 3 - -  Change in appetite - - 2 - -  Feeling bad or failure about yourself  - - 1 - -  Trouble concentrating - - 1 - -  Moving slowly or fidgety/restless - - 0 - -  Suicidal thoughts - - 0 - -  PHQ-9 Score - - 11 - -  Difficult doing work/chores - - Somewhat difficult - -      Fall Risk: Fall Risk  04/19/2016 01/26/2016 10/26/2015 07/28/2015 04/27/2015  Falls in the past year? _1      Functional Status Survey: Is the patient deaf or have difficulty hearing?: No Does the patient have  difficulty seeing, even when wearing glasses/contacts?:  No Does the patient have difficulty concentrating, remembering, or making decisions?: No Does the patient have difficulty walking or climbing stairs?: No Does the patient have difficulty dressing or bathing?: No Does the patient have difficulty doing errands alone such as visiting a doctor's office or shopping?: No   Assessment & Plan  1. Acquired hypothyroidism  - Lipid panel - TSH  2. Morbid obesity, unspecified obesity type ([place])  She is seeing dietician ( a friend ) and is doing meal planning and exercising and is doing well, lost 5 lbs since last visit  - Hemoglobin A1c - Insulin, 2 Hour  3. Hodgkin lymphoma, unspecified Hodgkin lymphoma type, unspecified body region ([place])  - CBC With Differential  4. GAD (generalized anxiety disorder)  - ALPRAZolam (XANAX XR) 1 MG 24 hr tablet; Take 1 tablet (1 mg total) by mouth daily.  Dispense: 30 tablet; Refill: 2  5. Major depression, recurrent, chronic ([place])  - Comp. Metabolic Panel (12)  6. Gastro-esophageal reflux disease without esophagitis  Under control   7. Metabolic syndrome  - Comp. Metabolic Panel (12)  8. Vitamin D deficiency  - VITAMIN D 25 Hydroxy (Vit-D Deficiency, Fractures)  9. Low parathyroid level after removal ([facility])  - Parathyroid hormone, intact (no Ca)

## 2016-04-20 LAB — LIPID PANEL
CHOL/HDL RATIO: 3.3 ratio (ref 0.0–4.4)
Cholesterol, Total: 170 mg/dL (ref 100–199)
HDL: 51 mg/dL (ref >39)
LDL CALC: 96 mg/dL (ref 0–99)
TRIGLYCERIDES: 113 mg/dL (ref 0–149)
VLDL Cholesterol Cal: 23 mg/dL (ref 5–40)

## 2016-04-20 LAB — CBC WITH DIFFERENTIAL
BASOS ABS: 0.1 10*3/uL (ref 0.0–0.2)
Basos: 1 %
EOS (ABSOLUTE): 0.5 10*3/uL — ABNORMAL HIGH (ref 0.0–0.4)
EOS: 4 %
HEMATOCRIT: 37.9 % (ref 34.0–46.6)
HEMOGLOBIN: 12.3 g/dL (ref 11.1–15.9)
IMMATURE GRANULOCYTES: 0 %
Immature Grans (Abs): 0 10*3/uL (ref 0.0–0.1)
LYMPHS ABS: 2.8 10*3/uL (ref 0.7–3.1)
Lymphs: 26 %
MCH: 23.7 pg — ABNORMAL LOW (ref 26.6–33.0)
MCHC: 32.5 g/dL (ref 31.5–35.7)
MCV: 73 fL — ABNORMAL LOW (ref 79–97)
MONOCYTES: 7 %
MONOS ABS: 0.8 10*3/uL (ref 0.1–0.9)
Neutrophils Absolute: 6.9 10*3/uL (ref 1.4–7.0)
Neutrophils: 62 %
RBC: 5.19 x10E6/uL (ref 3.77–5.28)
RDW: 16.9 % — AB (ref 12.3–15.4)
WBC: 11 10*3/uL — AB (ref 3.4–10.8)

## 2016-04-20 LAB — TSH: TSH: 4.6 u[IU]/mL — ABNORMAL HIGH (ref 0.450–4.500)

## 2016-04-20 LAB — COMP. METABOLIC PANEL (12)
A/G RATIO: 1.9 (ref 1.2–2.2)
ALK PHOS: 53 IU/L (ref 39–117)
AST: 18 IU/L (ref 0–40)
Albumin: 4.5 g/dL (ref 3.5–5.5)
BUN/Creatinine Ratio: 27 — ABNORMAL HIGH (ref 9–23)
BUN: 20 mg/dL (ref 6–20)
Bilirubin Total: <0.2 mg/dL (ref 0.0–1.2)
CHLORIDE: 97 mmol/L (ref 96–106)
Calcium: 8.5 mg/dL — ABNORMAL LOW (ref 8.7–10.2)
Creatinine, Ser: 0.75 mg/dL (ref 0.57–1.00)
GFR calc Af Amer: 120 mL/min/{1.73_m2} (ref >59)
GFR calc non Af Amer: 104 mL/min/{1.73_m2} (ref >59)
GLOBULIN, TOTAL: 2.4 g/dL (ref 1.5–4.5)
Glucose: 93 mg/dL (ref 65–99)
POTASSIUM: 4.6 mmol/L (ref 3.5–5.2)
Sodium: 141 mmol/L (ref 134–144)
Total Protein: 6.9 g/dL (ref 6.0–8.5)

## 2016-04-20 LAB — INSULIN, 2 HOUR: Insulin, 2 hour: 96.5 u[IU]/mL (ref 0.0–145.4)

## 2016-04-20 LAB — HEMOGLOBIN A1C
ESTIMATED AVERAGE GLUCOSE: 108 mg/dL
Hgb A1c MFr Bld: 5.4 % (ref 4.8–5.6)

## 2016-04-20 LAB — VITAMIN D 25 HYDROXY (VIT D DEFICIENCY, FRACTURES): Vit D, 25-Hydroxy: 49.6 ng/mL (ref 30.0–100.0)

## 2016-04-20 LAB — PARATHYROID HORMONE, INTACT (NO CA): PTH: 9 pg/mL — ABNORMAL LOW (ref 15–65)

## 2016-04-26 ENCOUNTER — Ambulatory Visit: Payer: 59 | Admitting: Family Medicine

## 2016-05-13 ENCOUNTER — Other Ambulatory Visit: Payer: Self-pay | Admitting: Family Medicine

## 2016-05-13 DIAGNOSIS — E039 Hypothyroidism, unspecified: Secondary | ICD-10-CM

## 2016-05-13 MED ORDER — SYNTHROID 112 MCG PO TABS: 224.0000 ug | ORAL_TABLET | Freq: Every day | ORAL | 0 refills | Status: DC

## 2016-05-13 NOTE — Telephone Encounter (Signed)
Patient requesting refill of Synthroid and just had lab work done. But her Thyroid Level was abnormal, could you please review and adjust medication as needed.

## 2016-05-13 NOTE — Telephone Encounter (Signed)
I sent a 7 day supply of her current thyroid medicine to [facility] (please cancel, though, since they won't honor a 7 day supply) Then I sent a 7 day supply to local pharmacy of current dose Lab Results  Component Value Date   TSH 4.600 (H) 04/19/2016   T4TOTAL 10.7 03/08/2015    Please ask Dr. Ancil Boozer to review her TSH from March and adjust if necessary Thank you

## 2016-05-20 ENCOUNTER — Other Ambulatory Visit: Payer: Self-pay

## 2016-05-20 DIAGNOSIS — E039 Hypothyroidism, unspecified: Secondary | ICD-10-CM

## 2016-05-20 MED ORDER — SYNTHROID 112 MCG PO TABS: 224.0000 ug | ORAL_TABLET | Freq: Every day | ORAL | 0 refills | Status: DC

## 2016-05-24 ENCOUNTER — Other Ambulatory Visit: Payer: Self-pay | Admitting: Family Medicine

## 2016-05-24 DIAGNOSIS — E039 Hypothyroidism, unspecified: Secondary | ICD-10-CM

## 2016-05-24 MED ORDER — SYNTHROID 112 MCG PO TABS: 224.0000 ug | ORAL_TABLET | Freq: Every day | ORAL | 0 refills | Status: DC

## 2016-05-24 NOTE — Telephone Encounter (Signed)
Pt would like a refill on Synthroid 90 day supply. [facility].

## 2016-06-19 ENCOUNTER — Other Ambulatory Visit: Payer: Self-pay

## 2016-06-19 DIAGNOSIS — E039 Hypothyroidism, unspecified: Secondary | ICD-10-CM

## 2016-06-19 NOTE — Telephone Encounter (Signed)
Pt called back stating she takes Synthroid 112 mcg 2 tabs every morning. Please send in refill to mail order.

## 2016-06-19 NOTE — Telephone Encounter (Signed)
Patient requesting refill of Synthroid to [facility].  

## 2016-06-20 MED ORDER — SYNTHROID 112 MCG PO TABS: 224.0000 ug | ORAL_TABLET | Freq: Every day | ORAL | 0 refills | Status: DC

## 2016-07-17 ENCOUNTER — Encounter: Payer: Self-pay | Admitting: Family Medicine

## 2016-07-17 ENCOUNTER — Other Ambulatory Visit: Payer: Self-pay | Admitting: Family Medicine

## 2016-07-17 DIAGNOSIS — F411 Generalized anxiety disorder: Secondary | ICD-10-CM

## 2016-07-17 DIAGNOSIS — F339 Major depressive disorder, recurrent, unspecified: Secondary | ICD-10-CM

## 2016-07-17 NOTE — Telephone Encounter (Signed)
Patient requesting refill of Cymbalta to [facility].

## 2016-07-23 ENCOUNTER — Other Ambulatory Visit: Payer: Self-pay

## 2016-07-23 DIAGNOSIS — C8198 Hodgkin lymphoma, unspecified, lymph nodes of multiple sites: Secondary | ICD-10-CM

## 2016-07-24 ENCOUNTER — Inpatient Hospital Stay: Payer: 59 | Attending: Oncology

## 2016-07-24 DIAGNOSIS — C8190 Hodgkin lymphoma, unspecified, unspecified site: Secondary | ICD-10-CM | POA: Insufficient documentation

## 2016-07-24 DIAGNOSIS — Z79899 Other long term (current) drug therapy: Secondary | ICD-10-CM | POA: Insufficient documentation

## 2016-07-24 DIAGNOSIS — Z923 Personal history of irradiation: Secondary | ICD-10-CM | POA: Insufficient documentation

## 2016-07-24 DIAGNOSIS — K219 Gastro-esophageal reflux disease without esophagitis: Secondary | ICD-10-CM | POA: Insufficient documentation

## 2016-07-24 DIAGNOSIS — C8198 Hodgkin lymphoma, unspecified, lymph nodes of multiple sites: Secondary | ICD-10-CM

## 2016-07-24 DIAGNOSIS — Z87891 Personal history of nicotine dependence: Secondary | ICD-10-CM | POA: Insufficient documentation

## 2016-07-24 DIAGNOSIS — Z9221 Personal history of antineoplastic chemotherapy: Secondary | ICD-10-CM | POA: Insufficient documentation

## 2016-07-24 DIAGNOSIS — E785 Hyperlipidemia, unspecified: Secondary | ICD-10-CM | POA: Diagnosis not present

## 2016-07-24 DIAGNOSIS — M5136 Other intervertebral disc degeneration, lumbar region: Secondary | ICD-10-CM | POA: Insufficient documentation

## 2016-07-24 DIAGNOSIS — Z803 Family history of malignant neoplasm of breast: Secondary | ICD-10-CM | POA: Diagnosis not present

## 2016-07-24 DIAGNOSIS — F418 Other specified anxiety disorders: Secondary | ICD-10-CM | POA: Insufficient documentation

## 2016-07-24 DIAGNOSIS — Z3202 Encounter for pregnancy test, result negative: Secondary | ICD-10-CM | POA: Insufficient documentation

## 2016-07-24 DIAGNOSIS — E079 Disorder of thyroid, unspecified: Secondary | ICD-10-CM | POA: Diagnosis not present

## 2016-07-24 DIAGNOSIS — E559 Vitamin D deficiency, unspecified: Secondary | ICD-10-CM | POA: Insufficient documentation

## 2016-07-24 LAB — PREGNANCY, URINE: Preg Test, Ur: NEGATIVE

## 2016-07-24 NOTE — Progress Notes (Signed)
[place]  [phone] [phone]  ID: Jo [name] OB: Oct 18, 1980  MR#: [id number]  XNT#:[id number]  Patient Care Team: Steele Sizer, MD as PCP - General (Family Medicine) Christene Lye, MD (General Surgery) Forest Gleason, MD (Unknown Physician Specialty) Steele Sizer, MD as Attending Physician (Family Medicine)  CHIEF COMPLAINT: Hodgkin's lymphoma  INTERVAL HISTORY: Patient returns to clinic today for repeat laboratory, discussion of her imaging results, and further evaluation. She continues to feel well and remains asymptomatic. She denies any fevers, weight loss, or night sweats. She has no neurologic complaints. She denies any weakness or fatigue. She has no chest pain or shortness of breath. She denies any nausea, vomiting, constipation, or diarrhea. She has no urinary complaints. Patient feels at her baseline and offers no specific complaints today.  REVIEW OF SYSTEMS:   Review of Systems  Constitutional: Negative.  Negative for diaphoresis, fever, malaise/fatigue and weight loss.  HENT: Negative.   Respiratory: Negative.  Negative for cough and shortness of breath.   Cardiovascular: Negative.  Negative for chest pain.  Gastrointestinal: Negative.  Negative for abdominal pain.  Genitourinary: Negative.   Musculoskeletal: Negative.   Skin: Negative.  Negative for rash.  Neurological: Negative.  Negative for sensory change and weakness.  Psychiatric/Behavioral: Negative.  The patient is not nervous/anxious.     As per HPI. Otherwise, a complete review of systems is negative.  PAST MEDICAL HISTORY: Past Medical History:  Diagnosis Date  . Anxiety   . Constipation   . Cramp of limb   . Depression   . GERD (gastroesophageal reflux disease)   . Hives   . Hodgkin disease ([place]) 2001 and 2014   chemo and rad tx in 2001 and 2014  . Hodgkin's disease in remission ([place])   . Hyperlipidemia   . Insomnia   . Lymphoma,  Hodgkin's ([place])    2001, 2014  . Migraine   . Obesity   . Oligomenorrhea   . Primary female infertility   . Screening mammogram for high-risk patient   . Seborrhea   . Snoring   . Thyroid disease   . TMJ (dislocation of temporomandibular joint)   . Vitamin D deficiency     PAST SURGICAL HISTORY: Past Surgical History:  Procedure Laterality Date  . lymphonodi biopsy    . PORT-A-CATH REMOVAL  2004  . PORTACATH PLACEMENT  11-24-12  . THYROID SURGERY    . THYROIDECTOMY  10-13-12  . uterine poly removal Left 08/2010   [facility]    FAMILY HISTORY: Family History  Problem Relation Age of Onset  . Hypertension Mother   . Diabetes Father   . Heart disease Father   . CAD Father   . Diabetes Brother   . Breast cancer Paternal Aunt        great aunt >50  . Breast cancer Cousin   . CAD Maternal Grandmother   . Parkinson's disease Maternal Grandmother   . Parkinson's disease Maternal Grandfather   . CAD Paternal Grandmother        ADVANCED DIRECTIVES:    HEALTH MAINTENANCE: Social History  Substance Use Topics  . Smoking status: Former Smoker    Packs/day: 1.00    Years: 0.00    Types: Cigarettes    Quit date: 07/27/2009  . Smokeless tobacco: Never Used  . Alcohol use 0.0 oz/week     Comment: rarely     Colonoscopy:  PAP:  Bone density:  Lipid panel:  Allergies  Allergen Reactions  .  Tape Rash    Uncoded Allergy. Allergen: Msg  . Barium-Containing Compounds Hives    Oral contrast  . Monosodium Glutamate     Unknown.    Current Outpatient Prescriptions  Medication Sig Dispense Refill  . ALPRAZolam (XANAX XR) 1 MG 24 hr tablet Take 1 tablet (1 mg total) by mouth daily. 30 tablet 2  . ALPRAZolam (XANAX) 0.5 MG tablet Take 1 tablet (0.5 mg total) by mouth at bedtime as needed for anxiety. 30 tablet 0  . Biotin 1 MG CAPS Take by mouth.    . calcitRIOL (ROCALTROL) 0.5 MCG capsule     . calcium acetate (PHOSLO) 667 MG tablet Take by mouth.    . desonide (DESOWEN)  0.05 % cream Apply two times daily 180 g 0  . DULoxetine (CYMBALTA) 30 MG capsule TAKE 3 CAPSULES BY MOUTH  DAILY 270 capsule 0  . EPINEPHrine 0.3 mg/0.3 mL IJ SOAJ injection     . fexofenadine (ALLEGRA) 180 MG tablet Take by mouth.    . hydrOXYzine (ATARAX/VISTARIL) 25 MG tablet Take 1 tablet (25 mg total) by mouth every 6 (six) hours as needed. 30 tablet 0  . omeprazole (PRILOSEC) 20 MG capsule Take 1 capsule (20 mg total) by mouth daily. 90 capsule 1  . SYNTHROID 112 MCG tablet Take 2 tablets (224 mcg total) by mouth daily. 180 tablet 0   No current facility-administered medications for this visit.     OBJECTIVE: Vitals:   07/25/16 1546  BP: 112/71  Pulse: 85  Temp: 97.9 F (36.6 C)     Body mass index is 36.66 kg/m.    ECOG FS:0 - Asymptomatic  General: Well-developed, well-nourished, no acute distress. Eyes: Pink conjunctiva, anicteric sclera. Lungs: Clear to auscultation bilaterally. Heart: Regular rate and rhythm. No rubs, murmurs, or gallops. Abdomen: Soft, nontender, nondistended. No organomegaly noted, normoactive bowel sounds. Musculoskeletal: No edema, cyanosis, or clubbing. Neuro: Alert, answering all questions appropriately. Cranial nerves grossly intact. Skin: No rashes or petechiae noted. Psych: Normal affect. Lymphatics: No cervical, calvicular, axillary or inguinal LAD.   LAB RESULTS:  Lab Results  Component Value Date   NA 141 04/19/2016   K 4.6 04/19/2016   CL 97 04/19/2016   CO2 28 03/18/2016   GLUCOSE 93 04/19/2016   BUN 20 04/19/2016   CREATININE 0.75 04/19/2016   CALCIUM 8.5 (L) 04/19/2016   PROT 6.9 04/19/2016   ALBUMIN 4.5 04/19/2016   AST 18 04/19/2016   ALT 16 03/18/2016   ALKPHOS 53 04/19/2016   BILITOT <0.2 04/19/2016   GFRNONAA 104 04/19/2016   GFRAA 120 04/19/2016    Lab Results  Component Value Date   WBC 11.0 (H) 04/19/2016   NEUTROABS 6.9 04/19/2016   HGB 12.3 04/19/2016   HCT 37.9 04/19/2016   MCV 73 (L) 04/19/2016    PLT 295 03/18/2016     STUDIES: Ct Soft Tissue Neck W Contrast  Result Date: 07/25/2016 CLINICAL DATA:  36 year old female restaging Hodgkin lymphoma. EXAM: CT NECK WITH CONTRAST TECHNIQUE: Multidetector CT imaging of the neck was performed using the standard protocol following the bolus administration of intravenous contrast. CONTRAST:  125 mL Isovue 370 in conjunction with contrast enhanced imaging of the chest, abdomen, and pelvis reported separately. COMPARISON:  PET-CT 08/31/2015. Neck CT 11/11/2013. Chest CT today reported separately. FINDINGS: Pharynx and larynx: Larynx and pharynx soft tissue contours are within normal limits. Negative parapharyngeal and retropharyngeal spaces. Salivary glands: Sublingual space, submandibular glands, and parotid glands are within normal limits.  Thyroid: Orthotopic thyroid parenchyma is chronically diminutive or absent. Mild hypertrophy of chronic thyroid tissue along the thyroglossal duct, primarily about the anterior hyoid bone best seen on sagittal image 92. This has not significantly changed since 2015. Lymph nodes: A right level 2 B lymph node has increased since 2015 from 4-5 mm now to 8 mm (series 3, image 48) but has not significantly changed since 2017 and was not hypermetabolic at that time. Stable 5 mm level 1 a lymph node. Other level 1 nodes are stable and diminutive. Bilateral level 2 lymph nodes appear stable since 2015 and are fairly diminutive, with normal fatty hila. Mildly increased but small 3-4 mm right level 5 node on series 3, image 46. Other lymph node stations are normal. Vascular: Major vascular structures in the neck and at the skullbase are patent. Dominant left vertebral artery. No atherosclerosis. Limited intracranial: Negative. Visualized orbits: Negative. Mastoids and visualized paranasal sinuses: Visualized paranasal sinuses and mastoids are stable and well pneumatized. Skeleton: Asymmetric rounded area of sclerosis in the left T1  vertebral body appears stable since 2014. Mildly coarse bone mineralization elsewhere. No acute or suspicious osseous lesion identified in the neck or at the skullbase. Upper chest: Reported separately today. IMPRESSION: 1. No cervical lymphadenopathy or evidence of active lymphoma in the neck. Several subcentimeter nodes are minimally increased since 2015, and attention to these is directed on follow-up. 2. CT chest, abdomen and pelvis today are reported separately. 3. Incidental chronic ectopic thyroid parenchyma along the course of the thyroglossal duct. Electronically Signed   By: Genevie Ann M.D.   On: 07/25/2016 12:07   Ct Chest W Contrast  Result Date: 07/25/2016 CLINICAL DATA:  Restaging Hodgkin's lymphoma EXAM: CT CHEST, ABDOMEN, AND PELVIS WITH CONTRAST TECHNIQUE: Multidetector CT imaging of the chest, abdomen and pelvis was performed following the standard protocol during bolus administration of intravenous contrast. CONTRAST:  125 cc Isovue 370 COMPARISON:  Multiple exams, including 08/31/2015 FINDINGS: CT CHEST FINDINGS Cardiovascular: Unremarkable Mediastinum/Nodes: Chronically calcified anterior mediastinal structure compatible with previously treated adenopathy. Contrast medium in the esophagus suggesting dysmotility or gastroesophageal reflux. No pathologic adenopathy. Lungs/Pleura: Stable 4 mm right lower lobe nodule, image 95/5, no change from 11/11/2013. Peripheral triangular 1.3 by 1.0 cm sub solid density in the left lower lobe on image 78/5, new compared to prior exams, inflammatory etiology favored. Sub solid 0.5 by 0.3 cm nodule in the left lower lobe on image 104/5, not seen on prior exams. Musculoskeletal: Chronic nonspecific sclerotic lesion in the T1 vertebral body eccentric to the left, no change from the 09/16/2012, an without elevated metabolic activity on PET-CT examination is. Thoracic spondylosis is somewhat advanced for age. 6 mm sclerotic lesion eccentric to the left in the T8  vertebral body, nonspecific but unchanged from 11/11/2013. CT ABDOMEN PELVIS FINDINGS Hepatobiliary: Unremarkable Pancreas: Unremarkable Spleen: Unremarkable Adrenals/Urinary Tract: 3.4 by 1.3 cm right adrenal mass, image 59/3 this was not hypermetabolic on the PET-CT of 08/31/2015, and measured the same. The kidneys appear unremarkable, as does the urinary bladder. Stomach/Bowel: Prominent stool throughout the colon favors constipation. Vascular/Lymphatic: No pathologic adenopathy identified. Reproductive: 2.5 by 2.6 cm mass along the right side of the lower uterine segment, image 109/3, not appreciably changed from 03/30/2010, favoring a fibroid. Ovaries unremarkable. Other: No supplemental non-categorized findings. Musculoskeletal: Degenerative disc disease and mild spondylosis at the L5-S1 level, without overt impingement. IMPRESSION: 1. No current findings of active lymphoma. No enlarged lymph nodes identified. 2. Several small sclerotic lesions in the  T1 and T8 vertebral, not changed from 2015, and not recently hypermetabolic, compatible with benign lesions or conceivably successfully treated bony manifestation of lymphoma. 3. New peripheral triangular sub solid density in the left lower lobe, inflammatory etiology favored, merit surveillance on follow up studies. There is also a 4 mm sub solid lesion in the left lower lobe which is new. Again, both are probably inflammatory. 4. Esophageal dysmotility versus gastroesophageal reflux. 5. Thoracic spondylosis along with degenerative disc disease at L5-S 1. 6. Stable right adrenal mass, not hypermetabolic on recent PET-CT, probably an adenoma or similar benign lesion. 7.  Prominent stool throughout the colon favors constipation. 8. Small mass along the right side of lower uterine segment, likely a fibroid. Electronically Signed   By: Van Clines M.D.   On: 07/25/2016 13:47   Ct Abdomen Pelvis W Contrast  Result Date: 07/25/2016 CLINICAL DATA:  Restaging  Hodgkin's lymphoma EXAM: CT CHEST, ABDOMEN, AND PELVIS WITH CONTRAST TECHNIQUE: Multidetector CT imaging of the chest, abdomen and pelvis was performed following the standard protocol during bolus administration of intravenous contrast. CONTRAST:  125 cc Isovue 370 COMPARISON:  Multiple exams, including 08/31/2015 FINDINGS: CT CHEST FINDINGS Cardiovascular: Unremarkable Mediastinum/Nodes: Chronically calcified anterior mediastinal structure compatible with previously treated adenopathy. Contrast medium in the esophagus suggesting dysmotility or gastroesophageal reflux. No pathologic adenopathy. Lungs/Pleura: Stable 4 mm right lower lobe nodule, image 95/5, no change from 11/11/2013. Peripheral triangular 1.3 by 1.0 cm sub solid density in the left lower lobe on image 78/5, new compared to prior exams, inflammatory etiology favored. Sub solid 0.5 by 0.3 cm nodule in the left lower lobe on image 104/5, not seen on prior exams. Musculoskeletal: Chronic nonspecific sclerotic lesion in the T1 vertebral body eccentric to the left, no change from the 09/16/2012, an without elevated metabolic activity on PET-CT examination is. Thoracic spondylosis is somewhat advanced for age. 6 mm sclerotic lesion eccentric to the left in the T8 vertebral body, nonspecific but unchanged from 11/11/2013. CT ABDOMEN PELVIS FINDINGS Hepatobiliary: Unremarkable Pancreas: Unremarkable Spleen: Unremarkable Adrenals/Urinary Tract: 3.4 by 1.3 cm right adrenal mass, image 59/3 this was not hypermetabolic on the PET-CT of 08/31/2015, and measured the same. The kidneys appear unremarkable, as does the urinary bladder. Stomach/Bowel: Prominent stool throughout the colon favors constipation. Vascular/Lymphatic: No pathologic adenopathy identified. Reproductive: 2.5 by 2.6 cm mass along the right side of the lower uterine segment, image 109/3, not appreciably changed from 03/30/2010, favoring a fibroid. Ovaries unremarkable. Other: No supplemental  non-categorized findings. Musculoskeletal: Degenerative disc disease and mild spondylosis at the L5-S1 level, without overt impingement. IMPRESSION: 1. No current findings of active lymphoma. No enlarged lymph nodes identified. 2. Several small sclerotic lesions in the T1 and T8 vertebral, not changed from 2015, and not recently hypermetabolic, compatible with benign lesions or conceivably successfully treated bony manifestation of lymphoma. 3. New peripheral triangular sub solid density in the left lower lobe, inflammatory etiology favored, merit surveillance on follow up studies. There is also a 4 mm sub solid lesion in the left lower lobe which is new. Again, both are probably inflammatory. 4. Esophageal dysmotility versus gastroesophageal reflux. 5. Thoracic spondylosis along with degenerative disc disease at L5-S 1. 6. Stable right adrenal mass, not hypermetabolic on recent PET-CT, probably an adenoma or similar benign lesion. 7.  Prominent stool throughout the colon favors constipation. 8. Small mass along the right side of lower uterine segment, likely a fibroid. Electronically Signed   By: Van Clines M.D.   On: 07/25/2016  13:47    ASSESSMENT: Recurrent stage IV Hodgkin's lymphoma, in remission.  PLAN:    1. Hodgkin's lymphoma, by report patient had stage IV disease. She was initially diagnosed in 2001 and received MOPP chemotherapy. Patient remained in remission until she had a local recurrence in 2014 in her neck confirmed by lymph node excision. She received 2 cycles of ABVD and then XRT for local control. PET scan on September 01, 2015 reviewed independently with no evidence of recurrence. Repeat CT scan on July 25, 2016 reviewed independently and reported as above with continued remission. Return to clinic in 1 year with repeat imaging and further evaluation. 2. Anemia: Resolved.  Patient expressed understanding and was in agreement with this plan. She also understands that She can call  clinic at any time with any questions, concerns, or complaints.    Lloyd Huger, MD   07/25/2016 3:54 PM

## 2016-07-25 ENCOUNTER — Ambulatory Visit
Admission: RE | Admit: 2016-07-25 | Discharge: 2016-07-25 | Disposition: A | Discharge status: Home / Self Care | Payer: 59 | Source: Ambulatory Visit | Attending: Oncology | Admitting: Oncology

## 2016-07-25 ENCOUNTER — Inpatient Hospital Stay (HOSPITAL_BASED_OUTPATIENT_CLINIC_OR_DEPARTMENT_OTHER): Payer: 59 | Admitting: Oncology

## 2016-07-25 VITALS — BP 112/71 | HR 85 | Temp 97.9°F | Wt 227.1 lb

## 2016-07-25 DIAGNOSIS — E785 Hyperlipidemia, unspecified: Secondary | ICD-10-CM | POA: Diagnosis not present

## 2016-07-25 DIAGNOSIS — Z9221 Personal history of antineoplastic chemotherapy: Secondary | ICD-10-CM

## 2016-07-25 DIAGNOSIS — N859 Noninflammatory disorder of uterus, unspecified: Secondary | ICD-10-CM | POA: Insufficient documentation

## 2016-07-25 DIAGNOSIS — C8190 Hodgkin lymphoma, unspecified, unspecified site: Secondary | ICD-10-CM

## 2016-07-25 DIAGNOSIS — E279 Disorder of adrenal gland, unspecified: Secondary | ICD-10-CM | POA: Insufficient documentation

## 2016-07-25 DIAGNOSIS — F418 Other specified anxiety disorders: Secondary | ICD-10-CM

## 2016-07-25 DIAGNOSIS — C8198 Hodgkin lymphoma, unspecified, lymph nodes of multiple sites: Secondary | ICD-10-CM | POA: Diagnosis not present

## 2016-07-25 DIAGNOSIS — M5137 Other intervertebral disc degeneration, lumbosacral region: Secondary | ICD-10-CM | POA: Insufficient documentation

## 2016-07-25 DIAGNOSIS — M899 Disorder of bone, unspecified: Secondary | ICD-10-CM | POA: Diagnosis not present

## 2016-07-25 DIAGNOSIS — E559 Vitamin D deficiency, unspecified: Secondary | ICD-10-CM

## 2016-07-25 DIAGNOSIS — Z79899 Other long term (current) drug therapy: Secondary | ICD-10-CM

## 2016-07-25 DIAGNOSIS — K219 Gastro-esophageal reflux disease without esophagitis: Secondary | ICD-10-CM

## 2016-07-25 DIAGNOSIS — M5136 Other intervertebral disc degeneration, lumbar region: Secondary | ICD-10-CM

## 2016-07-25 DIAGNOSIS — Z87891 Personal history of nicotine dependence: Secondary | ICD-10-CM

## 2016-07-25 DIAGNOSIS — Z923 Personal history of irradiation: Secondary | ICD-10-CM

## 2016-07-25 DIAGNOSIS — K5900 Constipation, unspecified: Secondary | ICD-10-CM | POA: Diagnosis not present

## 2016-07-25 DIAGNOSIS — E079 Disorder of thyroid, unspecified: Secondary | ICD-10-CM

## 2016-07-25 DIAGNOSIS — M47894 Other spondylosis, thoracic region: Secondary | ICD-10-CM | POA: Diagnosis not present

## 2016-07-25 MED ORDER — IOPAMIDOL (ISOVUE-370) INJECTION 76%
125.0000 mL | Freq: Once | INTRAVENOUS | Status: AC | PRN
  Administered 2016-07-25: 125 mL via INTRAVENOUS

## 2016-07-26 ENCOUNTER — Encounter: Payer: Self-pay | Admitting: Family Medicine

## 2016-07-26 ENCOUNTER — Ambulatory Visit (INDEPENDENT_AMBULATORY_CARE_PROVIDER_SITE_OTHER): Payer: 59 | Admitting: Family Medicine

## 2016-07-26 VITALS — BP 114/68 | HR 94 | Temp 97.7°F | Resp 16 | Ht 66.0 in | Wt 228.4 lb

## 2016-07-26 DIAGNOSIS — E039 Hypothyroidism, unspecified: Secondary | ICD-10-CM

## 2016-07-26 DIAGNOSIS — Z79899 Other long term (current) drug therapy: Secondary | ICD-10-CM

## 2016-07-26 DIAGNOSIS — E209 Hypoparathyroidism, unspecified: Secondary | ICD-10-CM

## 2016-07-26 DIAGNOSIS — C8190 Hodgkin lymphoma, unspecified, unspecified site: Secondary | ICD-10-CM

## 2016-07-26 DIAGNOSIS — E8881 Metabolic syndrome: Secondary | ICD-10-CM

## 2016-07-26 DIAGNOSIS — F339 Major depressive disorder, recurrent, unspecified: Secondary | ICD-10-CM

## 2016-07-26 DIAGNOSIS — F411 Generalized anxiety disorder: Secondary | ICD-10-CM | POA: Diagnosis not present

## 2016-07-26 DIAGNOSIS — E890 Postprocedural hypothyroidism: Secondary | ICD-10-CM

## 2016-07-26 DIAGNOSIS — K219 Gastro-esophageal reflux disease without esophagitis: Secondary | ICD-10-CM | POA: Diagnosis not present

## 2016-07-26 MED ORDER — ALPRAZOLAM 0.5 MG PO TABS: 0.5000 mg | ORAL_TABLET | Freq: Every evening | ORAL | 0 refills | Status: DC | PRN

## 2016-07-26 MED ORDER — ALPRAZOLAM ER 1 MG PO TB24: 1.0000 mg | ORAL_TABLET | Freq: Every day | ORAL | 2 refills | Status: DC

## 2016-07-26 NOTE — Addendum Note (Signed)
Addended by: Lolita Rieger D on: 07/26/2016 04:23 PM   Modules accepted: Orders
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
## 2016-07-27 LAB — VITAMIN D 25 HYDROXY (VIT D DEFICIENCY, FRACTURES): VIT D 25 HYDROXY: 48.4 ng/mL (ref 30.0–100.0)

## 2016-07-27 LAB — CBC WITH DIFFERENTIAL
BASOS ABS: 0.1 10*3/uL (ref 0.0–0.2)
Basos: 1 %
EOS (ABSOLUTE): 0.4 10*3/uL (ref 0.0–0.4)
Eos: 3 %
HEMOGLOBIN: 12.3 g/dL (ref 11.1–15.9)
Hematocrit: 40.5 % (ref 34.0–46.6)
Immature Grans (Abs): 0 10*3/uL (ref 0.0–0.1)
Immature Granulocytes: 0 %
LYMPHS ABS: 3.2 10*3/uL — AB (ref 0.7–3.1)
Lymphs: 28 %
MCH: 24.1 pg — AB (ref 26.6–33.0)
MCHC: 30.4 g/dL — AB (ref 31.5–35.7)
MCV: 79 fL (ref 79–97)
MONOCYTES: 6 %
MONOS ABS: 0.7 10*3/uL (ref 0.1–0.9)
NEUTROS ABS: 7.1 10*3/uL — AB (ref 1.4–7.0)
Neutrophils: 62 %
RBC: 5.1 x10E6/uL (ref 3.77–5.28)
RDW: 16.3 % — AB (ref 12.3–15.4)
WBC: 11.4 10*3/uL — ABNORMAL HIGH (ref 3.4–10.8)

## 2016-07-27 LAB — COMP. METABOLIC PANEL (12)
A/G RATIO: 1.7 (ref 1.2–2.2)
ALBUMIN: 4.5 g/dL (ref 3.5–5.5)
AST: 17 IU/L (ref 0–40)
Alkaline Phosphatase: 52 IU/L (ref 39–117)
BUN / CREAT RATIO: 25 — AB (ref 9–23)
BUN: 19 mg/dL (ref 6–20)
Bilirubin Total: <0.2 mg/dL (ref 0.0–1.2)
CALCIUM: 8.3 mg/dL — AB (ref 8.7–10.2)
Chloride: 97 mmol/L (ref 96–106)
Creatinine, Ser: 0.75 mg/dL (ref 0.57–1.00)
GFR, EST AFRICAN AMERICAN: 120 mL/min/{1.73_m2} (ref >59)
GFR, EST NON AFRICAN AMERICAN: 104 mL/min/{1.73_m2} (ref >59)
GLOBULIN, TOTAL: 2.6 g/dL (ref 1.5–4.5)
GLUCOSE: 79 mg/dL (ref 65–99)
POTASSIUM: 4.5 mmol/L (ref 3.5–5.2)
SODIUM: 140 mmol/L (ref 134–144)
TOTAL PROTEIN: 7.1 g/dL (ref 6.0–8.5)

## 2016-07-27 LAB — PARATHYROID HORMONE, INTACT (NO CA): PTH: 12 pg/mL — ABNORMAL LOW (ref 15–65)

## 2016-07-27 LAB — TSH: TSH: 24.65 u[IU]/mL — ABNORMAL HIGH (ref 0.450–4.500)

## 2016-08-02 ENCOUNTER — Telehealth: Payer: Self-pay | Admitting: Family Medicine

## 2016-08-02 NOTE — Telephone Encounter (Signed)
Pt states that xanex xr was sent to the wrong pharmacy. Please send to [facility]. States that it was sent to [facility] in [place] by mistake and they will not transfer it.  [phone]

## 2016-08-02 NOTE — Telephone Encounter (Signed)
Called [facility] in [place] and spoke with Lattie Haw and was informed that they have not received an RX for Xanax XR and they could also see that it wasn't sent to [place] either.  I called and left patient a voice mail message informing her to check her paperwork from her visit on 07/26/16 because the RX was printed for her to take to the pharmacy.

## 2016-08-11 ENCOUNTER — Other Ambulatory Visit: Payer: Self-pay | Admitting: Family Medicine

## 2016-08-11 DIAGNOSIS — E890 Postprocedural hypothyroidism: Secondary | ICD-10-CM

## 2016-08-11 NOTE — Progress Notes (Signed)
Please make sure she was just taking one pill daily, since rx says 2 daily, otherwise her levels will get worse instead of improving.

## 2016-08-13 ENCOUNTER — Other Ambulatory Visit: Payer: Self-pay

## 2016-08-13 DIAGNOSIS — E890 Postprocedural hypothyroidism: Secondary | ICD-10-CM

## 2016-08-26 ENCOUNTER — Ambulatory Visit: Payer: 59

## 2016-08-29 ENCOUNTER — Ambulatory Visit: Payer: 59 | Admitting: Oncology

## 2016-10-28 ENCOUNTER — Ambulatory Visit: Payer: 59 | Admitting: Family Medicine

## 2016-12-05 DIAGNOSIS — F3341 Major depressive disorder, recurrent, in partial remission: Secondary | ICD-10-CM | POA: Insufficient documentation

## 2016-12-06 ENCOUNTER — Other Ambulatory Visit: Payer: Self-pay | Admitting: Family Medicine

## 2016-12-06 DIAGNOSIS — R221 Localized swelling, mass and lump, neck: Secondary | ICD-10-CM

## 2016-12-06 DIAGNOSIS — R7303 Prediabetes: Secondary | ICD-10-CM | POA: Insufficient documentation

## 2016-12-09 ENCOUNTER — Other Ambulatory Visit: Payer: Self-pay | Admitting: Family Medicine

## 2016-12-09 DIAGNOSIS — E890 Postprocedural hypothyroidism: Secondary | ICD-10-CM

## 2016-12-09 DIAGNOSIS — E039 Hypothyroidism, unspecified: Secondary | ICD-10-CM

## 2016-12-09 NOTE — Telephone Encounter (Signed)
Refill request for thyroid medication:  Last Physical: none indicated  Last visit: 07/26/2016   Lab Results  Component Value Date   TSH 24.650 (H) 07/26/2016     Follow up: none indicated.

## 2016-12-10 ENCOUNTER — Ambulatory Visit
Admission: RE | Admit: 2016-12-10 | Discharge: 2016-12-10 | Disposition: A | Discharge status: Home / Self Care | Payer: BLUE CROSS/BLUE SHIELD | Source: Ambulatory Visit | Attending: Family Medicine | Admitting: Family Medicine

## 2016-12-10 DIAGNOSIS — R221 Localized swelling, mass and lump, neck: Secondary | ICD-10-CM | POA: Diagnosis present

## 2016-12-10 DIAGNOSIS — E890 Postprocedural hypothyroidism: Secondary | ICD-10-CM | POA: Insufficient documentation

## 2016-12-10 DIAGNOSIS — I6521 Occlusion and stenosis of right carotid artery: Secondary | ICD-10-CM | POA: Diagnosis not present

## 2016-12-13 NOTE — Telephone Encounter (Signed)
She needs to have labs done, give enough for the weekend only

## 2016-12-13 NOTE — Telephone Encounter (Signed)
Please disregard refill request. Patient has a new pcp closer to her work and home. She does not need refill.

## 2017-07-21 ENCOUNTER — Other Ambulatory Visit: Payer: Self-pay | Admitting: *Deleted

## 2017-07-21 DIAGNOSIS — C8190 Hodgkin lymphoma, unspecified, unspecified site: Secondary | ICD-10-CM

## 2017-07-22 ENCOUNTER — Telehealth: Payer: Self-pay | Admitting: Oncology

## 2017-07-22 NOTE — Telephone Encounter (Signed)
Per [employer] from [facility]. Pt would like to r\s CT scan and MD appt. Left pt VM to contact office so I can see when she would like these moved too. All appts are canceled per patient request.

## 2017-07-25 ENCOUNTER — Other Ambulatory Visit: Payer: 59

## 2017-07-25 ENCOUNTER — Ambulatory Visit: Admission: RE | Admit: 2017-07-25 | Payer: BLUE CROSS/BLUE SHIELD | Source: Ambulatory Visit

## 2017-07-29 ENCOUNTER — Ambulatory Visit: Payer: 59 | Admitting: Oncology

## 2018-02-10 IMAGING — US US SOFT TISSUE HEAD/NECK
1 series · 14 of 21 positions shown · non-contrast
Comparison: Neck CT - 07/25/2016

CLINICAL DATA: Neck fullness for the past month. Shortness of
breath. History of total thyroidectomy.

EXAM:
THYROID ULTRASOUND
TECHNIQUE: Ultrasound examination of the thyroid gland and adjacent soft
tissues was performed.

[Series 1: us soft tissue head/neck · 0.07mm/px · 21 acquisitions, 14 frames shown]
[im 1/21]
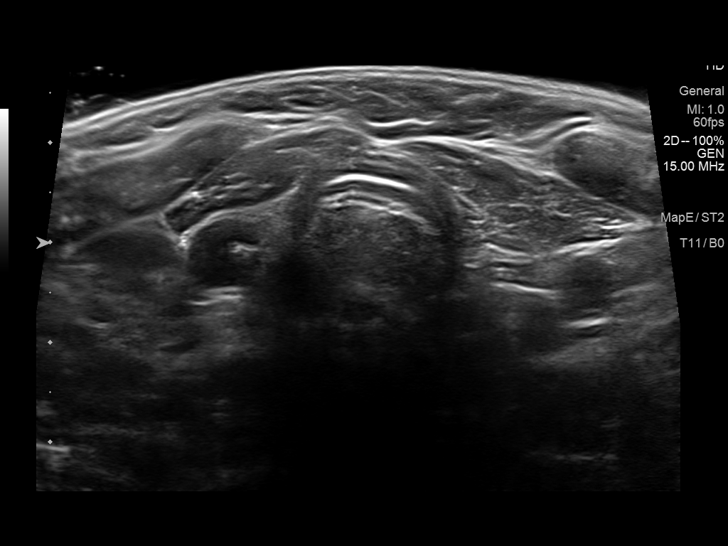
[im 3/21]
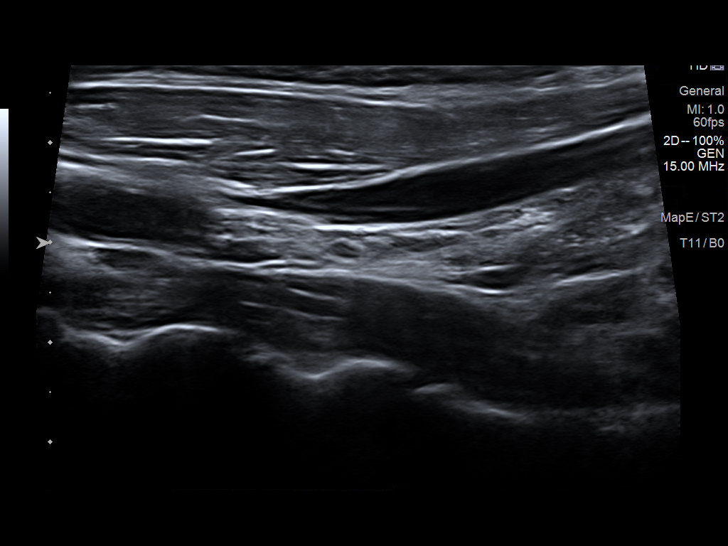
[im 4/21]
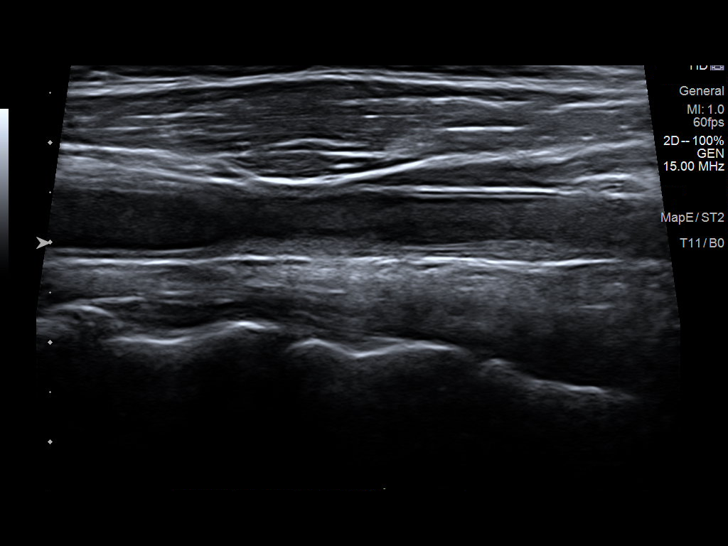
[im 6/21]
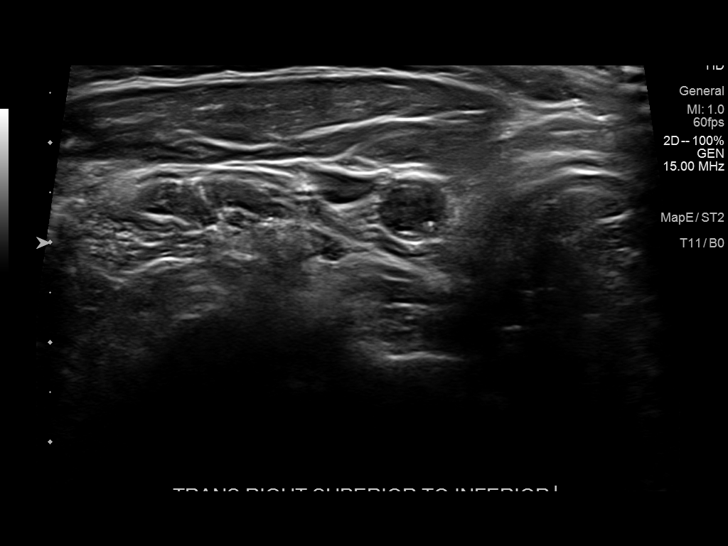
[im 7/21]
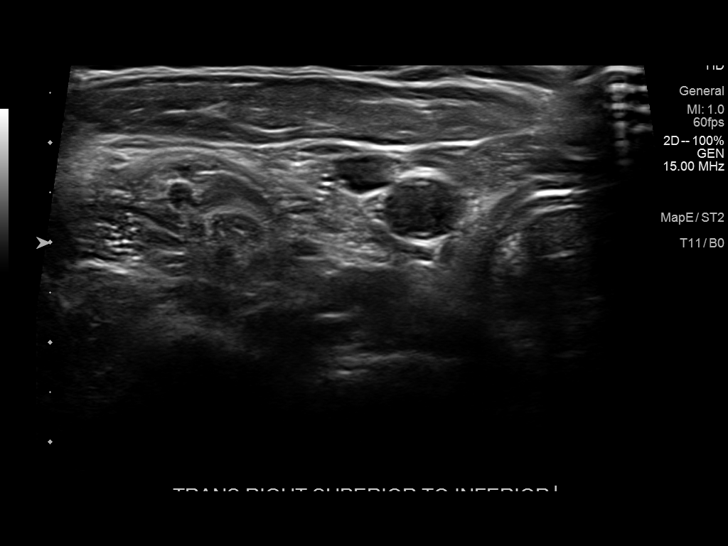
[im 9/21]
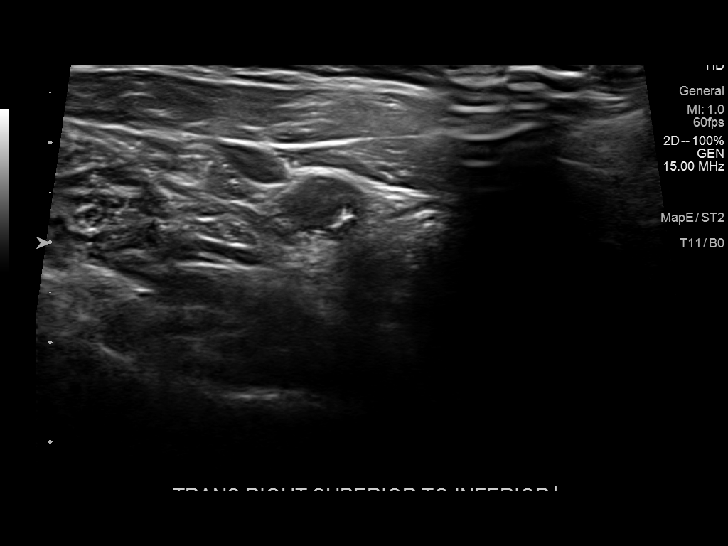
[im 10/21]
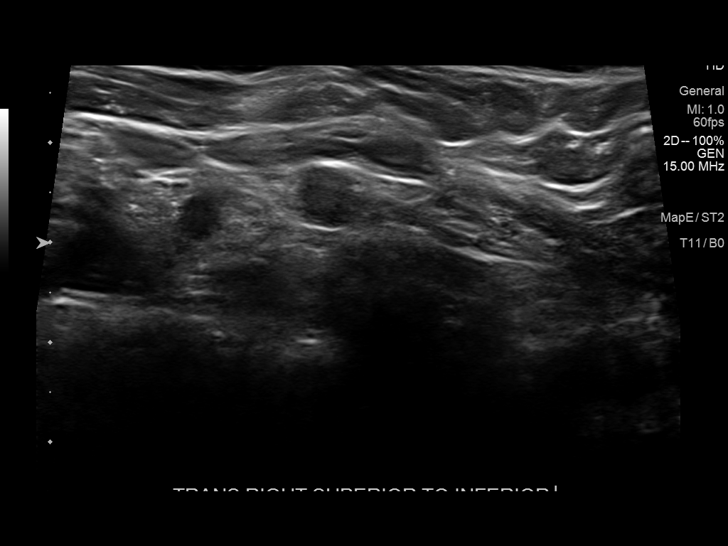
[im 12/21]
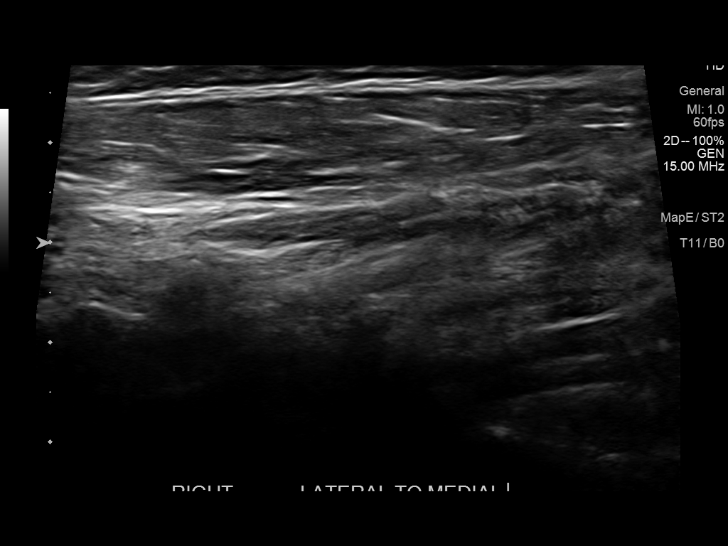
[im 13/21]
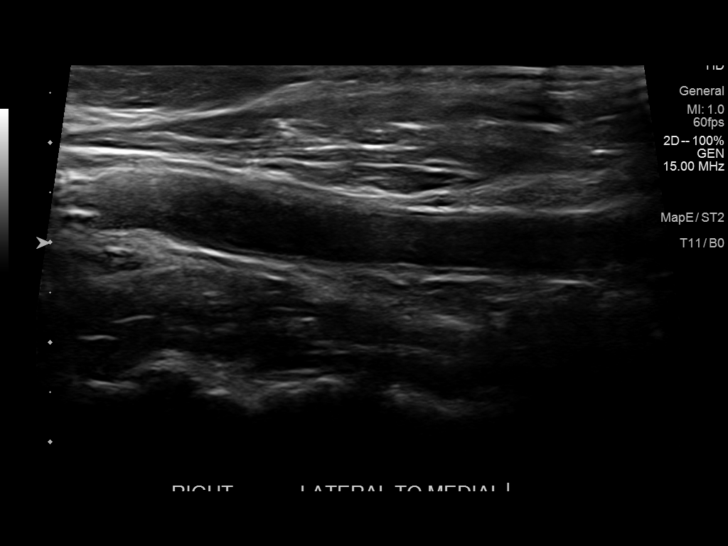
[im 15/21]
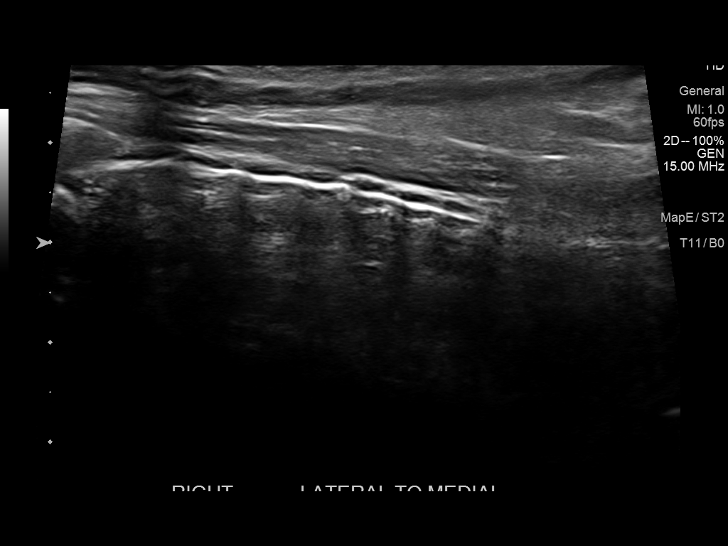
[im 16/21]
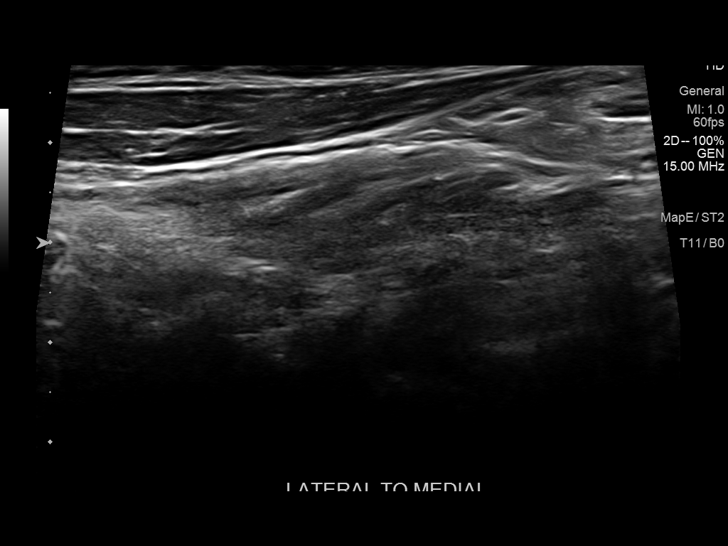
[im 18/21]
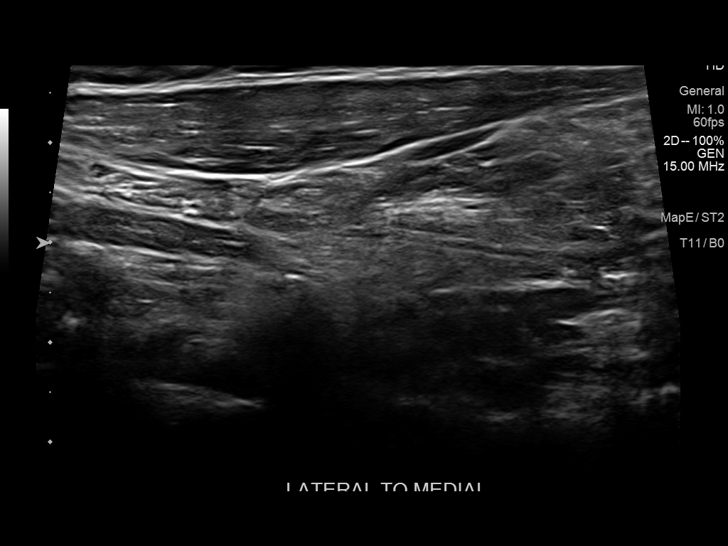
[im 19/21]
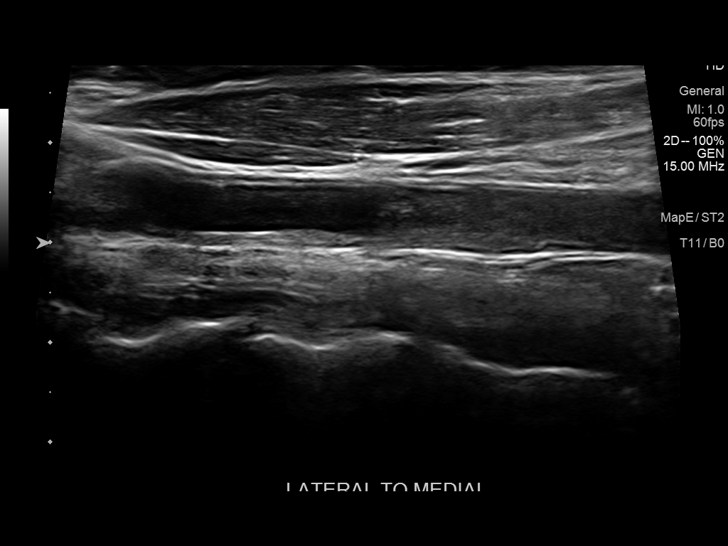
[im 21/21]
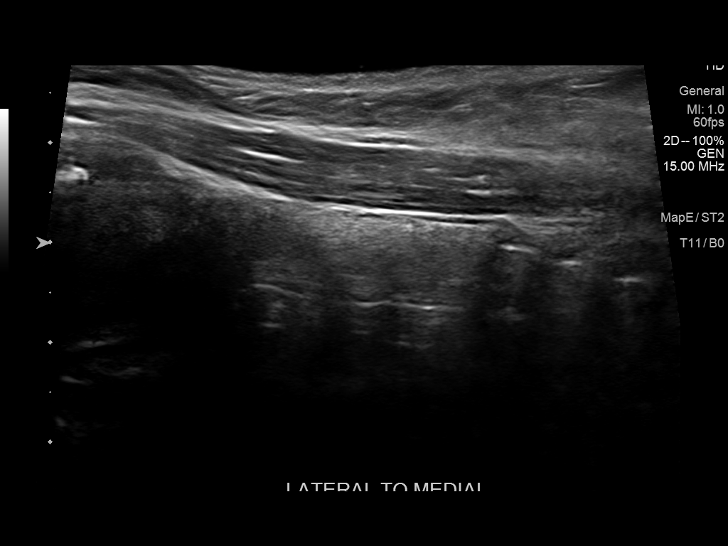

[14 of 21 positions shown; findings below may reference images not displayed]

FINDINGS: Isthmus: Surgically absent. There is no residual nodular soft tissue
within the isthmic resection bed.

Right lobe: Surgically absent. There is no residual nodular soft
tissue within the right lobectomy resection bed.

Left lobe: Surgically absent. There is no residual nodular soft
tissue within the left lobectomy resection bed.

_________________________________________________________

No regional cervical lymphadenopathy.

Note is made of a minimal amount of echogenic atherosclerotic plaque
within the proximal aspect the right common carotid artery (images 7
and 10).
IMPRESSION: 1. Post total thyroidectomy without evidence of locally recurrent or
residual disease.
2. No explanation for patient's neck fullness. Specifically, no
discrete solid or cystic mass. No cervical lymphadenopathy.

## 2018-03-13 DIAGNOSIS — Z0000 Encounter for general adult medical examination without abnormal findings: Secondary | ICD-10-CM | POA: Diagnosis not present

## 2018-03-13 DIAGNOSIS — E890 Postprocedural hypothyroidism: Secondary | ICD-10-CM | POA: Diagnosis not present

## 2018-03-13 DIAGNOSIS — E209 Hypoparathyroidism, unspecified: Secondary | ICD-10-CM | POA: Diagnosis not present

## 2018-03-13 DIAGNOSIS — Z1239 Encounter for other screening for malignant neoplasm of breast: Secondary | ICD-10-CM | POA: Diagnosis not present

## 2019-01-19 ENCOUNTER — Other Ambulatory Visit: Payer: Self-pay

## 2019-01-19 DIAGNOSIS — Z20822 Contact with and (suspected) exposure to covid-19: Secondary | ICD-10-CM

## 2019-01-20 LAB — NOVEL CORONAVIRUS, NAA: SARS-CoV-2, NAA: NOT DETECTED

## 2019-01-21 DIAGNOSIS — R112 Nausea with vomiting, unspecified: Secondary | ICD-10-CM | POA: Diagnosis not present

## 2019-01-21 DIAGNOSIS — R5383 Other fatigue: Secondary | ICD-10-CM | POA: Diagnosis not present

## 2019-01-21 DIAGNOSIS — R197 Diarrhea, unspecified: Secondary | ICD-10-CM | POA: Diagnosis not present

## 2019-01-22 DIAGNOSIS — Z20828 Contact with and (suspected) exposure to other viral communicable diseases: Secondary | ICD-10-CM | POA: Diagnosis not present

## 2019-02-26 DIAGNOSIS — Z1159 Encounter for screening for other viral diseases: Secondary | ICD-10-CM | POA: Diagnosis not present

## 2019-02-26 DIAGNOSIS — R5383 Other fatigue: Secondary | ICD-10-CM | POA: Diagnosis not present

## 2019-02-26 DIAGNOSIS — Z13220 Encounter for screening for lipoid disorders: Secondary | ICD-10-CM | POA: Diagnosis not present

## 2019-02-26 DIAGNOSIS — E890 Postprocedural hypothyroidism: Secondary | ICD-10-CM | POA: Diagnosis not present

## 2019-02-26 DIAGNOSIS — Z79899 Other long term (current) drug therapy: Secondary | ICD-10-CM | POA: Diagnosis not present

## 2019-05-17 DIAGNOSIS — H5203 Hypermetropia, bilateral: Secondary | ICD-10-CM | POA: Diagnosis not present

## 2019-08-12 ENCOUNTER — Other Ambulatory Visit: Payer: Self-pay

## 2019-08-12 ENCOUNTER — Ambulatory Visit (INDEPENDENT_AMBULATORY_CARE_PROVIDER_SITE_OTHER): Payer: BC Managed Care – PPO | Admitting: Family Medicine

## 2019-08-12 ENCOUNTER — Encounter: Payer: Self-pay | Admitting: Family Medicine

## 2019-08-12 VITALS — BP 140/70 | HR 72 | Ht 66.0 in | Wt 240.0 lb

## 2019-08-12 DIAGNOSIS — F339 Major depressive disorder, recurrent, unspecified: Secondary | ICD-10-CM

## 2019-08-12 DIAGNOSIS — C8198 Hodgkin lymphoma, unspecified, lymph nodes of multiple sites: Secondary | ICD-10-CM

## 2019-08-12 DIAGNOSIS — Z7689 Persons encountering health services in other specified circumstances: Secondary | ICD-10-CM | POA: Diagnosis not present

## 2019-08-12 DIAGNOSIS — E890 Postprocedural hypothyroidism: Secondary | ICD-10-CM

## 2019-08-12 DIAGNOSIS — L219 Seborrheic dermatitis, unspecified: Secondary | ICD-10-CM

## 2019-08-12 DIAGNOSIS — F411 Generalized anxiety disorder: Secondary | ICD-10-CM

## 2019-08-12 DIAGNOSIS — D509 Iron deficiency anemia, unspecified: Secondary | ICD-10-CM | POA: Diagnosis not present

## 2019-08-12 DIAGNOSIS — E892 Postprocedural hypoparathyroidism: Secondary | ICD-10-CM

## 2019-08-12 DIAGNOSIS — L508 Other urticaria: Secondary | ICD-10-CM

## 2019-08-12 MED ORDER — LEVOTHYROXINE SODIUM 150 MCG PO TABS: 150.0000 ug | ORAL_TABLET | Freq: Every day | ORAL | 3 refills | Status: DC

## 2019-08-12 MED ORDER — SERTRALINE HCL 50 MG PO TABS: 50.0000 mg | ORAL_TABLET | Freq: Every day | ORAL | 3 refills | Status: DC

## 2019-08-12 NOTE — Progress Notes (Signed)
Date:  08/12/2019   Name:  Jo [name]   DOB:  02-07-1981   MRN:  [id number]   Chief Complaint: Establish Care (doctor left from previous facility), Hypothyroidism (recheck thyroid levels), and hemoglobin (recheck hgb- low reading in [place])  Patient is a 39 year old female who presents for a establish care exam. The patient reports the following problems: hypothyroid/hypoparathyroid with hypocalcemia/anemia likely iron deficient/hx of Hodgkins. Health maintenance has been reviewed pap.  Thyroid Problem Presents for follow-up visit. Symptoms include dry skin, fatigue, hair loss, heat intolerance and weight gain. Patient reports no anxiety, cold intolerance, constipation, depressed mood, diaphoresis, diarrhea, hoarse voice, leg swelling, menstrual problem, nail problem, palpitations, tremors, visual change or weight loss. The symptoms have been stable.  Anemia Presents for follow-up visit. There has been no abdominal pain, anorexia, bruising/bleeding easily, confusion, fever, leg swelling, light-headedness, malaise/fatigue, pallor, palpitations, paresthesias, pica or weight loss. Signs of blood loss that are not present include hematemesis, hematochezia, melena, menorrhagia and vaginal bleeding. There are no compliance problems.     Lab Results  Component Value Date   CREATININE 0.75 07/26/2016   BUN 19 07/26/2016   NA 140 07/26/2016   K 4.5 07/26/2016   CL 97 07/26/2016   CO2 28 03/18/2016   Lab Results  Component Value Date   CHOL 170 04/19/2016   HDL 51 04/19/2016   LDLCALC 96 04/19/2016   TRIG 113 04/19/2016   CHOLHDL 3.3 04/19/2016   Lab Results  Component Value Date   TSH 24.650 (H) 07/26/2016   Lab Results  Component Value Date   HGBA1C 5.4 04/19/2016   Lab Results  Component Value Date   WBC 11.4 (H) 07/26/2016   HGB 12.3 07/26/2016   HCT 40.5 07/26/2016   MCV 79 07/26/2016   PLT 295 03/18/2016   Lab Results  Component Value Date   ALT 16 03/18/2016   AST  17 07/26/2016   ALKPHOS 52 07/26/2016   BILITOT <0.2 07/26/2016     Review of Systems  Constitutional: Positive for fatigue and weight gain. Negative for chills, diaphoresis, fever, malaise/fatigue, unexpected weight change and weight loss.  HENT: Negative for congestion, ear discharge, ear pain, hoarse voice, rhinorrhea, sinus pressure, sneezing and sore throat.   Eyes: Negative for photophobia, pain, discharge, redness and itching.  Respiratory: Negative for cough, shortness of breath, wheezing and stridor.   Cardiovascular: Negative for palpitations.  Gastrointestinal: Negative for abdominal pain, anorexia, blood in stool, constipation, diarrhea, hematemesis, hematochezia, melena, nausea and vomiting.  Endocrine: Positive for heat intolerance. Negative for cold intolerance, polydipsia, polyphagia and polyuria.  Genitourinary: Negative for dysuria, flank pain, frequency, hematuria, menorrhagia, menstrual problem, pelvic pain, urgency, vaginal bleeding and vaginal discharge.  Musculoskeletal: Negative for arthralgias, back pain and myalgias.  Skin: Negative for pallor and rash.  Allergic/Immunologic: Negative for environmental allergies and food allergies.  Neurological: Negative for dizziness, tremors, weakness, light-headedness, numbness, headaches and paresthesias.  Hematological: Negative for adenopathy. Does not bruise/bleed easily.  Psychiatric/Behavioral: Negative for confusion and dysphoric mood. The patient is not nervous/anxious.     Patient Active Problem List   Diagnosis Date Noted  . Tension type headache 04/27/2015  . Hypocalcemia 04/17/2015  . GAD (generalized anxiety disorder) 10/18/2014  . RLS (restless legs syndrome) 10/18/2014  . Insomnia, persistent 10/18/2014  . Migraine without aura and responsive to treatment 10/18/2014  . CN (constipation) 10/18/2014  . Major depression, recurrent, chronic ([place]) 10/18/2014  . Dysmenorrhea 10/18/2014  . Female infertility  10/18/2014  . Gastro-esophageal reflux disease without esophagitis 10/18/2014  . Allergy, food 10/18/2014  . Hodgkin lymphoma, unspecified, unspecified site ([place]) 10/18/2014  . H/O thyroidectomy 10/18/2014  . Metabolic syndrome 32/95/1884  . Extreme obesity 10/18/2014  . Low parathyroid level after removal 10/18/2014  . Vitamin D deficiency 10/18/2014  . Hive 10/18/2014  . Dermatitis seborrheica 10/18/2014  . Arthralgia of temporomandibular joint 10/18/2014  . Post-surgical hypothyroidism 10/13/2012    Allergies  Allergen Reactions  . Tape Rash    Uncoded Allergy. Allergen: Msg  . Barium-Containing Compounds Hives    Oral contrast  . Monosodium Glutamate     Unknown.    Past Surgical History:  Procedure Laterality Date  . lymphonodi biopsy    . PORT-A-CATH REMOVAL  2004  . PORTACATH PLACEMENT  11-24-12  . THYROID SURGERY    . THYROIDECTOMY  10-13-12  . uterine poly removal Left 08/2010   [facility]    Social History   Tobacco Use  . Smoking status: Former Smoker    Packs/day: 1.00    Years: 0.00    Pack years: 0.00    Types: Cigarettes    Quit date: 07/27/2009    Years since quitting: 10.0  . Smokeless tobacco: Never Used  Substance Use Topics  . Alcohol use: Yes    Alcohol/week: 0.0 standard drinks    Comment: rarely  . Drug use: No     Medication list has been reviewed and updated.  Current Meds  Medication Sig  . calcitRIOL (ROCALTROL) 0.5 MCG capsule   . EPINEPHrine 0.3 mg/0.3 mL IJ SOAJ injection   . fexofenadine (ALLEGRA) 180 MG tablet Take by mouth.  . hydrOXYzine (ATARAX/VISTARIL) 25 MG tablet Take 1 tablet (25 mg total) by mouth every 6 (six) hours as needed.  Marland Kitchen omeprazole (PRILOSEC) 20 MG capsule Take 1 capsule (20 mg total) by mouth daily.  Marland Kitchen thyroid (NP THYROID) 120 MG tablet Take 120 mg by mouth daily before breakfast. 1 daily    PHQ 2/9 Scores 08/12/2019 07/26/2016 04/19/2016 01/26/2016  PHQ - 2 Score 0 2 1 1   PHQ- 9 Score 1 5 - -  Exception  Documentation - - - -  Not completed - - - -    GAD 7 : Generalized Anxiety Score 08/12/2019  Nervous, Anxious, on Edge 1  Control/stop worrying 2  Worry too much - different things 2  Trouble relaxing 1  Restless 0  Easily annoyed or irritable 1  Afraid - awful might happen 0  Total GAD 7 Score 7  Anxiety Difficulty Not difficult at all    BP Readings from Last 3 Encounters:  08/12/19 140/70  07/26/16 114/68  07/25/16 112/71    Physical Exam Vitals and nursing note reviewed.  Constitutional:      General: She is not in acute distress.    Appearance: She is not diaphoretic.  HENT:     Head: Normocephalic and atraumatic.     Right Ear: External ear normal.     Left Ear: External ear normal.     Nose: Nose normal.  Eyes:     General:        Right eye: No discharge.        Left eye: No discharge.     Conjunctiva/sclera: Conjunctivae normal.     Pupils: Pupils are equal, round, and reactive to light.  Neck:     Thyroid: No thyromegaly.     Vascular: No JVD.  Cardiovascular:     Rate  and Rhythm: Normal rate and regular rhythm.     Heart sounds: Normal heart sounds. No murmur heard.  No friction rub. No gallop.   Pulmonary:     Effort: Pulmonary effort is normal.     Breath sounds: Normal breath sounds.  Abdominal:     General: Bowel sounds are normal.     Palpations: Abdomen is soft. There is no mass.     Tenderness: There is no abdominal tenderness. There is no guarding.  Musculoskeletal:        General: Normal range of motion.     Cervical back: Normal range of motion and neck supple.  Lymphadenopathy:     Cervical: No cervical adenopathy.  Skin:    General: Skin is warm and dry.  Neurological:     Mental Status: She is alert.     Deep Tendon Reflexes: Reflexes are normal and symmetric.     Wt Readings from Last 3 Encounters:  08/12/19 240 lb (108.9 kg)  07/26/16 228 lb 6.4 oz (103.6 kg)  07/25/16 227 lb 2 oz (103 kg)    BP 140/70   Pulse 72   Ht 5'  6" (1.676 m)   Wt 240 lb (108.9 kg)   LMP 07/27/2019 (Approximate)   BMI 38.74 kg/m   Assessment and Plan:  1. Establishing care with new doctor, encounter for Patient establishing care with new physician.  Review of patient's previous encounters, most recent labs most recent imaging and care everywhere reviewed.  2. Major depression, recurrent, chronic (HCC) Chronic.  Uncontrolled.  Stable relatively.  Needs to be on medication for depression.  Patient used to be on SNRI.  We will restart on sertraline at 25 mg one half of a 50 mg. - sertraline (ZOLOFT) 25 MG tablet; Take one half tablet (50 mg total) by mouth daily.  We will recheck in 4 weeks.  Dispense: 30 tablet; Refill: 3  3. Post-surgical hypothyroidism Patient has had hypothyroid secondary to fibroidectomy several years ago.  She has been on from 112 mcg of levothyroxine to most recently 200 mg of [place] thyroid.  We will start at 150 mcg and check TSH. - levothyroxine (SYNTHROID) 150 MCG tablet; Take 1 tablet (150 mcg total) by mouth daily.  Dispense: 90 tablet; Refill: 3 - TSH  4. Low parathyroid level after removal Patient had the majority of her parathyroids removed except for one little area that had scar tissue from radiation therapy for Hodgkin's.  We will check a comprehensive metabolic panel to assess her calcium and phosphorus. - Comprehensive metabolic panel  5. Hypocalcemia As noted patient's had serum calciums in the 7-8.5 range.  This is probably secondary to low parathyroid hormone level.  Pending evaluation we may be involving endocrinology. - Comprehensive metabolic panel  6. GAD (generalized anxiety disorder) Review of patient's symptoms notes that there is some level of anxiety as noted with the depression earlier.  For this reason we are initiating at 25 mg of sertraline.  7. Dermatitis seborrheica Patient has been followed by Dr. Phillip Heal for seborrheic dermatitis.  I have suggested that she pick up Nizoral  over-the-counter for evaluation.  8. Microcytic anemia Chronic.  Uncontrolled.  Noted that her hemoglobin has been low the last couple evaluations with microcytic indices.  This is consistent with low iron and we will recheck a CBC and adjust accordingly likely with ferrous sulfate 325 once a day. - CBC with Differential/Platelet  9. Hodgkin lymphoma of lymph nodes of multiple regions, unspecified Hodgkin  lymphoma type [facility]) Patient has a history of Hodgkin's lymphoma about 5 to 6 years ago this is a recurrence from her previous diagnosis of Hodgkin's when she was younger.  Patient has not seen oncology for several years due to the "following between the cracks ".  She used to see Dr. Grayland Ormond several years ago but would like to see locally and [place] and we will place a referral to Dr. Mike Gip in the meantime in addition to her CBC/CMP/TSH I will be obtaining an LDH. - Lactate Dehydrogenase (LDH) - Ambulatory referral to Oncology  10. Chronic urticaria Patient's had chronic urticaria and has noted that this occurs after eating red meat.  I suspect there may be an alpha gal concern here and we will check this level. - Alpha Gal IgE

## 2019-08-18 ENCOUNTER — Other Ambulatory Visit: Payer: Self-pay

## 2019-08-18 DIAGNOSIS — E890 Postprocedural hypothyroidism: Secondary | ICD-10-CM

## 2019-08-18 DIAGNOSIS — D509 Iron deficiency anemia, unspecified: Secondary | ICD-10-CM

## 2019-08-18 LAB — COMPREHENSIVE METABOLIC PANEL
ALT: 12 IU/L (ref 0–32)
AST: 12 IU/L (ref 0–40)
Albumin/Globulin Ratio: 2 (ref 1.2–2.2)
Albumin: 4.7 g/dL (ref 3.8–4.8)
Alkaline Phosphatase: 68 IU/L (ref 48–121)
BUN/Creatinine Ratio: 17 (ref 9–23)
BUN: 12 mg/dL (ref 6–20)
Bilirubin Total: <0.2 mg/dL (ref 0.0–1.2)
CO2: 27 mmol/L (ref 20–29)
Calcium: 8.1 mg/dL — ABNORMAL LOW (ref 8.7–10.2)
Chloride: 99 mmol/L (ref 96–106)
Creatinine, Ser: 0.7 mg/dL (ref 0.57–1.00)
GFR calc Af Amer: 128 mL/min/{1.73_m2} (ref >59)
GFR calc non Af Amer: 111 mL/min/{1.73_m2} (ref >59)
Globulin, Total: 2.4 g/dL (ref 1.5–4.5)
Glucose: 88 mg/dL (ref 65–99)
Potassium: 4.1 mmol/L (ref 3.5–5.2)
Sodium: 141 mmol/L (ref 134–144)
Total Protein: 7.1 g/dL (ref 6.0–8.5)

## 2019-08-18 LAB — CBC WITH DIFFERENTIAL/PLATELET
Basophils Absolute: 0.1 10*3/uL (ref 0.0–0.2)
Basos: 1 %
EOS (ABSOLUTE): 0.1 10*3/uL (ref 0.0–0.4)
Eos: 1 %
Hematocrit: 33.7 % — ABNORMAL LOW (ref 34.0–46.6)
Hemoglobin: 10 g/dL — ABNORMAL LOW (ref 11.1–15.9)
Immature Grans (Abs): 0 10*3/uL (ref 0.0–0.1)
Immature Granulocytes: 0 %
Lymphocytes Absolute: 2.7 10*3/uL (ref 0.7–3.1)
Lymphs: 23 %
MCH: 19.6 pg — ABNORMAL LOW (ref 26.6–33.0)
MCHC: 29.7 g/dL — ABNORMAL LOW (ref 31.5–35.7)
MCV: 66 fL — ABNORMAL LOW (ref 79–97)
Monocytes Absolute: 1 10*3/uL — ABNORMAL HIGH (ref 0.1–0.9)
Monocytes: 8 %
Neutrophils Absolute: 7.8 10*3/uL — ABNORMAL HIGH (ref 1.4–7.0)
Neutrophils: 67 %
Platelets: 385 10*3/uL (ref 150–450)
RBC: 5.09 x10E6/uL (ref 3.77–5.28)
RDW: 16.9 % — ABNORMAL HIGH (ref 11.7–15.4)
WBC: 11.8 10*3/uL — ABNORMAL HIGH (ref 3.4–10.8)

## 2019-08-18 LAB — TSH: TSH: 40.5 u[IU]/mL — ABNORMAL HIGH (ref 0.450–4.500)

## 2019-08-18 LAB — ALPHA GAL IGE: Alpha Gal IgE*: 13.2 kU/L — ABNORMAL HIGH (ref <0.10)

## 2019-08-18 LAB — LACTATE DEHYDROGENASE: LDH: 180 IU/L (ref 119–226)

## 2019-08-18 MED ORDER — FERROUS SULFATE 325 (65 FE) MG PO TBEC: 325.0000 mg | DELAYED_RELEASE_TABLET | Freq: Every morning | ORAL | 0 refills | Status: DC

## 2019-08-18 MED ORDER — LEVOTHYROXINE SODIUM 175 MCG PO TABS: 175.0000 ug | ORAL_TABLET | Freq: Every day | ORAL | 1 refills | Status: DC

## 2019-08-18 NOTE — Progress Notes (Signed)
Put in ferrous sulfate otc and sent levothyroxine 175 to pharmacy

## 2019-08-30 NOTE — Progress Notes (Signed)
[facility]  [address], [address] [place], [place] [address] Phone: [phone]  Fax: [phone]   Clinic Day:  08/31/2019  Referring physician: Juline Patch, MD  Chief Complaint: Jo [name] is a 39 y.o. female with a history of recurrent Hodgkin's disease who is referred for new patient assessment by Dr. Otilio Miu for assessment and management.   HPI: The patient was diagnosed with Hodgkin's disease in 2001 when she was 39 years old.  She had stage IIA disease (left neck and chest involvement only).  Notes from Dr Oliva Bustard indicate she received ABVD chemotherapy.  She received modified mantle radiation.  She remained in remission until 2014.   She presented with a large multinodular goiter s/p multiple biopsies and high right cervical adenopathy in 2014.  She underwent total thyroidectomy and right neck lymph node excisional biopsy on 10/13/2012 by Dr Tami Ribas.  Pathology revealed recurrent classical Hodgkin's lymphoma.   Thyroid revealed multinodular hyperplasia with degenerative alterations and one parathyroid.  She received 2 cycles of BEACOPP chemotherapy beginning on 12/01/2012. She completed involved field radiation in 03/2013 under Dr. Berton Mount.  She received 3000 cGy to involve field including head and neck and upper supraclavicular nodes over 3 weeks.  PET scan in 08/2014 was negative.   She was seen previously by Dr Oliva Bustard and Dr. Grayland Ormond.  She was lost to follow up. PET scan on 09/01/2015 revealed no evidence of recurrence.  Recommendation was for yearly CT scans for observation.  Last imaging occurred in 07/2016.  Neck, chest, abdomen and pelvis CT on 07/25/2016 revealed no cervical lymphadenopathy or evidence of active lymphoma in the neck.  There were several subcentimeter nodes minimally increased since 2015, and attention to these was directed on follow-up.  There was incidental chronic ectopic thyroid parenchyma along the course of the  thyroglossal duct.  There were several small sclerotic lesions in the T1 and T8 vertebral, not changed from 2015, and not recently hypermetabolic, c/w benign lesions or conceivably successfully treated bonymanifestation of lymphoma.  There was new peripheral triangular sub solid density in the left lower lobe, inflammatory etiology favored, merit surveillance on follow up studies. There was a 4 mm sub solid lesion in the left lower lobe which was new.  There was esophageal dysmotility vs gastroesophageal reflux.  There was thoracic spondylosis along with degenerative disc disease at L5-S1.  There was a stable right adrenal mass, not hypermetabolic on recent PET-CT, probably an adenoma or similar benign lesion.  There was a small mass along the right side of lower uterine segment, likely a fibroid.  She met with Dr. Ronnald Ramp for a new patient evaluation.  She has hypothyroidism an hypoparathyroidism s/p thyroidectomy. She has hypocalcemia and iron deficiency anemia.  Symptoms included dry skin, cold intolerance, constipation, depressed mood and weight gain.  She was started on sertraline, Synthroid 150 mcg a day and ferrous sulfate 325 mg once a day.  She was referred to oncology.   Labs on 08/12/2019 included a hematocrit of 33.7, hemoglobin 10.0, MCV 66, platelets 385,000, WBC 11,800 with an ANC of 7800.  Creatinine was 0.7, calcium 8.1, albumin 4.7 with normal liver function tests.   LDH was 180.  TSH was 40.5.  Review of prior CBCs reveals a normal MCV in 2014 and 2015.  Symptomatically, she denies any fevers, sweats, unintentional weight loss, bruising, adenopathy, or recurrent infections.  She feels anxious being in a cancer center today.   She continues to regularly menstruate,  generally 5 to 7 days, in which she will go through 3-4 pads per day on the heaviest days. She is currently working with a nutritionalist to help her lose weight and work with her alpha-gal syndrome.  She started taking oral iron in  the last 2 weeks.   Last mammogram was on 07/13/2014.  There was no evidence of malignancy.  She does not have her COVID-19 vaccine and does not plan to get vaccinated at this time.    Past Medical History:  Diagnosis Date  . Anxiety   . Constipation   . Cramp of limb   . Depression   . GERD (gastroesophageal reflux disease)   . Hives   . Hodgkin disease ([place]) 2001 and 2014   chemo and rad tx in 2001 and 2014  . Hodgkin's disease in remission ([place])   . Hyperlipidemia   . Insomnia   . Lymphoma, Hodgkin's ([place])    2001, 2014  . Migraine   . Obesity   . Oligomenorrhea   . Primary female infertility   . Screening mammogram for high-risk patient   . Seborrhea   . Snoring   . Thyroid disease   . TMJ (dislocation of temporomandibular joint)   . Vitamin D deficiency     Past Surgical History:  Procedure Laterality Date  . lymphonodi biopsy    . PORT-A-CATH REMOVAL  2004  . PORTACATH PLACEMENT  11-24-12  . THYROID SURGERY    . THYROIDECTOMY  10-13-12  . uterine poly removal Left 08/2010   [facility]    Family History  Problem Relation Age of Onset  . Hypertension Mother   . Liver disease Mother   . Diabetes Father   . Heart disease Father   . CAD Father   . Sudden Cardiac Death Father   . Diabetes Brother   . Breast cancer Paternal Aunt        great aunt >50  . Breast cancer Cousin   . CAD Maternal Grandmother   . Parkinson's disease Maternal Grandmother   . Parkinson's disease Maternal Grandfather   . CAD Paternal Grandmother     Social History:  reports that she quit smoking about 10 years ago. Her smoking use included cigarettes. She smoked 1.00 pack per day for 0.00 years. She has never used smokeless tobacco. She reports current alcohol use. She reports that she does not use drugs. She lives in [place]. The patient is alone today.   Allergies:  Allergies  Allergen Reactions  . Other Rash and Other (See Comments)    Contrast dye Uncoded Allergy. Allergen:  Msg Contrast dye  . Tape Rash    Uncoded Allergy. Allergen: Msg  . Barium-Containing Compounds Hives    Oral contrast  . Monosodium Glutamate     Unknown.    Current Medications: Current Outpatient Medications  Medication Sig Dispense Refill  . Ascorbic Acid (VITAMIN C PO) Take by mouth.    . calcitRIOL (ROCALTROL) 0.5 MCG capsule     . Calcium Acetate, Phos Binder, (CALCIUM ACETATE PO) Take by mouth.    . ferrous sulfate 325 (65 FE) MG EC tablet Take 1 tablet (325 mg total) by mouth every morning. 30 tablet 0  . fexofenadine (ALLEGRA) 180 MG tablet Take by mouth.    . hydrocortisone cream 0.5 %     . levothyroxine (SYNTHROID) 175 MCG tablet Take 1 tablet (175 mcg total) by mouth daily before breakfast. 30 tablet 1  . Multiple Vitamin (MULTIVITAMIN ADULT PO) Take  by mouth.    Marland Kitchen omeprazole (PRILOSEC OTC) 20 MG tablet Take by mouth.    . sertraline (ZOLOFT) 50 MG tablet Take 1 tablet (50 mg total) by mouth daily. 30 tablet 3  . Betamethasone Dipropionate 0.05 % EMUL Apply topically. (Patient not taking: Reported on 08/31/2019)    . Calcium Citrate 333 MG TABS  (Patient not taking: Reported on 08/31/2019)    . DULoxetine (CYMBALTA) 30 MG capsule Take by mouth. (Patient not taking: Reported on 08/31/2019)    . EPINEPHrine 0.3 mg/0.3 mL IJ SOAJ injection  (Patient not taking: Reported on 08/31/2019)    . hydrOXYzine (ATARAX/VISTARIL) 25 MG tablet Take 1 tablet (25 mg total) by mouth every 6 (six) hours as needed. (Patient not taking: Reported on 08/31/2019) 30 tablet 0  . mometasone (ELOCON) 0.1 % cream  (Patient not taking: Reported on 08/31/2019)    . omeprazole (PRILOSEC) 20 MG capsule Take 1 capsule (20 mg total) by mouth daily. (Patient not taking: Reported on 08/31/2019) 90 capsule 1  . ondansetron (ZOFRAN-ODT) 4 MG disintegrating tablet  (Patient not taking: Reported on 08/31/2019)    . pimecrolimus (ELIDEL) 1 % cream Apply topically. (Patient not taking: Reported on 08/31/2019)    .  SOOLANTRA 1 % CREA Apply topically. (Patient not taking: Reported on 08/31/2019)     No current facility-administered medications for this visit.    Review of Systems  Constitutional: Positive for weight loss (intentional). Negative for chills, diaphoresis, fever and malaise/fatigue.       Feels "fine".  HENT: Negative.  Negative for congestion, ear pain, nosebleeds and sore throat.   Eyes: Negative.  Negative for blurred vision, double vision, photophobia and pain.  Respiratory: Negative.  Negative for cough, sputum production, shortness of breath and wheezing.   Cardiovascular: Negative.  Negative for chest pain, palpitations and leg swelling.  Gastrointestinal: Negative.  Negative for constipation, diarrhea, heartburn, nausea and vomiting.  Genitourinary: Negative.  Negative for frequency and urgency.  Musculoskeletal: Positive for back pain (lumbar region) and joint pain. Negative for falls and myalgias.  Skin: Negative for rash.       Dermatitis  Neurological: Negative.  Negative for dizziness, sensory change, weakness and headaches.  Endo/Heme/Allergies: Negative.  Does not bruise/bleed easily.  Psychiatric/Behavioral: Negative for depression. The patient is nervous/anxious.    Performance status (ECOG): 0  Vitals Blood pressure 133/77, pulse 87, temperature (!) 96 F (35.6 C), temperature source Tympanic, resp. rate 18, weight 235 lb 14.3 oz (107 kg), SpO2 97 %.   Physical Exam Vitals and nursing note reviewed.  Constitutional:      General: She is not in acute distress.    Appearance: Normal appearance. She is not ill-appearing or diaphoretic.  HENT:     Head: Normocephalic and atraumatic.     Comments: Long hair.    Mouth/Throat:     Mouth: Mucous membranes are moist.     Pharynx: No oropharyngeal exudate or posterior oropharyngeal erythema.  Eyes:     General: No scleral icterus.    Conjunctiva/sclera: Conjunctivae normal.     Pupils: Pupils are equal, round, and  reactive to light.     Comments: Blue eyes.  Cardiovascular:     Rate and Rhythm: Normal rate and regular rhythm.     Pulses: Normal pulses.     Heart sounds: Normal heart sounds.  Pulmonary:     Effort: Pulmonary effort is normal.     Breath sounds: Normal breath sounds. No wheezing, rhonchi or  rales.  Abdominal:     Palpations: Abdomen is soft. There is no hepatomegaly, splenomegaly or mass.     Tenderness: There is no abdominal tenderness. There is no guarding or rebound.  Musculoskeletal:     Cervical back: Normal range of motion and neck supple.     Right lower leg: No edema.     Left lower leg: No edema.  Lymphadenopathy:     Head:     Right side of head: No preauricular, posterior auricular or occipital adenopathy.     Left side of head: No preauricular, posterior auricular or occipital adenopathy.     Cervical: No cervical adenopathy.     Upper Body:     Right upper body: No supraclavicular or axillary adenopathy.     Left upper body: No supraclavicular or axillary adenopathy.     Lower Body: No right inguinal adenopathy. No left inguinal adenopathy.  Skin:    General: Skin is warm and dry.     Findings: No bruising or erythema.  Neurological:     Mental Status: She is alert and oriented to person, place, and time.  Psychiatric:        Mood and Affect: Affect normal. Mood is anxious.        Behavior: Behavior normal.        Thought Content: Thought content normal.        Judgment: Judgment normal.    No visits with results within 3 Day(s) from this visit.  Latest known visit with results is:  Office Visit on 08/12/2019  Component Date Value Ref Range Status  . WBC 08/12/2019 11.8* 3.4 - 10.8 x10E3/uL Final  . RBC 08/12/2019 5.09  3.77 - 5.28 x10E6/uL Final  . Hemoglobin 08/12/2019 10.0* 11.1 - 15.9 g/dL Final  . Hematocrit 08/12/2019 33.7* 34.0 - 46.6 % Final  . MCV 08/12/2019 66* 79 - 97 fL Final  . MCH 08/12/2019 19.6* 26.6 - 33.0 pg Final  . MCHC 08/12/2019  29.7* 31 - 35 g/dL Final  . RDW 08/12/2019 16.9* 11.7 - 15.4 % Final  . Platelets 08/12/2019 385  150 - 450 x10E3/uL Final  . Neutrophils 08/12/2019 67  Not Estab. % Final  . Lymphs 08/12/2019 23  Not Estab. % Final  . Monocytes 08/12/2019 8  Not Estab. % Final  . Eos 08/12/2019 1  Not Estab. % Final  . Basos 08/12/2019 1  Not Estab. % Final  . Neutrophils Absolute 08/12/2019 7.8* 1 - 7 x10E3/uL Final  . Lymphocytes Absolute 08/12/2019 2.7  0 - 3 x10E3/uL Final  . Monocytes Absolute 08/12/2019 1.0* 0 - 0 x10E3/uL Final  . EOS (ABSOLUTE) 08/12/2019 0.1  0.0 - 0.4 x10E3/uL Final  . Basophils Absolute 08/12/2019 0.1  0 - 0 x10E3/uL Final  . Immature Granulocytes 08/12/2019 0  Not Estab. % Final  . Immature Grans (Abs) 08/12/2019 0.0  0.0 - 0.1 x10E3/uL Final  . Glucose 08/12/2019 88  65 - 99 mg/dL Final  . BUN 08/12/2019 12  6 - 20 mg/dL Final  . Creatinine, Ser 08/12/2019 0.70  0.57 - 1.00 mg/dL Final  . GFR calc non Af Amer 08/12/2019 111  >59 mL/min/1.73 Final  . GFR calc Af Amer 08/12/2019 128  >59 mL/min/1.73 Final   Comment: **[facility] currently reports eGFR in compliance with the current**   recommendations of the [employer]. [facility] will   update reporting as new guidelines are published from the NKF-ASN   Task force.   Marland Kitchen  BUN/Creatinine Ratio 08/12/2019 17  9 - 23 Final  . Sodium 08/12/2019 141  134 - 144 mmol/L Final  . Potassium 08/12/2019 4.1  3.5 - 5.2 mmol/L Final  . Chloride 08/12/2019 99  96 - 106 mmol/L Final  . CO2 08/12/2019 27  20 - 29 mmol/L Final  . Calcium 08/12/2019 8.1* 8.7 - 10.2 mg/dL Final  . Total Protein 08/12/2019 7.1  6.0 - 8.5 g/dL Final  . Albumin 08/12/2019 4.7  3.8 - 4.8 g/dL Final  . Globulin, Total 08/12/2019 2.4  1.5 - 4.5 g/dL Final  . Albumin/Globulin Ratio 08/12/2019 2.0  1.2 - 2.2 Final  . Bilirubin Total 08/12/2019 <0.2  0.0 - 1.2 mg/dL Final  . Alkaline Phosphatase 08/12/2019 68  48 - 121 IU/L Final  . AST 08/12/2019 12  0 -  40 IU/L Final  . ALT 08/12/2019 12  0 - 32 IU/L Final  . TSH 08/12/2019 40.500* 0.450 - 4.500 uIU/mL Final  . LDH 08/12/2019 180  119 - 226 IU/L Final  . Alpha Gal IgE* 08/12/2019 13.20* <0.10 kU/L Final   Comment: Previous reports (JACI [id number]) have demonstrated that patients with IgE antibodies to galactose-a-1,3-galactose are at risk for delayed anaphylaxis, angioedema, or urticaria following consumption of beef, pork, or lamb. *This test was developed and its performance characteristics determined by [employer]. It has not been cleared or approved by the U.S. Food and Drug Administration.     Assessment:  Jo [name] is a 39 y.o. female with recurrent Hodgkin's lymphoma. She was initially diagnosed in 2001(age 44).  She had stage IIA disease (left neck and chest involvement only).  She received ABVD chemotherapy and modified mantle radiation.  She remained in remission until 2014.   She presented with a large multinodular goiter s/p multiple biopsies and high right cervical adenopathy in 2014.  She underwent total thyroidectomy and right neck lymph node excisional biopsy on 10/13/2012.  Pathology revealed recurrent classical Hodgkin's lymphoma.   Thyroid revealed multinodular hyperplasia with degenerative alterations and one parathyroid.    She received 2 cycles of BEACOPP chemotherapy beginning on 12/01/2012. She completed involved field radiation in 03/2013.  She received 3000 cGy to head and neck and upper supraclavicular nodes over 3 weeks.  PET scan in 08/2014 and 08/2015 were negative  Neck, chest, abdomen and pelvis CT on 07/25/2016 revealed no cervical lymphadenopathy or evidence of active lymphoma in the neck.  There were several subcentimeter nodes minimally increased since 2015, and attention to these was directed on follow-up.  There was incidental chronic ectopic thyroid parenchyma along the course of the thyroglossal duct.  There were several small  sclerotic lesions in the T1 and T8 vertebral, not changed from 2015, and not recently hypermetabolic, c/w benign lesions or conceivably successfully treated bony manifestation of lymphoma.  There was new peripheral triangular sub solid density in the left lower lobe, inflammatory etiology favored, merit surveillance on follow up studies. There was a 4 mm sub solid lesion in the left lower lobe which was new.  There was esophageal dysmotility vs gastroesophageal reflux.  There was thoracic spondylosis along with degenerative disc disease at L5-S1.  There was a stable right adrenal mass, not hypermetabolic on recent PET-CT, probably an adenoma or similar benign lesion.  There was a small mass along the right side of lower uterine segment, likely a fibroid.  She has hypothyroidism s/p thyroidectomy and hypoparathyroidism with resultant hypocalcemia.     She has iron deficiency anemia.  She  began ferrous sulfate 325 mg a day 2 weeks ago.   Labs on 08/12/2019 included a hematocrit of 33.7, hemoglobin 10.0, MCV 66, platelets 385,000, WBC 11,800 with an ANC of 7800.  Creatinine was 0.7, calcium 8.1, albumin 4.7 with normal liver function tests.   LDH was 180.  TSH was 40.5.  Review of prior CBCs reveals a normal MCV in 2014 and 2015.  Screening mammogram on 07/13/2014 revealed no evidence of malignancy.  She does not have her COVID-19 vaccine and does not plan to get vaccinated at this time.   Symptomatically, she denies any fevers, sweats, unintentional weight loss, bruising, adenopathy, or recurrent infections.  She notes heavy menses.  She denies any melena, hematochezia or hematuria.  Plan: 1.   Recurrent Hodgkin's lymphoma  Review entire medical history, diagnosis and treatment to date.  She was diagnosed with stage IIA Hodgkn's in 2001.   She received ABVD chemotherapy and modified mantle radiation.  She developed right neck recurrence in 2014.   She received 2 cycles of BEACOPP followed by  involved field radiation.  Neck, chest, abdomen, and pelvis CT in 07/2016 revealed   Subcentimeter nodes in the neck that had increased in size.   Sclerotic lesions in T1 and T8.   Subsolid trinagular lesion in LLL.   New subsolid lesion in LLL.  Clinically, she is doing well without B symptoms.  Exam reveals no palpable adenopathy.  Discuss plans for follow-up imaging (recommended by radiology with last imaging). 2.   Long term follow-up  Discuss importance of annual breast screening s/p radiation.  NCCN recommendations are to initiate 8-10 years s/p radiation or at age 73 (whichever comes first).  NCCN Hodgkin Lymphoma Guidelines Panel and the ACS recommends breast MRI in addition to mammography for women who received XRT to the chest between ages 72-30.  Last mammogram was in 2016.  Schedule mammogram.  Anticipate initial breast MRI in 6 months. 3.   Iron deficiency anemia  Hematocrit 33.7.  Hemoglobin 10.0.  MCV 66 on 08/12/2019.  Patient notes heavy menses.  Patient began ferrous sulfate 325 mg a day 2 weeks ago.  Preauth Venofer if needed.  Follow-up CBC and iron studies to ensure correction. 4.   Screening mammogram 09/07/2019. 5.   Neck, chest, abdomen, and pelvis CT 09/07/2019. 6.   RTC in 1 month for MD assessment, labs (CBC with diff, ferritin, iron studies, retic), and review of imaging.  I discussed the assessment and treatment plan with the patient.  The patient was provided an opportunity to ask questions and all were answered.  The patient agreed with the plan and demonstrated an understanding of the instructions.  The patient was advised to call back if the symptoms worsen or if the condition fails to improve as anticipated.   Melissa C. Mike Gip, MD, PhD    08/31/2019, 2:58 PM  I, Jacqualyn Posey, am acting as Education administrator for [employer]. Mike Gip, MD, PhD.  I, Melissa C. Mike Gip, MD, have reviewed the above documentation for accuracy and completeness, and I agree with the  above.

## 2019-08-31 ENCOUNTER — Inpatient Hospital Stay: Payer: BC Managed Care – PPO | Attending: Hematology and Oncology | Admitting: Hematology and Oncology

## 2019-08-31 ENCOUNTER — Other Ambulatory Visit: Payer: Self-pay

## 2019-08-31 ENCOUNTER — Encounter: Payer: Self-pay | Admitting: Hematology and Oncology

## 2019-08-31 ENCOUNTER — Inpatient Hospital Stay: Payer: BC Managed Care – PPO

## 2019-08-31 VITALS — BP 133/77 | HR 87 | Temp 96.0°F | Resp 18 | Wt 235.9 lb

## 2019-08-31 DIAGNOSIS — N920 Excessive and frequent menstruation with regular cycle: Secondary | ICD-10-CM | POA: Diagnosis not present

## 2019-08-31 DIAGNOSIS — Z923 Personal history of irradiation: Secondary | ICD-10-CM | POA: Insufficient documentation

## 2019-08-31 DIAGNOSIS — Z9221 Personal history of antineoplastic chemotherapy: Secondary | ICD-10-CM | POA: Diagnosis not present

## 2019-08-31 DIAGNOSIS — C8190 Hodgkin lymphoma, unspecified, unspecified site: Secondary | ICD-10-CM | POA: Diagnosis not present

## 2019-08-31 DIAGNOSIS — C8198 Hodgkin lymphoma, unspecified, lymph nodes of multiple sites: Secondary | ICD-10-CM

## 2019-08-31 DIAGNOSIS — F419 Anxiety disorder, unspecified: Secondary | ICD-10-CM | POA: Insufficient documentation

## 2019-08-31 DIAGNOSIS — E042 Nontoxic multinodular goiter: Secondary | ICD-10-CM | POA: Insufficient documentation

## 2019-08-31 DIAGNOSIS — D500 Iron deficiency anemia secondary to blood loss (chronic): Secondary | ICD-10-CM | POA: Diagnosis not present

## 2019-08-31 DIAGNOSIS — D509 Iron deficiency anemia, unspecified: Secondary | ICD-10-CM | POA: Insufficient documentation

## 2019-09-03 ENCOUNTER — Telehealth: Payer: Self-pay | Admitting: Hematology and Oncology

## 2019-09-03 NOTE — Telephone Encounter (Signed)
09/03/2019 Called patient and confirmed her upcoming Neck/Chest/Abdomen/Pelvis CT on 7/28 @ 11:30. Informed patient to arrive 15 mins prior, be NPO 4 hours prior and gave instructions on how to pick up oral prep. Reminded patient of Mammogram that is also scheduled for 7/28 @ 1:00, and that she is asked to not wear powder/deodorant and to wear two piece clothing. Confirmed f/u appt with Dr. Loletha Grayer on 8/17 @ 3:15 to review scans  SRW

## 2019-09-08 ENCOUNTER — Ambulatory Visit
Admission: RE | Admit: 2019-09-08 | Discharge: 2019-09-08 | Disposition: A | Discharge status: Home / Self Care | Payer: BC Managed Care – PPO | Source: Ambulatory Visit | Attending: Hematology and Oncology | Admitting: Hematology and Oncology

## 2019-09-08 ENCOUNTER — Other Ambulatory Visit: Payer: Self-pay

## 2019-09-08 DIAGNOSIS — C8198 Hodgkin lymphoma, unspecified, lymph nodes of multiple sites: Secondary | ICD-10-CM | POA: Diagnosis not present

## 2019-09-08 DIAGNOSIS — C8190 Hodgkin lymphoma, unspecified, unspecified site: Secondary | ICD-10-CM | POA: Diagnosis not present

## 2019-09-08 DIAGNOSIS — Z1231 Encounter for screening mammogram for malignant neoplasm of breast: Secondary | ICD-10-CM | POA: Diagnosis not present

## 2019-09-08 HISTORY — DX: Personal history of irradiation: Z92.3

## 2019-09-08 HISTORY — DX: Personal history of antineoplastic chemotherapy: Z92.21

## 2019-09-08 MED ORDER — IOHEXOL 300 MG/ML  SOLN
125.0000 mL | Freq: Once | INTRAMUSCULAR | Status: AC | PRN
  Administered 2019-09-08: 125 mL via INTRAVENOUS

## 2019-09-09 ENCOUNTER — Ambulatory Visit (INDEPENDENT_AMBULATORY_CARE_PROVIDER_SITE_OTHER): Payer: BC Managed Care – PPO | Admitting: Family Medicine

## 2019-09-09 ENCOUNTER — Encounter: Payer: Self-pay | Admitting: Family Medicine

## 2019-09-09 VITALS — BP 122/76 | HR 72 | Ht 66.0 in | Wt 236.0 lb

## 2019-09-09 DIAGNOSIS — C8190 Hodgkin lymphoma, unspecified, unspecified site: Secondary | ICD-10-CM | POA: Diagnosis not present

## 2019-09-09 DIAGNOSIS — F339 Major depressive disorder, recurrent, unspecified: Secondary | ICD-10-CM | POA: Diagnosis not present

## 2019-09-09 DIAGNOSIS — E890 Postprocedural hypothyroidism: Secondary | ICD-10-CM

## 2019-09-09 DIAGNOSIS — Z6838 Body mass index (BMI) 38.0-38.9, adult: Secondary | ICD-10-CM

## 2019-09-09 MED ORDER — SERTRALINE HCL 50 MG PO TABS: 50.0000 mg | ORAL_TABLET | Freq: Every day | ORAL | 1 refills | Status: DC

## 2019-09-09 MED ORDER — CALCITRIOL 0.5 MCG PO CAPS: 0.5000 ug | ORAL_CAPSULE | Freq: Two times a day (BID) | ORAL | 1 refills | Status: DC

## 2019-09-09 NOTE — Progress Notes (Signed)
Date:  09/09/2019   Name:  Jo [name]   DOB:  1980/07/14   MRN:  [id number]   Chief Complaint: Depression (follow up )  Depression        This is a chronic problem.  The current episode started more than 1 year ago.   The onset quality is gradual.   The problem has been gradually improving since onset.  Associated symptoms include fatigue, irritable and restlessness.  Associated symptoms include no decreased concentration, no helplessness, no hopelessness, does not have insomnia, no decreased interest, no appetite change, no body aches, no myalgias, no headaches, no indigestion, not sad and no suicidal ideas.     The symptoms are aggravated by nothing.  Past treatments include SSRIs - Selective serotonin reuptake inhibitors.  Compliance with treatment is variable.  Previous treatment provided moderate relief.   Lab Results  Component Value Date   CREATININE 0.70 08/12/2019   BUN 12 08/12/2019   NA 141 08/12/2019   K 4.1 08/12/2019   CL 99 08/12/2019   CO2 27 08/12/2019   Lab Results  Component Value Date   CHOL 170 04/19/2016   HDL 51 04/19/2016   LDLCALC 96 04/19/2016   TRIG 113 04/19/2016   CHOLHDL 3.3 04/19/2016   Lab Results  Component Value Date   TSH 40.500 (H) 08/12/2019   Lab Results  Component Value Date   HGBA1C 5.4 04/19/2016   Lab Results  Component Value Date   WBC 11.8 (H) 08/12/2019   HGB 10.0 (L) 08/12/2019   HCT 33.7 (L) 08/12/2019   MCV 66 (L) 08/12/2019   PLT 385 08/12/2019   Lab Results  Component Value Date   ALT 12 08/12/2019   AST 12 08/12/2019   ALKPHOS 68 08/12/2019   BILITOT <0.2 08/12/2019     Review of Systems  Constitutional: Positive for fatigue. Negative for appetite change, chills, fever and unexpected weight change.  HENT: Negative for congestion, ear discharge, ear pain, rhinorrhea, sinus pressure, sneezing and sore throat.   Eyes: Negative for photophobia, pain, discharge, redness and itching.  Respiratory: Negative  for cough, shortness of breath, wheezing and stridor.   Gastrointestinal: Negative for abdominal pain, blood in stool, constipation, diarrhea, nausea and vomiting.  Endocrine: Negative for cold intolerance, heat intolerance, polydipsia, polyphagia and polyuria.  Genitourinary: Negative for dysuria, flank pain, frequency, hematuria, menstrual problem, pelvic pain, urgency, vaginal bleeding and vaginal discharge.  Musculoskeletal: Negative for arthralgias, back pain and myalgias.  Skin: Negative for rash.  Allergic/Immunologic: Negative for environmental allergies and food allergies.  Neurological: Negative for dizziness, weakness, light-headedness, numbness and headaches.  Hematological: Negative for adenopathy. Does not bruise/bleed easily.  Psychiatric/Behavioral: Positive for depression. Negative for decreased concentration, dysphoric mood and suicidal ideas. The patient is not nervous/anxious and does not have insomnia.     Patient Active Problem List   Diagnosis Date Noted  . Anxiety 08/31/2019  . Multinodular goiter 08/31/2019  . Iron deficiency anemia 08/31/2019  . Prediabetes 12/06/2016  . Recurrent major depressive disorder, in partial remission ([place]) 12/05/2016  . Tension type headache 04/27/2015  . Hypocalcemia 04/17/2015  . Hypoparathyroidism ([place]) 04/17/2015  . GAD (generalized anxiety disorder) 10/18/2014  . RLS (restless legs syndrome) 10/18/2014  . Insomnia, persistent 10/18/2014  . Migraine without aura and responsive to treatment 10/18/2014  . CN (constipation) 10/18/2014  . Major depression, recurrent, chronic ([place]) 10/18/2014  . Dysmenorrhea 10/18/2014  . Female infertility 10/18/2014  . Gastro-esophageal reflux disease without esophagitis 10/18/2014  .  Allergy, food 10/18/2014  . Hodgkin lymphoma, unspecified, unspecified site ([place]) 10/18/2014  . H/O thyroidectomy 10/18/2014  . Metabolic syndrome 73/53/2992  . Extreme obesity 10/18/2014  . Low parathyroid level  after removal 10/18/2014  . Vitamin D deficiency 10/18/2014  . Hive 10/18/2014  . Dermatitis seborrheica 10/18/2014  . Arthralgia of temporomandibular joint 10/18/2014  . Post-surgical hypothyroidism 10/13/2012    Allergies  Allergen Reactions  . Tape Rash    Uncoded Allergy. Allergen: Msg  . Barium-Containing Compounds Hives    Oral contrast  . Monosodium Glutamate     Unknown.  . Other Rash and Other (See Comments)    Contrast dye Uncoded Allergy. Allergen: Msg Contrast dye  Pt states she has no Iodine or Omni IV contrast reaction. She is only allergic to Barium Oral contrast. ASW 09/08/19     Past Surgical History:  Procedure Laterality Date  . lymphonodi biopsy    . PORT-A-CATH REMOVAL  2004  . PORTACATH PLACEMENT  11-24-12  . THYROID SURGERY    . THYROIDECTOMY  10-13-12  . uterine poly removal Left 08/2010   [facility]    Social History   Tobacco Use  . Smoking status: Former Smoker    Packs/day: 1.00    Years: 0.00    Pack years: 0.00    Types: Cigarettes    Quit date: 07/27/2009    Years since quitting: 10.1  . Smokeless tobacco: Never Used  Substance Use Topics  . Alcohol use: Yes    Alcohol/week: 0.0 standard drinks    Comment: rarely  . Drug use: No     Medication list has been reviewed and updated.  Current Meds  Medication Sig  . Ascorbic Acid (VITAMIN C PO) Take by mouth.  . Betamethasone Dipropionate 0.05 % EMUL Apply topically.   . calcitRIOL (ROCALTROL) 0.5 MCG capsule   . Calcium Citrate 333 MG TABS   . EPINEPHrine 0.3 mg/0.3 mL IJ SOAJ injection   . ferrous sulfate 325 (65 FE) MG EC tablet Take 1 tablet (325 mg total) by mouth every morning.  . fexofenadine (ALLEGRA) 180 MG tablet Take by mouth.  . hydrocortisone cream 0.5 %   . hydrOXYzine (ATARAX/VISTARIL) 25 MG tablet Take 1 tablet (25 mg total) by mouth every 6 (six) hours as needed.  Marland Kitchen levothyroxine (SYNTHROID) 175 MCG tablet Take 1 tablet (175 mcg total) by mouth daily before breakfast.    . Multiple Vitamin (MULTIVITAMIN ADULT PO) Take by mouth.  Marland Kitchen omeprazole (PRILOSEC OTC) 20 MG tablet Take by mouth.  . sertraline (ZOLOFT) 50 MG tablet Take 1 tablet (50 mg total) by mouth daily.  . SOOLANTRA 1 % CREA Apply topically.     PHQ 2/9 Scores 09/09/2019 08/12/2019 07/26/2016 04/19/2016  PHQ - 2 Score 2 0 2 1  PHQ- 9 Score 4 1 5  -  Exception Documentation - - - -  Not completed - - - -    GAD 7 : Generalized Anxiety Score 09/09/2019 08/12/2019  Nervous, Anxious, on Edge 1 1  Control/stop worrying 0 2  Worry too much - different things 0 2  Trouble relaxing 0 1  Restless 0 0  Easily annoyed or irritable 1 1  Afraid - awful might happen 1 0  Total GAD 7 Score 3 7  Anxiety Difficulty Not difficult at all Not difficult at all    BP Readings from Last 3 Encounters:  09/09/19 122/76  08/31/19 133/77  08/12/19 140/70    Physical Exam Vitals and nursing  note reviewed.  Constitutional:      General: She is irritable.     Appearance: She is well-developed.  HENT:     Head: Normocephalic.     Right Ear: Tympanic membrane and external ear normal.     Left Ear: External ear normal.     Nose: Nose normal.     Mouth/Throat:     Mouth: Mucous membranes are moist.  Eyes:     General: Lids are everted, no foreign bodies appreciated. No scleral icterus.       Left eye: No foreign body or hordeolum.     Conjunctiva/sclera: Conjunctivae normal.     Right eye: Right conjunctiva is not injected.     Left eye: Left conjunctiva is not injected.     Pupils: Pupils are equal, round, and reactive to light.  Neck:     Thyroid: No thyromegaly.     Vascular: No JVD.     Trachea: No tracheal deviation.  Cardiovascular:     Rate and Rhythm: Normal rate and regular rhythm.     Heart sounds: Normal heart sounds. No murmur heard.  No friction rub. No gallop.   Pulmonary:     Effort: Pulmonary effort is normal. No respiratory distress.     Breath sounds: Normal breath sounds. No wheezing,  rhonchi or rales.  Abdominal:     General: Bowel sounds are normal.     Palpations: Abdomen is soft. There is no mass.     Tenderness: There is no abdominal tenderness. There is no guarding or rebound.     Hernia: No hernia is present.  Musculoskeletal:        General: No tenderness. Normal range of motion.     Cervical back: Normal range of motion and neck supple.  Lymphadenopathy:     Cervical: No cervical adenopathy.  Skin:    General: Skin is warm.     Findings: No rash.  Neurological:     Mental Status: She is alert and oriented to person, place, and time.     Cranial Nerves: No cranial nerve deficit.     Deep Tendon Reflexes: Reflexes normal.  Psychiatric:        Mood and Affect: Mood is not anxious or depressed.     Wt Readings from Last 3 Encounters:  09/09/19 (!) 236 lb (107 kg)  08/31/19 235 lb 14.3 oz (107 kg)  08/12/19 240 lb (108.9 kg)    BP 122/76   Pulse 72   Ht 5\' 6"  (1.676 m)   Wt (!) 236 lb (107 kg)   LMP 08/25/2019   BMI 38.09 kg/m   Assessment and Plan:  1. Major depression, recurrent, chronic (HCC) Chronic.  Controlled.  Stable.  Current PHQ is 4 with a gad score of 3.  We will continue sertraline 50 mg once a day.  Will recheck in 6 months. - sertraline (ZOLOFT) 50 MG tablet; Take 1 tablet (50 mg total) by mouth daily.  Dispense: 90 tablet; Refill: 1  2. Low parathyroid level after removal Chronic.  Uncontrolled.  Relatively stable.  We will continue patient's Calcitrol 0.5 mg twice a day.  We will recheck patient's calcium and phosphorus upon return visit.  3. Hodgkin lymphoma, unspecified Hodgkin lymphoma type, unspecified body region ([place]) Chronic.  Controlled.  Currently stable.  Follow-up with Dr. Mike Gip and patient is pleased that this is now being followed on a annual basis given that she not only had an initial occurrence but she has  had a recurrence in the past.  Review of lab work confirms stable circumstances at this time - calcitRIOL  (ROCALTROL) 0.5 MCG capsule; Take 1 capsule (0.5 mcg total) by mouth 2 (two) times daily.  Dispense: 180 capsule; Refill: 1  4.BMI 38 Health risks of being over weight were discussed and patient was counseled on weight loss options and exercise.  Patient's been encouraged to continue to lose weight.

## 2019-09-23 ENCOUNTER — Other Ambulatory Visit: Payer: Self-pay

## 2019-09-23 DIAGNOSIS — C8198 Hodgkin lymphoma, unspecified, lymph nodes of multiple sites: Secondary | ICD-10-CM

## 2019-09-27 NOTE — Progress Notes (Signed)
[facility]  [address] [place], [place] [address] Phone: [phone]  Fax: [phone]   Clinic Day:  09/28/2019  Referring physician: Juline Patch, MD  Chief Complaint: Jo [name] is a 39 y.o. female with a history of recurrent Hodgkin's disease who is seen for review of imaging and discussion regarding direction of therapy.  HPI: The patient was last seen in the oncology clinic on 08/31/2019 for new patient assessment. At that time, she denied any fevers, sweats, unintentional weight loss, bruising, adenopathy, or recurrent infections.  She noted heavy menses.  She denied any melena, hematochezia or hematuria.  Chest, abdomen, and pelvis CT with contrast on 09/08/2019 revealed no evidence of recurrent lymphoma. There was a chronically stable right adrenal nodule, indicative of an adenoma. There was coronary artery calcification.  Soft tissue neck CT with contrast on 09/08/2019 revealed no cervical lymphadenopathy. There was a previous thyroidectomy. There was a 9-10 mm nodule of residual ectopic thyroid in the midline just anterior to the inferior margin of the thyroid cartilage.  Screening mammogram on 09/08/2019 revealed no evidence of malignancy.  During the interim, she has been fine. She reports acid reflux. She has poison oak on her neck right now from an exposure with puppies. Her diet is improving because she is working with a nutritionist. Before that, she ate a lot of junk food. She has alpha gal syndrome and red meat causes her to break out in hives.  She denies ice pica. She started taking oral iron BID with Vitamin C 1-2 months ago. She has no family history of colon cancer.  The patient agrees to a breast MRI in 6 months.   Past Medical History:  Diagnosis Date  . Anxiety   . Constipation   . Cramp of limb   . Depression   . GERD (gastroesophageal reflux disease)   . Hives   . Hodgkin disease ([place]) 2001 and 2014    chemo and rad tx in 2001 and 2014  . Hodgkin's disease in remission ([place])   . Hyperlipidemia   . Insomnia   . Lymphoma, Hodgkin's ([place])    2001, 2014  . Migraine   . Obesity   . Oligomenorrhea   . Personal history of chemotherapy   . Personal history of radiation therapy   . Primary female infertility   . Screening mammogram for high-risk patient   . Seborrhea   . Snoring   . Thyroid disease   . TMJ (dislocation of temporomandibular joint)   . Vitamin D deficiency     Past Surgical History:  Procedure Laterality Date  . lymphonodi biopsy    . PORT-A-CATH REMOVAL  2004  . PORTACATH PLACEMENT  11-24-12  . THYROID SURGERY    . THYROIDECTOMY  10-13-12  . uterine poly removal Left 08/2010   [facility]    Family History  Problem Relation Age of Onset  . Hypertension Mother   . Liver disease Mother   . Cancer Mother        breast cancer, cervical cancer  . Breast cancer Mother 44  . Diabetes Father   . Heart disease Father   . CAD Father   . Sudden Cardiac Death Father   . Diabetes Brother   . Breast cancer Paternal Aunt        great aunt >50  . Breast cancer Cousin   . CAD Maternal Grandmother   . Parkinson's disease Maternal Grandmother   . Parkinson's disease Maternal  Grandfather   . CAD Paternal Grandmother   . Cancer Maternal Aunt        Breast Cancer    Social History:  reports that she quit smoking about 10 years ago. Her smoking use included cigarettes. She smoked 1.00 pack per day for 0.00 years. She has never used smokeless tobacco. She reports current alcohol use. She reports that she does not use drugs. She lives in [place]. The patient is alone today.   Allergies:  Allergies  Allergen Reactions  . Tape Rash    Uncoded Allergy. Allergen: Msg  . Barium-Containing Compounds Hives    Oral contrast  . Monosodium Glutamate     Unknown.  . Other Rash and Other (See Comments)    Contrast dye Uncoded Allergy. Allergen: Msg Contrast dye  Pt states she has no  Iodine or Omni IV contrast reaction. She is only allergic to Barium Oral contrast. ASW 09/08/19     Current Medications: Current Outpatient Medications  Medication Sig Dispense Refill  . Ascorbic Acid (VITAMIN C PO) Take by mouth.    . Betamethasone Dipropionate 0.05 % EMUL Apply topically.     . calcitRIOL (ROCALTROL) 0.5 MCG capsule Take 1 capsule (0.5 mcg total) by mouth 2 (two) times daily. 180 capsule 1  . Calcium Citrate 333 MG TABS     . ferrous sulfate 325 (65 FE) MG EC tablet Take 1 tablet (325 mg total) by mouth every morning. 30 tablet 0  . fexofenadine (ALLEGRA) 180 MG tablet Take by mouth.    . hydrocortisone cream 0.5 %     . hydrOXYzine (ATARAX/VISTARIL) 25 MG tablet Take 1 tablet (25 mg total) by mouth every 6 (six) hours as needed. 30 tablet 0  . levothyroxine (SYNTHROID) 175 MCG tablet Take 1 tablet (175 mcg total) by mouth daily before breakfast. 30 tablet 1  . Multiple Vitamin (MULTIVITAMIN ADULT PO) Take by mouth.    Marland Kitchen omeprazole (PRILOSEC OTC) 20 MG tablet Take by mouth.    . sertraline (ZOLOFT) 50 MG tablet Take 1 tablet (50 mg total) by mouth daily. 90 tablet 1  . SOOLANTRA 1 % CREA Apply topically.     Marland Kitchen EPINEPHrine 0.3 mg/0.3 mL IJ SOAJ injection  (Patient not taking: Reported on 09/28/2019)     No current facility-administered medications for this visit.    Review of Systems  Constitutional: Positive for weight loss (4 lbs). Negative for chills, diaphoresis, fever and malaise/fatigue.       Feels "fine".  HENT: Negative.  Negative for congestion, ear discharge, ear pain, hearing loss, nosebleeds, sinus pain, sore throat and tinnitus.   Eyes: Negative.  Negative for blurred vision, double vision, photophobia and pain.  Respiratory: Negative.  Negative for cough, hemoptysis, sputum production, shortness of breath and stridor.   Cardiovascular: Negative.  Negative for chest pain, palpitations and leg swelling.  Gastrointestinal: Positive for heartburn. Negative  for constipation, diarrhea, nausea and vomiting.       Alpha-gal syndrome. Denies ice pica.  Genitourinary: Negative.  Negative for dysuria, frequency, hematuria and urgency.  Musculoskeletal: Negative for back pain (lumbar region), joint pain, myalgias and neck pain.  Skin: Positive for rash (Poison oak). Negative for itching.  Neurological: Negative.  Negative for dizziness, tingling, sensory change, weakness and headaches.  Endo/Heme/Allergies: Negative.  Does not bruise/bleed easily.  Psychiatric/Behavioral: Negative for depression and memory loss. The patient is not nervous/anxious and does not have insomnia.   All other systems reviewed and are negative.  Performance  status (ECOG): 1  Vitals Blood pressure 117/72, pulse 88, temperature 98.6 F (37 C), temperature source Tympanic, resp. rate 18, weight 231 lb 7.7 oz (105 kg), SpO2 98 %.   Physical Exam Vitals and nursing note reviewed.  Constitutional:      General: She is not in acute distress.    Appearance: Normal appearance. She is not ill-appearing or diaphoretic.  HENT:     Head: Normocephalic and atraumatic.     Comments: Long hair. Eyes:     General: No scleral icterus.    Conjunctiva/sclera: Conjunctivae normal.     Comments: Blue eyes.  Neurological:     Mental Status: She is alert and oriented to person, place, and time.  Psychiatric:        Mood and Affect: Affect normal.        Behavior: Behavior normal.        Thought Content: Thought content normal.        Judgment: Judgment normal.    Appointment on 09/28/2019  Component Date Value Ref Range Status  . Retic Ct Pct 09/28/2019 1.3  0.4 - 3.1 % Final  . RBC. 09/28/2019 5.36* 3.87 - 5.11 MIL/uL Final  . Retic Count, Absolute 09/28/2019 69.7  19.0 - 186.0 K/uL Final  . Immature Retic Fract 09/28/2019 12.2  2.3 - 15.9 % Final   Performed at [facility], [address], [place], [place] [address]  . Iron 09/28/2019 36  28 - 170 ug/dL Final  . TIBC  09/28/2019 389  250 - 450 ug/dL Final  . Saturation Ratios 09/28/2019 9* 10.4 - 31.8 % Final  . UIBC 09/28/2019 353  ug/dL Final   Performed at [facility], [address] [place], [place] [address]  . WBC 09/28/2019 11.0* 4.0 - 10.5 K/uL Final  . RBC 09/28/2019 5.57* 3.87 - 5.11 MIL/uL Final  . Hemoglobin 09/28/2019 11.6* 12.0 - 15.0 g/dL Final  . HCT 09/28/2019 37.9  36 - 46 % Final  . MCV 09/28/2019 68.0* 80.0 - 100.0 fL Final  . MCH 09/28/2019 20.8* 26.0 - 34.0 pg Final  . MCHC 09/28/2019 30.6  30.0 - 36.0 g/dL Final  . RDW 09/28/2019 21.7* 11.5 - 15.5 % Final  . Platelets 09/28/2019 373  150 - 400 K/uL Final  . nRBC 09/28/2019 0.0  0.0 - 0.2 % Final  . Neutrophils Relative % 09/28/2019 66  % Final  . Neutro Abs 09/28/2019 7.4  1.7 - 7.7 K/uL Final  . Lymphocytes Relative 09/28/2019 24  % Final  . Lymphs Abs 09/28/2019 2.6  0.7 - 4.0 K/uL Final  . Monocytes Relative 09/28/2019 7  % Final  . Monocytes Absolute 09/28/2019 0.7  0 - 1 K/uL Final  . Eosinophils Relative 09/28/2019 2  % Final  . Eosinophils Absolute 09/28/2019 0.2  0 - 0 K/uL Final  . Basophils Relative 09/28/2019 1  % Final  . Basophils Absolute 09/28/2019 0.1  0 - 0 K/uL Final  . Immature Granulocytes 09/28/2019 0  % Final  . Abs Immature Granulocytes 09/28/2019 0.04  0.00 - 0.07 K/uL Final   Performed at [facility], [address] [place], [place] [address]  . Ferritin 09/28/2019 13  11 - 307 ng/mL Final   Performed at [facility], [address] [place], [place] [address]    Assessment:  OTA EBERSOLE is a 39 y.o. female with recurrent Hodgkin's lymphoma. She was initially diagnosed in 2001(age 18).  She had  stage IIA disease (left neck and chest involvement only).  She received ABVD chemotherapy and modified mantle radiation.  She remained in remission until 2014.   She presented with a large multinodular goiter s/p multiple biopsies and high right cervical adenopathy in  2014.  She underwent total thyroidectomy and right neck lymph node excisional biopsy on 10/13/2012.  Pathology revealed recurrent classical Hodgkin's lymphoma.   Thyroid revealed multinodular hyperplasia with degenerative alterations and one parathyroid.    She received 2 cycles of BEACOPP chemotherapy beginning on 12/01/2012. She completed involved field radiation in 03/2013.  She received 3000 cGy to head and neck and upper supraclavicular nodes over 3 weeks.  PET scan in 08/2014 and 08/2015 were negative  Neck, chest, abdomen and pelvis CT on 07/25/2016 revealed no cervical lymphadenopathy or evidence of active lymphoma in the neck.  There were several subcentimeter nodes minimally increased since 2015, and attention to these was directed on follow-up.  There was incidental chronic ectopic thyroid parenchyma along the course of the thyroglossal duct.  There were several small sclerotic lesions in the T1 and T8 vertebral, not changed from 2015, and not recently hypermetabolic, c/w benign lesions or conceivably successfully treated bony manifestation of lymphoma.  There was new peripheral triangular sub solid density in the left lower lobe, inflammatory etiology favored, merit surveillance on follow up studies. There was a 4 mm sub solid lesion in the left lower lobe which was new.  There was esophageal dysmotility vs gastroesophageal reflux.  There was thoracic spondylosis along with degenerative disc disease at L5-S1.  There was a stable right adrenal mass, not hypermetabolic on recent PET-CT, probably an adenoma or similar benign lesion.  There was a small mass along the right side of lower uterine segment, likely a fibroid.  Chest, abdomen, and pelvis CT with contrast on 09/08/2019 revealed no evidence of recurrent lymphoma. There was a chronically stable right adrenal nodule, indicative of an adenoma. There was coronary artery calcification.  Soft tissue neck CT with contrast on 09/08/2019 revealed no  cervical lymphadenopathy. There was a previous thyroidectomy. There was a 9-10 mm nodule of residual ectopic thyroid in the midline just anterior to the inferior margin of the thyroid cartilage.  She has hypothyroidism s/p thyroidectomy and hypoparathyroidism with resultant hypocalcemia.     She has iron deficiency anemia.  She began ferrous sulfate 325 mg a day 2 weeks ago.   Labs on 08/12/2019 included a hematocrit of 33.7, hemoglobin 10.0, MCV 66, platelets 385,000, WBC 11,800 with an ANC of 7800.  Creatinine was 0.7, calcium 8.1, albumin 4.7 with normal liver function tests.   LDH was 180.  TSH was 40.5.  Review of prior CBCs reveals a normal MCV in 2014 and 2015.  Screening mammogram on 09/08/2019 revealed no evidence of malignancy.  She does not have her COVID-19 vaccine and does not plan to get vaccinated at this time.   Symptomatically, she is doing well.  She denies any B symptoms.  She has a poison ivy rash.  Plan: 1.   Labs today: CBC with diff, ferritin, iron studies, retic. 2.   Recurrent Hodgkin's lymphoma  She was diagnosed with stage IIA Hodgkn's in 2001.   She received ABVD chemotherapy and modified mantle radiation.  She developed right neck recurrence in 2014.   She received 2 cycles of BEACOPP followed by involved field radiation.  Neck, chest, abdomen, and pelvis CT in 07/2016 revealed   Subcentimeter nodes in the neck, sclerotic lesions in T1  and T8, a subsolid trinagular lesion in LLL and a new subsolid lesion in LLL.  Soft tissue neck, chest, abdomen and pelvis CT scan on 09/08/2019 were personally reviewed.  Agree with radiology findings.   There is no evidence of recurrent disease.   There is a 9-10 mm nodule of residual ectopic thyroid in the midline just anterior to the inferior margin of the thyroid cartilage.  Clinically, she is doing well.  She denies any B symptoms..  Exam reveals no adenopathy or hepatosplenomegaly.  Continue surveillance 2.   Long term  follow-up  Review importance of annual breast screening s/p radiation.  NCCN recommendations are to initiate 8-10 years s/p radiation or at age 52 (whichever comes first).  NCCN Hodgkin Lymphoma Guidelines Panel and the ACS recommends breast MRI in addition to mammography for:   Women who received XRT to the chest between ages 51-30.  Review screening mammogram on 09/08/2019-negative for malignancy.  Encourage follow-up breast MRI every 6 months alternating with mammogram.  Patient agrees..  Bilateral breast MRI on 03/10/2020. 3.   Iron deficiency anemia  Hematocrit 33.7.  Hemoglobin 10.0.  MCV 66 on 08/12/2019.  Hematocrit 37.9.  Hemoglobin 11.6.  MCV 68 on 09/28/2019.  Ferritin 13 with an iron saturation of 9% and a TIBC of 389.    Patient notes heavy menses.  Patient on ferrous sulfate 325 mg a day. 4.   Bilateral breast MRI on 03/10/2020. 5.   RTC after breast MRI for MD assessment, labs (CBC with diff, ferritin, iron studies), and review of breast MRI.  I discussed the assessment and treatment plan with the patient.  The patient was provided an opportunity to ask questions and all were answered.  The patient agreed with the plan and demonstrated an understanding of the instructions.  The patient was advised to call back if the symptoms worsen or if the condition fails to improve as anticipated.  I provided 11 minutes of face-to-face time during this this encounter and > 50% was spent counseling as documented under my assessment and plan. An additional 5 minutes were spent reviewing her chart (Epic and Care Everywhere) including notes, labs, and imaging studies.    Thula Stewart C. Mike Gip, MD, PhD    09/28/2019, 8:21 PM  I, Mirian Mo Tufford, am acting as Education administrator for [employer]. Mike Gip, MD, PhD.  I, Allis Quirarte C. Mike Gip, MD, have reviewed the above documentation for accuracy and completeness, and I agree with the above.

## 2019-09-28 ENCOUNTER — Other Ambulatory Visit: Payer: Self-pay

## 2019-09-28 ENCOUNTER — Encounter: Payer: Self-pay | Admitting: Hematology and Oncology

## 2019-09-28 ENCOUNTER — Inpatient Hospital Stay: Payer: BC Managed Care – PPO | Attending: Hematology and Oncology | Admitting: Hematology and Oncology

## 2019-09-28 ENCOUNTER — Inpatient Hospital Stay: Payer: BC Managed Care – PPO

## 2019-09-28 VITALS — BP 117/72 | HR 88 | Temp 98.6°F | Resp 18 | Wt 231.5 lb

## 2019-09-28 DIAGNOSIS — L237 Allergic contact dermatitis due to plants, except food: Secondary | ICD-10-CM | POA: Insufficient documentation

## 2019-09-28 DIAGNOSIS — F329 Major depressive disorder, single episode, unspecified: Secondary | ICD-10-CM | POA: Diagnosis not present

## 2019-09-28 DIAGNOSIS — Z833 Family history of diabetes mellitus: Secondary | ICD-10-CM | POA: Insufficient documentation

## 2019-09-28 DIAGNOSIS — D509 Iron deficiency anemia, unspecified: Secondary | ICD-10-CM

## 2019-09-28 DIAGNOSIS — D500 Iron deficiency anemia secondary to blood loss (chronic): Secondary | ICD-10-CM | POA: Insufficient documentation

## 2019-09-28 DIAGNOSIS — N920 Excessive and frequent menstruation with regular cycle: Secondary | ICD-10-CM | POA: Diagnosis not present

## 2019-09-28 DIAGNOSIS — Z8571 Personal history of Hodgkin lymphoma: Secondary | ICD-10-CM | POA: Diagnosis not present

## 2019-09-28 DIAGNOSIS — Z8249 Family history of ischemic heart disease and other diseases of the circulatory system: Secondary | ICD-10-CM | POA: Diagnosis not present

## 2019-09-28 DIAGNOSIS — E042 Nontoxic multinodular goiter: Secondary | ICD-10-CM | POA: Insufficient documentation

## 2019-09-28 DIAGNOSIS — Z79899 Other long term (current) drug therapy: Secondary | ICD-10-CM | POA: Insufficient documentation

## 2019-09-28 DIAGNOSIS — F17210 Nicotine dependence, cigarettes, uncomplicated: Secondary | ICD-10-CM | POA: Diagnosis not present

## 2019-09-28 DIAGNOSIS — Z923 Personal history of irradiation: Secondary | ICD-10-CM | POA: Diagnosis not present

## 2019-09-28 DIAGNOSIS — E890 Postprocedural hypothyroidism: Secondary | ICD-10-CM | POA: Diagnosis not present

## 2019-09-28 DIAGNOSIS — C8198 Hodgkin lymphoma, unspecified, lymph nodes of multiple sites: Secondary | ICD-10-CM

## 2019-09-28 DIAGNOSIS — E785 Hyperlipidemia, unspecified: Secondary | ICD-10-CM | POA: Insufficient documentation

## 2019-09-28 DIAGNOSIS — Z803 Family history of malignant neoplasm of breast: Secondary | ICD-10-CM | POA: Diagnosis not present

## 2019-09-28 DIAGNOSIS — Z9221 Personal history of antineoplastic chemotherapy: Secondary | ICD-10-CM | POA: Insufficient documentation

## 2019-09-28 DIAGNOSIS — I2510 Atherosclerotic heart disease of native coronary artery without angina pectoris: Secondary | ICD-10-CM | POA: Diagnosis not present

## 2019-09-28 DIAGNOSIS — E279 Disorder of adrenal gland, unspecified: Secondary | ICD-10-CM | POA: Diagnosis not present

## 2019-09-28 DIAGNOSIS — F419 Anxiety disorder, unspecified: Secondary | ICD-10-CM | POA: Insufficient documentation

## 2019-09-28 LAB — CBC WITH DIFFERENTIAL/PLATELET
Abs Immature Granulocytes: 0.04 10*3/uL (ref 0.00–0.07)
Basophils Absolute: 0.1 10*3/uL (ref 0.0–0.1)
Basophils Relative: 1 %
Eosinophils Absolute: 0.2 10*3/uL (ref 0.0–0.5)
Eosinophils Relative: 2 %
HCT: 37.9 % (ref 36.0–46.0)
Hemoglobin: 11.6 g/dL — ABNORMAL LOW (ref 12.0–15.0)
Immature Granulocytes: 0 %
Lymphocytes Relative: 24 %
Lymphs Abs: 2.6 10*3/uL (ref 0.7–4.0)
MCH: 20.8 pg — ABNORMAL LOW (ref 26.0–34.0)
MCHC: 30.6 g/dL (ref 30.0–36.0)
MCV: 68 fL — ABNORMAL LOW (ref 80.0–100.0)
Monocytes Absolute: 0.7 10*3/uL (ref 0.1–1.0)
Monocytes Relative: 7 %
Neutro Abs: 7.4 10*3/uL (ref 1.7–7.7)
Neutrophils Relative %: 66 %
Platelets: 373 10*3/uL (ref 150–400)
RBC: 5.57 MIL/uL — ABNORMAL HIGH (ref 3.87–5.11)
RDW: 21.7 % — ABNORMAL HIGH (ref 11.5–15.5)
WBC: 11 10*3/uL — ABNORMAL HIGH (ref 4.0–10.5)
nRBC: 0 % (ref 0.0–0.2)

## 2019-09-28 LAB — RETICULOCYTES
Immature Retic Fract: 12.2 % (ref 2.3–15.9)
RBC.: 5.36 MIL/uL — ABNORMAL HIGH (ref 3.87–5.11)
Retic Count, Absolute: 69.7 10*3/uL (ref 19.0–186.0)
Retic Ct Pct: 1.3 % (ref 0.4–3.1)

## 2019-09-28 LAB — IRON AND TIBC
Iron: 36 ug/dL (ref 28–170)
Saturation Ratios: 9 % — ABNORMAL LOW (ref 10.4–31.8)
TIBC: 389 ug/dL (ref 250–450)
UIBC: 353 ug/dL

## 2019-09-28 LAB — FERRITIN: Ferritin: 13 ng/mL (ref 11–307)

## 2019-09-28 NOTE — Progress Notes (Signed)
Recent poison oak on chest and should, took steroid, hydrocortisone cream and spray benadryl.

## 2019-10-16 ENCOUNTER — Other Ambulatory Visit: Payer: Self-pay | Admitting: Family Medicine

## 2019-10-16 DIAGNOSIS — E890 Postprocedural hypothyroidism: Secondary | ICD-10-CM

## 2019-10-16 NOTE — Telephone Encounter (Signed)
Requested Prescriptions  Pending Prescriptions Disp Refills  . levothyroxine (SYNTHROID) 175 MCG tablet [Pharmacy Med Name: LEVOTHYROXINE 0.175MG  (175MCG) TABS] 30 tablet 1    Sig: TAKE 1 TABLET(175 MCG) BY MOUTH DAILY BEFORE BREAKFAST     Endocrinology:  Hypothyroid Agents Failed - 10/16/2019 11:59 AM      Failed - TSH needs to be rechecked within 3 months after an abnormal result. Refill until TSH is due.      Failed - TSH in normal range and within 360 days    TSH  Date Value Ref Range Status  08/12/2019 40.500 (H) 0.450 - 4.500 uIU/mL Final         Passed - Valid encounter within last 12 months    Recent Outpatient Visits          1 month ago Major depression, recurrent, chronic ([place])   [facility] Juline Patch, MD   2 months ago Establishing care with new doctor, encounter for   [place], MD   3 years ago Acquired hypothyroidism   [facility] Steele Sizer, MD   3 years ago Acquired hypothyroidism   [facility] Steele Sizer, MD   3 years ago Acquired hypothyroidism   [facility] Steele Sizer, MD      Future Appointments            In 2 months Juline Patch, MD [facility], [facility]

## 2019-11-25 ENCOUNTER — Other Ambulatory Visit: Payer: Self-pay | Admitting: Family Medicine

## 2019-11-25 DIAGNOSIS — E890 Postprocedural hypothyroidism: Secondary | ICD-10-CM

## 2019-11-25 NOTE — Telephone Encounter (Signed)
Requested medication (s) are due for refill today -yes  Requested medication (s) are on the active medication list -yes  Future visit scheduled -yes  Last refill: 10/16/19  Notes to clinic: Patient is overdue lab- fails lab protocol.(she does have upcoming appointment-12/23/19)- Has already had 30 day courtesy RF.  Requested Prescriptions  Pending Prescriptions Disp Refills   levothyroxine (SYNTHROID) 175 MCG tablet [Pharmacy Med Name: LEVOTHYROXINE 0.175MG  (175MCG) TABS] 30 tablet 0    Sig: TAKE 1 TABLET(175 MCG) BY MOUTH DAILY BEFORE BREAKFAST      Endocrinology:  Hypothyroid Agents Failed - 11/25/2019  8:42 AM      Failed - TSH needs to be rechecked within 3 months after an abnormal result. Refill until TSH is due.      Failed - TSH in normal range and within 360 days    TSH  Date Value Ref Range Status  08/12/2019 40.500 (H) 0.450 - 4.500 uIU/mL Final          Passed - Valid encounter within last 12 months    Recent Outpatient Visits           2 months ago Major depression, recurrent, chronic ([place])   [facility] Juline Patch, MD   3 months ago Establishing care with new doctor, encounter for   [place], MD   3 years ago Acquired hypothyroidism   [facility] Steele Sizer, MD   3 years ago Acquired hypothyroidism   [facility] Steele Sizer, MD   3 years ago Acquired hypothyroidism   [facility] Steele Sizer, MD       Future Appointments             In 4 weeks Juline Patch, MD [facility], [facility]                Requested Prescriptions  Pending Prescriptions Disp Refills   levothyroxine (SYNTHROID) 175 MCG tablet [Pharmacy Med Name: LEVOTHYROXINE 0.175MG  (175MCG) TABS] 30 tablet 0    Sig: TAKE 1 TABLET(175 MCG) BY MOUTH DAILY BEFORE BREAKFAST      Endocrinology:  Hypothyroid Agents Failed - 11/25/2019  8:42 AM      Failed - TSH needs to  be rechecked within 3 months after an abnormal result. Refill until TSH is due.      Failed - TSH in normal range and within 360 days    TSH  Date Value Ref Range Status  08/12/2019 40.500 (H) 0.450 - 4.500 uIU/mL Final          Passed - Valid encounter within last 12 months    Recent Outpatient Visits           2 months ago Major depression, recurrent, chronic ([place])   [facility] Juline Patch, MD   3 months ago Establishing care with new doctor, encounter for   [place], MD   3 years ago Acquired hypothyroidism   [facility] Steele Sizer, MD   3 years ago Acquired hypothyroidism   [facility] Steele Sizer, MD   3 years ago Acquired hypothyroidism   [facility] Steele Sizer, MD       Future Appointments             In 4 weeks Juline Patch, MD [facility], [facility]

## 2019-12-23 ENCOUNTER — Encounter: Payer: Self-pay | Admitting: Family Medicine

## 2019-12-23 ENCOUNTER — Ambulatory Visit (INDEPENDENT_AMBULATORY_CARE_PROVIDER_SITE_OTHER): Payer: BC Managed Care – PPO | Admitting: Family Medicine

## 2019-12-23 ENCOUNTER — Other Ambulatory Visit: Payer: Self-pay

## 2019-12-23 VITALS — BP 120/60 | HR 76 | Ht 66.0 in | Wt 235.0 lb

## 2019-12-23 DIAGNOSIS — E890 Postprocedural hypothyroidism: Secondary | ICD-10-CM | POA: Diagnosis not present

## 2019-12-23 NOTE — Progress Notes (Signed)
Date:  12/23/2019   Name:  Jo [name]   DOB:  05/01/1980   MRN:  [id number]   Chief Complaint: Hypothyroidism  Thyroid Problem Presents for follow-up visit. Symptoms include dry skin, hair loss and heat intolerance. Patient reports no anxiety, cold intolerance, constipation, depressed mood, diaphoresis, diarrhea, fatigue, hoarse voice, leg swelling, menstrual problem, nail problem, palpitations, tremors, visual change, weight gain or weight loss.    Lab Results  Component Value Date   CREATININE 0.70 08/12/2019   BUN 12 08/12/2019   NA 141 08/12/2019   K 4.1 08/12/2019   CL 99 08/12/2019   CO2 27 08/12/2019   Lab Results  Component Value Date   CHOL 170 04/19/2016   HDL 51 04/19/2016   LDLCALC 96 04/19/2016   TRIG 113 04/19/2016   CHOLHDL 3.3 04/19/2016   Lab Results  Component Value Date   TSH 40.500 (H) 08/12/2019   Lab Results  Component Value Date   HGBA1C 5.4 04/19/2016   Lab Results  Component Value Date   WBC 11.0 (H) 09/28/2019   HGB 11.6 (L) 09/28/2019   HCT 37.9 09/28/2019   MCV 68.0 (L) 09/28/2019   PLT 373 09/28/2019   Lab Results  Component Value Date   ALT 12 08/12/2019   AST 12 08/12/2019   ALKPHOS 68 08/12/2019   BILITOT <0.2 08/12/2019     Review of Systems  Constitutional: Negative for chills, diaphoresis, fatigue, fever, weight gain and weight loss.  HENT: Negative for drooling, ear discharge, ear pain, hoarse voice and sore throat.   Respiratory: Negative for cough, shortness of breath and wheezing.   Cardiovascular: Negative for chest pain, palpitations and leg swelling.  Gastrointestinal: Negative for abdominal pain, blood in stool, constipation, diarrhea and nausea.  Endocrine: Positive for heat intolerance. Negative for cold intolerance and polydipsia.  Genitourinary: Negative for dysuria, frequency, hematuria, menstrual problem and urgency.  Musculoskeletal: Negative for back pain, myalgias and neck pain.  Skin: Negative  for rash.  Allergic/Immunologic: Negative for environmental allergies.  Neurological: Negative for dizziness, tremors and headaches.  Hematological: Does not bruise/bleed easily.  Psychiatric/Behavioral: Negative for suicidal ideas. The patient is not nervous/anxious.     Patient Active Problem List   Diagnosis Date Noted  . Anxiety 08/31/2019  . Multinodular goiter 08/31/2019  . Iron deficiency anemia 08/31/2019  . Prediabetes 12/06/2016  . Recurrent major depressive disorder, in partial remission ([place]) 12/05/2016  . Tension type headache 04/27/2015  . Hypocalcemia 04/17/2015  . Hypoparathyroidism ([place]) 04/17/2015  . GAD (generalized anxiety disorder) 10/18/2014  . RLS (restless legs syndrome) 10/18/2014  . Insomnia, persistent 10/18/2014  . Migraine without aura and responsive to treatment 10/18/2014  . CN (constipation) 10/18/2014  . Major depression, recurrent, chronic ([place]) 10/18/2014  . Dysmenorrhea 10/18/2014  . Female infertility 10/18/2014  . Gastro-esophageal reflux disease without esophagitis 10/18/2014  . Allergy, food 10/18/2014  . Hodgkin lymphoma, unspecified, unspecified site ([place]) 10/18/2014  . H/O thyroidectomy 10/18/2014  . Metabolic syndrome 17/79/3903  . Morbid obesity, unspecified obesity type ([place]) 10/18/2014  . Low parathyroid level after removal 10/18/2014  . Vitamin D deficiency 10/18/2014  . Hive 10/18/2014  . Dermatitis seborrheica 10/18/2014  . Arthralgia of temporomandibular joint 10/18/2014  . Post-surgical hypothyroidism 10/13/2012    Allergies  Allergen Reactions  . Tape Rash    Uncoded Allergy. Allergen: Msg  . Barium-Containing Compounds Hives    Oral contrast  . Monosodium Glutamate     Unknown.  . Other Rash  and Other (See Comments)    Contrast dye Uncoded Allergy. Allergen: Msg Contrast dye  Pt states she has no Iodine or Omni IV contrast reaction. She is only allergic to Barium Oral contrast. ASW 09/08/19     Past Surgical  History:  Procedure Laterality Date  . lymphonodi biopsy    . PORT-A-CATH REMOVAL  2004  . PORTACATH PLACEMENT  11-24-12  . THYROID SURGERY    . THYROIDECTOMY  10-13-12  . uterine poly removal Left 08/2010   [facility]    Social History   Tobacco Use  . Smoking status: Former Smoker    Packs/day: 1.00    Years: 0.00    Pack years: 0.00    Types: Cigarettes    Quit date: 07/27/2009    Years since quitting: 10.4  . Smokeless tobacco: Never Used  Substance Use Topics  . Alcohol use: Yes    Alcohol/week: 0.0 standard drinks    Comment: rarely  . Drug use: No     Medication list has been reviewed and updated.  Current Meds  Medication Sig  . Ascorbic Acid (VITAMIN C PO) Take by mouth.  . Betamethasone Dipropionate 0.05 % EMUL Apply topically.   . calcitRIOL (ROCALTROL) 0.5 MCG capsule Take 1 capsule (0.5 mcg total) by mouth 2 (two) times daily.  . Calcium Citrate 333 MG TABS   . ferrous sulfate 325 (65 FE) MG EC tablet Take 1 tablet (325 mg total) by mouth every morning.  . fexofenadine (ALLEGRA) 180 MG tablet Take by mouth.  . hydrocortisone cream 0.5 %   . hydrOXYzine (ATARAX/VISTARIL) 25 MG tablet Take 1 tablet (25 mg total) by mouth every 6 (six) hours as needed.  Marland Kitchen levothyroxine (SYNTHROID) 175 MCG tablet TAKE 1 TABLET(175 MCG) BY MOUTH DAILY BEFORE BREAKFAST  . Multiple Vitamin (MULTIVITAMIN ADULT PO) Take by mouth.  Marland Kitchen omeprazole (PRILOSEC OTC) 20 MG tablet Take by mouth.  . SOOLANTRA 1 % CREA Apply topically.     PHQ 2/9 Scores 12/23/2019 09/09/2019 08/12/2019 07/26/2016  PHQ - 2 Score 0 2 0 2  PHQ- 9 Score 0 4 1 5   Exception Documentation - - - -  Not completed - - - -    GAD 7 : Generalized Anxiety Score 12/23/2019 09/09/2019 08/12/2019  Nervous, Anxious, on Edge 0 1 1  Control/stop worrying 0 0 2  Worry too much - different things 0 0 2  Trouble relaxing 0 0 1  Restless 0 0 0  Easily annoyed or irritable 0 1 1  Afraid - awful might happen 0 1 0  Total GAD 7 Score 0  3 7  Anxiety Difficulty - Not difficult at all Not difficult at all    BP Readings from Last 3 Encounters:  12/23/19 120/60  09/28/19 117/72  09/09/19 122/76    Physical Exam Vitals and nursing note reviewed.  Constitutional:      Appearance: She is well-developed.  HENT:     Head: Normocephalic.     Right Ear: Tympanic membrane, ear canal and external ear normal. There is no impacted cerumen.     Left Ear: Tympanic membrane, ear canal and external ear normal. There is no impacted cerumen.     Nose: Nose normal.     Mouth/Throat:     Mouth: Mucous membranes are moist.  Eyes:     General: Lids are everted, no foreign bodies appreciated. No scleral icterus.       Left eye: No foreign body or hordeolum.  Conjunctiva/sclera: Conjunctivae normal.     Right eye: Right conjunctiva is not injected.     Left eye: Left conjunctiva is not injected.     Pupils: Pupils are equal, round, and reactive to light.  Neck:     Thyroid: No thyromegaly.     Vascular: No JVD.     Trachea: No tracheal deviation.  Cardiovascular:     Rate and Rhythm: Normal rate and regular rhythm.     Heart sounds: Normal heart sounds. No murmur heard.  No friction rub. No gallop.   Pulmonary:     Effort: Pulmonary effort is normal. No respiratory distress.     Breath sounds: Normal breath sounds. No wheezing or rales.  Abdominal:     General: Bowel sounds are normal.     Palpations: Abdomen is soft. There is no mass.     Tenderness: There is no abdominal tenderness. There is no guarding or rebound.  Musculoskeletal:        General: No tenderness. Normal range of motion.     Cervical back: Normal range of motion and neck supple.  Lymphadenopathy:     Cervical: No cervical adenopathy.  Skin:    General: Skin is warm.     Findings: No rash.  Neurological:     Mental Status: She is alert and oriented to person, place, and time.     Cranial Nerves: No cranial nerve deficit.     Deep Tendon Reflexes:  Reflexes normal.  Psychiatric:        Mood and Affect: Mood is not anxious or depressed.     Wt Readings from Last 3 Encounters:  12/23/19 235 lb (106.6 kg)  09/28/19 231 lb 7.7 oz (105 kg)  09/09/19 (!) 236 lb (107 kg)    BP 120/60   Pulse 76   Ht 5\' 6"  (1.676 m)   Wt 235 lb (106.6 kg)   LMP 12/15/2019 (Exact Date)   BMI 37.93 kg/m    Assessment and Plan: 1. Post-surgical hypothyroidism Chronic.  Persistent.  Stable.  Questionable control.  Last visit it was noted TSH was 40 but patient was on [place] and we switched her over to conventional levothyroxine.  Patient still has some symptomatology but is not as bad.  We will recheck TSH with corresponding thyroid hormones. - Thyroid Panel With TSH  2. Low parathyroid level after removal Patient has history of low parathyroid level since removal of total thyroid.  Calcium has been low but has been relatively stable and patient has not been symptomatic.  We will recheck a renal function panel so that we will have access to phosphorus as well as calcium. - Renal Function Panel

## 2019-12-24 LAB — RENAL FUNCTION PANEL
Albumin: 4.3 g/dL (ref 3.8–4.8)
BUN/Creatinine Ratio: 26 — ABNORMAL HIGH (ref 9–23)
BUN: 19 mg/dL (ref 6–20)
CO2: 23 mmol/L (ref 20–29)
Calcium: 8.4 mg/dL — ABNORMAL LOW (ref 8.7–10.2)
Chloride: 96 mmol/L (ref 96–106)
Creatinine, Ser: 0.74 mg/dL (ref 0.57–1.00)
GFR calc Af Amer: 120 mL/min/{1.73_m2} (ref >59)
GFR calc non Af Amer: 104 mL/min/{1.73_m2} (ref >59)
Glucose: 87 mg/dL (ref 65–99)
Phosphorus: 4.8 mg/dL — ABNORMAL HIGH (ref 3.0–4.3)
Potassium: 5.1 mmol/L (ref 3.5–5.2)
Sodium: 141 mmol/L (ref 134–144)

## 2019-12-24 LAB — THYROID PANEL WITH TSH
Free Thyroxine Index: 2.1 (ref 1.2–4.9)
T3 Uptake Ratio: 24 % (ref 24–39)
T4, Total: 8.9 ug/dL (ref 4.5–12.0)
TSH: 7.98 u[IU]/mL — ABNORMAL HIGH (ref 0.450–4.500)

## 2019-12-27 ENCOUNTER — Other Ambulatory Visit: Payer: Self-pay

## 2019-12-27 DIAGNOSIS — E890 Postprocedural hypothyroidism: Secondary | ICD-10-CM

## 2019-12-27 MED ORDER — LEVOTHYROXINE SODIUM 200 MCG PO TABS: 200.0000 ug | ORAL_TABLET | Freq: Every day | ORAL | 1 refills | Status: DC

## 2019-12-27 NOTE — Progress Notes (Unsigned)
Changed levo to 200- sent in

## 2019-12-31 ENCOUNTER — Ambulatory Visit: Payer: Self-pay | Admitting: *Deleted

## 2019-12-31 NOTE — Telephone Encounter (Signed)
noted 

## 2019-12-31 NOTE — Telephone Encounter (Signed)
Pt given lab results per notes of Dr. Ronnald Ramp on 12/31/19. Pt verbalized understanding. Patient reports she has already picked up new Rx for levothyroxine 200 mcg and will come to have labs in 6- 8 weeks.

## 2020-01-05 ENCOUNTER — Other Ambulatory Visit (HOSPITAL_COMMUNITY)
Admission: RE | Admit: 2020-01-05 | Discharge: 2020-01-05 | Disposition: A | Discharge status: Home / Self Care | Payer: BC Managed Care – PPO | Source: Ambulatory Visit | Attending: Advanced Practice Midwife | Admitting: Advanced Practice Midwife

## 2020-01-05 ENCOUNTER — Encounter: Payer: Self-pay | Admitting: Advanced Practice Midwife

## 2020-01-05 ENCOUNTER — Other Ambulatory Visit: Payer: Self-pay

## 2020-01-05 ENCOUNTER — Ambulatory Visit (INDEPENDENT_AMBULATORY_CARE_PROVIDER_SITE_OTHER): Payer: BC Managed Care – PPO | Admitting: Advanced Practice Midwife

## 2020-01-05 VITALS — BP 134/78 | Ht 66.0 in | Wt 234.0 lb

## 2020-01-05 DIAGNOSIS — Z124 Encounter for screening for malignant neoplasm of cervix: Secondary | ICD-10-CM | POA: Diagnosis not present

## 2020-01-05 DIAGNOSIS — R102 Pelvic and perineal pain: Secondary | ICD-10-CM | POA: Diagnosis not present

## 2020-01-05 DIAGNOSIS — Z01419 Encounter for gynecological examination (general) (routine) without abnormal findings: Secondary | ICD-10-CM

## 2020-01-05 LAB — POCT URINALYSIS DIPSTICK
Glucose, UA: NEGATIVE
Protein, UA: POSITIVE — AB

## 2020-01-05 NOTE — Progress Notes (Signed)
Gynecology Annual Exam   PCP: Juline Patch, MD  Chief Complaint:  Chief Complaint  Patient presents with  . Gynecologic Exam    vaginal pain in evenings x 2 weeks    History of Present Illness: Patient is a 39 y.o. G0P0 presents for annual exam. The patient has complaint today of cramping pain in her lower pelvis especially in the evenings for the past 2 weeks. She is sexually active and does not think the pain is associated with intercourse. She denies risk for STDs. She has used ibuprofen with good relief. She has not tried heat or tub. She denies any other symptoms of UTI such as burning, frequency or urgency. She has a history of ovarian cysts and thinks the pain is similar.    LMP: Patient's last menstrual period was 12/15/2019 (exact date). Average Interval: regular, 28 days Duration of flow: 7 days Heavy Menses: yes- she does not change a pad more than once an hour Clots: no Intermenstrual Bleeding: no Postcoital Bleeding: no Dysmenorrhea: no  The patient is sexually active. She currently uses none for contraception. She has a history of lymphoma and radiation treatments and has not been able to conceive since then. She denies dyspareunia.  The patient does perform self breast exams.  There is notable family history of breast or ovarian cancer in her family. Her mother had breast cancer diagnosed in her 57s and ovarian cancer diagnosed in her 55s. The patient declines MyRisk testing at this time. She has regular mammograms, per her oncologist, which have been normal.  The patient wears seatbelts: yes.   The patient has regular exercise: She is active taking care of the family dogs. 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 She drinks a minimal amount of water and also diet dr pepper. She admits about 6 hours of sleep per night.    The patient denies current symptoms of depression.    Review of Systems: Review of Systems  Constitutional: Negative for chills  and fever.  HENT: Negative for congestion, ear discharge, ear pain, hearing loss, sinus pain and sore throat.   Eyes: Negative for blurred vision and double vision.  Respiratory: Negative for cough, shortness of breath and wheezing.   Cardiovascular: Negative for chest pain, palpitations and leg swelling.  Gastrointestinal: Negative for abdominal pain, blood in stool, constipation, diarrhea, heartburn, melena, nausea and vomiting.  Genitourinary: Negative for dysuria, flank pain, frequency, hematuria and urgency.       Positive for pelvic pain  Musculoskeletal: Negative for back pain, joint pain and myalgias.  Skin: Negative for itching and rash.  Neurological: Negative for dizziness, tingling, tremors, sensory change, speech change, focal weakness, seizures, loss of consciousness, weakness and headaches.  Endo/Heme/Allergies: Negative for environmental allergies. Does not bruise/bleed easily.  Psychiatric/Behavioral: Negative for depression, hallucinations, memory loss, substance abuse and suicidal ideas. The patient is not nervous/anxious and does not have insomnia.     Past Medical History:  Patient Active Problem List   Diagnosis Date Noted  . Anxiety 08/31/2019  . Multinodular goiter 08/31/2019  . Iron deficiency anemia 08/31/2019  . Prediabetes 12/06/2016    Formatting of this note might be different from the original. HgbA1c 5.9% on 12/05/16   . Recurrent major depressive disorder, in partial remission ([place]) 12/05/2016  . Tension type headache 04/27/2015  . Hypocalcemia 04/17/2015  . Hypoparathyroidism ([place]) 04/17/2015  . GAD (generalized anxiety disorder) 10/18/2014  . RLS (restless legs syndrome) 10/18/2014  .  Insomnia, persistent 10/18/2014  . Migraine without aura and responsive to treatment 10/18/2014  . CN (constipation) 10/18/2014  . Major depression, recurrent, chronic ([place]) 10/18/2014  . Dysmenorrhea 10/18/2014  . Female infertility 10/18/2014  . Gastro-esophageal  reflux disease without esophagitis 10/18/2014  . Allergy, food 10/18/2014  . Hodgkin lymphoma, unspecified, unspecified site ([place]) 10/18/2014    recurrence in 2014 but in remission again since 04/2013  after remission after 13 years   . H/O thyroidectomy 10/18/2014  . Metabolic syndrome 26/83/4196  . Morbid obesity, unspecified obesity type ([place]) 10/18/2014  . Low parathyroid level after removal 10/18/2014    had thyroidectomy followed neck radiation for lymphoma therapy and now not functional   . Vitamin D deficiency 10/18/2014  . Hive 10/18/2014  . Dermatitis seborrheica 10/18/2014  . Arthralgia of temporomandibular joint 10/18/2014  . Post-surgical hypothyroidism 10/13/2012    s/p thyroidectomy     Past Surgical History:  Past Surgical History:  Procedure Laterality Date  . lymphonodi biopsy    . PORT-A-CATH REMOVAL  2004  . PORTACATH PLACEMENT  11-24-12  . THYROID SURGERY    . THYROIDECTOMY  10-13-12  . uterine poly removal Left 08/2010   [facility]    Gynecologic History:  Patient's last menstrual period was 12/15/2019 (exact date). Contraception: none Last Pap: 6 years ago Results were: no abnormalities   Obstetric History: G0P0  Family History:  Family History  Problem Relation Age of Onset  . Hypertension Mother   . Liver disease Mother   . Cancer Mother        breast cancer, cervical cancer  . Breast cancer Mother 41  . Diabetes Father   . Heart disease Father   . CAD Father   . Sudden Cardiac Death Father   . Coronary artery disease Father   . Diabetes Brother   . Breast cancer Paternal Aunt        >50  . Breast cancer Cousin   . Asthma Cousin        64s  . CAD Maternal Grandmother   . Parkinson's disease Maternal Grandmother   . Coronary artery disease Maternal Grandmother   . Parkinson's disease Maternal Grandfather   . Coronary artery disease Maternal Grandfather   . CAD Paternal Grandmother   . Coronary artery disease Paternal Grandmother   .  Diabetes Paternal Grandmother   . Breast cancer Maternal Aunt        late years  . Coronary artery disease Paternal Grandfather   . Diabetes Paternal Grandfather     Social History:  Social History   Socioeconomic History  . Marital status: Married    Spouse name: Not on file  . Number of children: Not on file  . Years of education: Not on file  . Highest education level: Not on file  Occupational History  . Occupation: billing     Comment: [place]  Tobacco Use  . Smoking status: Former Smoker    Packs/day: 1.00    Years: 0.00    Pack years: 0.00    Types: Cigarettes    Quit date: 07/27/2009    Years since quitting: 10.4  . Smokeless tobacco: Never Used  Vaping Use  . Vaping Use: Never used  Substance and Sexual Activity  . Alcohol use: Yes    Alcohol/week: 0.0 standard drinks    Comment: rarely  . Drug use: No  . Sexual activity: Yes    Birth control/protection: None  Other Topics Concern  .  Not on file  Social History Narrative   Married, has fertility problems   Social Determinants of Health   Financial Resource Strain:   . Difficulty of Paying Living Expenses: Not on file  Food Insecurity:   . Worried About Charity fundraiser in the Last Year: Not on file  . Ran Out of Food in the Last Year: Not on file  Transportation Needs:   . Lack of Transportation (Medical): Not on file  . Lack of Transportation (Non-Medical): Not on file  Physical Activity:   . Days of Exercise per Week: Not on file  . Minutes of Exercise per Session: Not on file  Stress:   . Feeling of Stress : Not on file  Social Connections:   . Frequency of Communication with Friends and Family: Not on file  . Frequency of Social Gatherings with Friends and Family: Not on file  . Attends Religious Services: Not on file  . Active Member of Clubs or Organizations: Not on file  . Attends Archivist Meetings: Not on file  . Marital Status: Not on file    Intimate Partner Violence:   . Fear of Current or Ex-Partner: Not on file  . Emotionally Abused: Not on file  . Physically Abused: Not on file  . Sexually Abused: Not on file    Allergies:  Allergies  Allergen Reactions  . Tape Rash    Uncoded Allergy. Allergen: Msg  . Barium-Containing Compounds Hives    Oral contrast  . Monosodium Glutamate     Unknown.  . Other Rash and Other (See Comments)    Contrast dye Uncoded Allergy. Allergen: Msg Contrast dye  Pt states she has no Iodine or Omni IV contrast reaction. She is only allergic to Barium Oral contrast. ASW 09/08/19     Medications: Prior to Admission medications   Medication Sig Start Date End Date Taking? Authorizing Provider  Ascorbic Acid (VITAMIN C PO) Take by mouth.   Yes [provider]  Betamethasone Dipropionate 0.05 % EMUL Apply topically.    Yes [provider]  calcitRIOL (ROCALTROL) 0.5 MCG capsule Take 1 capsule (0.5 mcg total) by mouth 2 (two) times daily. 09/09/19  Yes Juline Patch, MD  Calcium Citrate 333 MG TABS    Yes [provider]  EPINEPHrine 0.3 mg/0.3 mL IJ SOAJ injection  06/19/10  Yes [provider]  ferrous sulfate 325 (65 FE) MG EC tablet Take 1 tablet (325 mg total) by mouth every morning. 08/18/19  Yes Juline Patch, MD  fexofenadine (ALLEGRA) 180 MG tablet Take by mouth.   Yes [provider]  hydrocortisone cream 0.5 %  01/12/19  Yes [provider]  hydrOXYzine (ATARAX/VISTARIL) 25 MG tablet Take 1 tablet (25 mg total) by mouth every 6 (six) hours as needed. 10/26/15  Yes Sowles, Drue Stager, MD  levothyroxine (SYNTHROID) 200 MCG tablet Take 1 tablet (200 mcg total) by mouth daily before breakfast. 12/27/19  Yes Juline Patch, MD  Multiple Vitamin (MULTIVITAMIN ADULT PO) Take by mouth.   Yes [provider]  omeprazole (PRILOSEC OTC) 20 MG tablet Take by mouth. 06/19/10  Yes [provider]  SOOLANTRA 1 % CREA Apply topically.   03/23/19  Yes [provider]    Physical Exam Vitals: Blood pressure 134/78, height 5\' 6"  (1.676 m), weight 234 lb (106.1 kg), last menstrual period 12/15/2019.  General: NAD HEENT: normocephalic, anicteric Thyroid: no enlargement, no palpable nodules Pulmonary: No increased work of  breathing, CTAB Cardiovascular: RRR, distal pulses 2+ Breast: Breast symmetrical, no tenderness, no palpable nodules or masses, no skin or nipple retraction present, no nipple discharge.  No axillary or supraclavicular lymphadenopathy. Abdomen: NABS, soft, non-tender, non-distended.  Umbilicus without lesions.  No hepatomegaly, splenomegaly or masses palpable. No evidence of hernia  Genitourinary:  External: Normal external female genitalia.  Normal urethral meatus, normal Bartholin's and Skene's glands.    Vagina: Normal vaginal mucosa, no evidence of prolapse.    Cervix: Grossly normal in appearance, no bleeding, no CMT  Uterus: Non-enlarged, mobile, normal contour. Mildly tender to palpation   Adnexa: ovaries non-enlarged, no adnexal masses, mildly tender to palpation, unable to elicit similar pain on exam as patient describes   Rectal: deferred  Lymphatic: no evidence of inguinal lymphadenopathy Extremities: no edema, erythema, or tenderness Neurologic: Grossly intact Psychiatric: mood appropriate, affect full  Urinalysis: positive protein and blood  Assessment: 39 y.o. G0P0 routine annual exam  Plan: Problem List Items Addressed This Visit    None    Visit Diagnoses    Well woman exam with routine gynecological exam    -  Primary   Relevant Orders   Cytology - PAP   Cervical cancer screening       Relevant Orders   Cytology - PAP   Pelvic pain       Relevant Orders   Urine Culture      1) STI screening  was offered and declined  2)  ASCCP guidelines and rationale discussed.  Patient opts for every 3 years screening interval  3) Contraception - the patient is currently using   none.  She is happy with her current form of contraception and plans to continue  4) Routine healthcare maintenance including cholesterol, diabetes screening discussed managed by PCP   5) Pelvic pain: watchful waiting per patient preference, she will let me know if pain worsens and we will schedule gyn ultrasound  5) Return in about 1 year (around 01/04/2021) for annual established gyn.   Rod Can, [facility] 01/05/2020, 3:09 PM

## 2020-01-05 NOTE — Addendum Note (Signed)
Addended by: Drenda Freeze on: 01/05/2020 04:21 PM   Modules accepted: Orders

## 2020-01-05 NOTE — Patient Instructions (Signed)
Health Maintenance, Female Adopting a healthy lifestyle and getting preventive care are important in promoting health and wellness. Ask your health care provider about:  The right schedule for you to have regular tests and exams.  Things you can do on your own to prevent diseases and keep yourself healthy. What should I know about diet, weight, and exercise? Eat a healthy diet   Eat a diet that includes plenty of vegetables, fruits, low-fat dairy products, and lean protein.  Do not eat a lot of foods that are high in solid fats, added sugars, or sodium. Maintain a healthy weight Body mass index (BMI) is used to identify weight problems. It estimates body fat based on height and weight. Your health care provider can help determine your BMI and help you achieve or maintain a healthy weight. Get regular exercise Get regular exercise. This is one of the most important things you can do for your health. Most adults should:  Exercise for at least 150 minutes each week. The exercise should increase your heart rate and make you sweat (moderate-intensity exercise).  Do strengthening exercises at least twice a week. This is in addition to the moderate-intensity exercise.  Spend less time sitting. Even light physical activity can be beneficial. Watch cholesterol and blood lipids Have your blood tested for lipids and cholesterol at 37 years of age, then have this test every 5 years. Have your cholesterol levels checked more often if:  Your lipid or cholesterol levels are high.  You are older than 37 years of age.  You are at high risk for heart disease. What should I know about cancer screening? Depending on your health history and family history, you may need to have cancer screening at various ages. This may include screening for:  Breast cancer.  Cervical cancer.  Colorectal cancer.  Skin cancer.  Lung cancer. What should I know about heart disease, diabetes, and high blood  pressure? Blood pressure and heart disease  High blood pressure causes heart disease and increases the risk of stroke. This is more likely to develop in people who have high blood pressure readings, are of African descent, or are overweight.  Have your blood pressure checked: ? Every 3-5 years if you are 18-39 years of age. ? Every year if you are 40 years old or older. Diabetes Have regular diabetes screenings. This checks your fasting blood sugar level. Have the screening done:  Once every three years after age 40 if you are at a normal weight and have a low risk for diabetes.  More often and at a younger age if you are overweight or have a high risk for diabetes. What should I know about preventing infection? Hepatitis B If you have a higher risk for hepatitis B, you should be screened for this virus. Talk with your health care provider to find out if you are at risk for hepatitis B infection. Hepatitis C Testing is recommended for:  Everyone born from 1945 through 1965.  Anyone with known risk factors for hepatitis C. Sexually transmitted infections (STIs)  Get screened for STIs, including gonorrhea and chlamydia, if: ? You are sexually active and are younger than 37 years of age. ? You are older than 37 years of age and your health care provider tells you that you are at risk for this type of infection. ? Your sexual activity has changed since you were last screened, and you are at increased risk for chlamydia or gonorrhea. Ask your health care provider if   you are at risk.  Ask your health care provider about whether you are at high risk for HIV. Your health care provider may recommend a prescription medicine to help prevent HIV infection. If you choose to take medicine to prevent HIV, you should first get tested for HIV. You should then be tested every 3 months for as long as you are taking the medicine. Pregnancy  If you are about to stop having your period (premenopausal) and  you may become pregnant, seek counseling before you get pregnant.  Take 400 to 800 micrograms (mcg) of folic acid every day if you become pregnant.  Ask for birth control (contraception) if you want to prevent pregnancy. Osteoporosis and menopause Osteoporosis is a disease in which the bones lose minerals and strength with aging. This can result in bone fractures. If you are 65 years old or older, or if you are at risk for osteoporosis and fractures, ask your health care provider if you should:  Be screened for bone loss.  Take a calcium or vitamin D supplement to lower your risk of fractures.  Be given hormone replacement therapy (HRT) to treat symptoms of menopause. Follow these instructions at home: Lifestyle  Do not use any products that contain nicotine or tobacco, such as cigarettes, e-cigarettes, and chewing tobacco. If you need help quitting, ask your health care provider.  Do not use street drugs.  Do not share needles.  Ask your health care provider for help if you need support or information about quitting drugs. Alcohol use  Do not drink alcohol if: ? Your health care provider tells you not to drink. ? You are pregnant, may be pregnant, or are planning to become pregnant.  If you drink alcohol: ? Limit how much you use to 0-1 drink a day. ? Limit intake if you are breastfeeding.  Be aware of how much alcohol is in your drink. In the [place] one drink equals one 12 oz bottle of beer (355 mL), one 5 oz glass of wine (148 mL), or one 1 oz glass of hard liquor (44 mL). General instructions  Schedule regular health, dental, and eye exams.  Stay current with your vaccines.  Tell your health care provider if: ? You often feel depressed. ? You have ever been abused or do not feel safe at home. Summary  Adopting a healthy lifestyle and getting preventive care are important in promoting health and wellness.  Follow your health care provider's instructions about healthy  diet, exercising, and getting tested or screened for diseases.  Follow your health care provider's instructions on monitoring your cholesterol and blood pressure. This information is not intended to replace advice given to you by your health care provider. Make sure you discuss any questions you have with your health care provider. Document Revised: 01/21/2018 Document Reviewed: 01/21/2018 Elsevier Patient Education  2020 Elsevier Inc.  

## 2020-01-07 LAB — URINE CULTURE

## 2020-01-14 LAB — CYTOLOGY - PAP
Comment: NEGATIVE
Diagnosis: NEGATIVE
High risk HPV: NEGATIVE

## 2020-01-17 ENCOUNTER — Other Ambulatory Visit: Payer: Self-pay

## 2020-03-20 ENCOUNTER — Encounter: Payer: Self-pay | Admitting: Family Medicine

## 2020-03-20 ENCOUNTER — Other Ambulatory Visit: Payer: Self-pay

## 2020-03-20 ENCOUNTER — Other Ambulatory Visit: Payer: Self-pay | Admitting: Family Medicine

## 2020-03-20 DIAGNOSIS — E890 Postprocedural hypothyroidism: Secondary | ICD-10-CM

## 2020-03-20 MED ORDER — LEVOTHYROXINE SODIUM 200 MCG PO TABS: 200.0000 ug | ORAL_TABLET | Freq: Every day | ORAL | 0 refills | Status: DC

## 2020-03-22 ENCOUNTER — Ambulatory Visit: Payer: 59

## 2020-03-22 LAB — THYROID PANEL WITH TSH
Free Thyroxine Index: 2.5 (ref 1.2–4.9)
T3 Uptake Ratio: 28 % (ref 24–39)
T4, Total: 8.9 ug/dL (ref 4.5–12.0)
TSH: 4.74 u[IU]/mL — ABNORMAL HIGH (ref 0.450–4.500)

## 2020-03-23 NOTE — Progress Notes (Signed)
[facility]  [address], [address] [place], [place] [address] Phone: [phone]  Fax: [phone]   Clinic Day: 03/27/20  Referring physician: Juline Patch, MD  Chief Complaint: Jo [name] is a 40 y.o. female with a history of recurrent Hodgkin's disease who is seen for 6 month assessment and review of interval breast MRI.  HPI: The patient was last seen in the oncology clinic on 09/28/2019. At that time, she was doing well.  She denied any B symptoms.  She had a poison ivy rash. Hematocrit was 37.9, hemoglobin 11.6, platelets 373,000, WBC 11,000. Ferritin was 13 with an iron saturation of 9% and a TIBC of 389. Reticulocyte count was 1.3%. She continued oral iron.  Breast MRI was ordered but not scheduled.  During the interim, she has felt "good".  She had COVID-19 in mid-January and has not had an appetite since then. She eats breakfast and a small dinner and snacks on fruit throughout the day. She has increased her water intake. She has been losing weight.  She describes heavy menses that last 5-7 days. She has large clots for the first 3 days of her period. When she stands up, she feels a gush of blood. She does not get dizzy or lightheaded during her periods. She denies blood in the stool and black stools. She denies ice pica, excess bruising/bleeding, and restless legs. She takes oral iron once daily with vitamin C.  She takes Aleve a couple of times per week. She occasionally takes Excedrin.  She is not smoking.  She notes that her insurance changed in 01/2020 and 3D mammograms and breast MRIs are no longer covered.  A regular mammogram is covered.   Past Medical History:  Diagnosis Date  . Anxiety   . Constipation   . Cramp of limb   . Depression   . GERD (gastroesophageal reflux disease)   . Hives   . Hodgkin disease ([place]) 2001 and 2014   chemo and rad tx in 2001 and 2014  . Hodgkin's disease in remission ([place])   . Hyperlipidemia   .  Insomnia   . Lymphoma, Hodgkin's ([place])    2001, 2014  . Migraine   . Obesity   . Oligomenorrhea   . Personal history of chemotherapy   . Personal history of radiation therapy   . Primary female infertility   . Screening mammogram for high-risk patient   . Seborrhea   . Snoring   . Thyroid disease   . TMJ (dislocation of temporomandibular joint)   . Vitamin D deficiency     Past Surgical History:  Procedure Laterality Date  . lymphonodi biopsy    . PORT-A-CATH REMOVAL  2004  . PORTACATH PLACEMENT  11-24-12  . THYROID SURGERY    . THYROIDECTOMY  10-13-12  . uterine poly removal Left 08/2010   [facility]    Family History  Problem Relation Age of Onset  . Hypertension Mother   . Liver disease Mother   . Cancer Mother        breast cancer, cervical cancer  . Breast cancer Mother 2  . Diabetes Father   . Heart disease Father   . CAD Father   . Sudden Cardiac Death Father   . Coronary artery disease Father   . Diabetes Brother   . Breast cancer Paternal Aunt        >50  . Breast cancer Cousin   . Asthma Cousin  61s  . CAD Maternal Grandmother   . Parkinson's disease Maternal Grandmother   . Coronary artery disease Maternal Grandmother   . Parkinson's disease Maternal Grandfather   . Coronary artery disease Maternal Grandfather   . CAD Paternal Grandmother   . Coronary artery disease Paternal Grandmother   . Diabetes Paternal Grandmother   . Breast cancer Maternal Aunt        late years  . Coronary artery disease Paternal Grandfather   . Diabetes Paternal Grandfather     Social History:  reports that she quit smoking about 10 years ago. Her smoking use included cigarettes. She smoked 1.00 pack per day for 0.00 years. She has never used smokeless tobacco. She reports current alcohol use. She reports that she does not use drugs. She lives in [place]. She has 5 dogs. 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 The patient  is alone today.   Allergies:  Allergies  Allergen Reactions  . Tape Rash    Uncoded Allergy. Allergen: Msg  . Barium-Containing Compounds Hives    Oral contrast  . Monosodium Glutamate     Unknown.  . Other Rash and Other (See Comments)    Contrast dye Uncoded Allergy. Allergen: Msg Contrast dye  Pt states she has no Iodine or Omni IV contrast reaction. She is only allergic to Barium Oral contrast. ASW 09/08/19     Current Medications: Current Outpatient Medications  Medication Sig Dispense Refill  . Ascorbic Acid (VITAMIN C PO) Take by mouth.    . Betamethasone Dipropionate 0.05 % EMUL Apply topically.     . calcitRIOL (ROCALTROL) 0.5 MCG capsule Take 1 capsule (0.5 mcg total) by mouth 2 (two) times daily. 180 capsule 1  . Calcium Citrate 333 MG TABS     . EPINEPHrine 0.3 mg/0.3 mL IJ SOAJ injection     . ferrous sulfate 325 (65 FE) MG EC tablet Take 1 tablet (325 mg total) by mouth every morning. 30 tablet 0  . fexofenadine (ALLEGRA) 180 MG tablet Take by mouth.    . hydrocortisone cream 0.5 %     . hydrOXYzine (ATARAX/VISTARIL) 25 MG tablet Take 1 tablet (25 mg total) by mouth every 6 (six) hours as needed. 30 tablet 0  . levothyroxine (SYNTHROID) 200 MCG tablet Take 1 tablet (200 mcg total) by mouth daily before breakfast. 30 tablet 0  . Multiple Vitamin (MULTIVITAMIN ADULT PO) Take by mouth.    Marland Kitchen omeprazole (PRILOSEC OTC) 20 MG tablet Take by mouth.    . SOOLANTRA 1 % CREA Apply topically.      No current facility-administered medications for this visit.    Review of Systems  Constitutional: Positive for weight loss (7 lbs). Negative for chills, diaphoresis, fever and malaise/fatigue.       Feels "good".  HENT: Negative.  Negative for congestion, ear discharge, ear pain, hearing loss, nosebleeds, sinus pain, sore throat and tinnitus.   Eyes: Negative.  Negative for blurred vision, double vision, photophobia and pain.  Respiratory: Negative.  Negative for cough,  hemoptysis, sputum production, shortness of breath and stridor.   Cardiovascular: Negative.  Negative for chest pain, palpitations and leg swelling.  Gastrointestinal: Positive for heartburn (on Prilosec). Negative for constipation, diarrhea, nausea and vomiting.       Alpha-gal syndrome. Poor appetite.  Genitourinary: Negative for dysuria, frequency, hematuria and urgency.       Heavy menses.  Musculoskeletal: Negative.  Negative for back  pain, joint pain, myalgias and neck pain.  Skin: Negative for itching and rash.  Neurological: Negative.  Negative for dizziness, tingling, sensory change, weakness and headaches.  Endo/Heme/Allergies: Negative.  Does not bruise/bleed easily.  Psychiatric/Behavioral: Negative.  Negative for depression and memory loss. The patient is not nervous/anxious and does not have insomnia.   All other systems reviewed and are negative.  Performance status (ECOG): 0  Vitals Blood pressure 133/84, pulse 92, temperature 98.2 F (36.8 C), temperature source Oral, weight 224 lb 6.9 oz (101.8 kg), SpO2 98 %.   Physical Exam Vitals and nursing note reviewed.  Constitutional:      General: She is not in acute distress.    Appearance: Normal appearance. She is not ill-appearing or diaphoretic.  HENT:     Head: Normocephalic and atraumatic.     Comments: Long dark blonde hair.    Mouth/Throat:     Mouth: Mucous membranes are moist.     Pharynx: No oropharyngeal exudate or posterior oropharyngeal erythema.  Eyes:     General: No scleral icterus.    Conjunctiva/sclera: Conjunctivae normal.     Pupils: Pupils are equal, round, and reactive to light.     Comments: Blue eyes.  Cardiovascular:     Rate and Rhythm: Normal rate and regular rhythm.  Pulmonary:     Effort: Pulmonary effort is normal.     Breath sounds: Normal breath sounds. No wheezing, rhonchi or rales.  Chest:  Breasts:     Right: No axillary adenopathy or supraclavicular adenopathy.     Left: No  axillary adenopathy or supraclavicular adenopathy.    Abdominal:     General: Bowel sounds are normal. There is no distension.     Palpations: Abdomen is soft. There is no mass.     Tenderness: There is no abdominal tenderness.  Musculoskeletal:        General: No swelling.     Cervical back: Normal range of motion and neck supple.     Right lower leg: No edema.     Left lower leg: No edema.  Lymphadenopathy:     Head:     Right side of head: No preauricular, posterior auricular or occipital adenopathy.     Left side of head: No preauricular, posterior auricular or occipital adenopathy.     Cervical: No cervical adenopathy.     Upper Body:     Right upper body: No supraclavicular or axillary adenopathy.     Left upper body: No supraclavicular or axillary adenopathy.     Lower Body: No right inguinal adenopathy. No left inguinal adenopathy.  Skin:    General: Skin is warm and dry.  Neurological:     Mental Status: She is alert and oriented to person, place, and time.  Psychiatric:        Mood and Affect: Affect normal.        Behavior: Behavior normal.        Thought Content: Thought content normal.        Judgment: Judgment normal.    Appointment on 03/27/2020  Component Date Value Ref Range Status  . WBC 03/27/2020 8.8  4.0 - 10.5 K/uL Final  . RBC 03/27/2020 4.95  3.87 - 5.11 MIL/uL Final  . Hemoglobin 03/27/2020 12.3  12.0 - 15.0 g/dL Final  . HCT 03/27/2020 38.2  36.0 - 46.0 % Final  . MCV 03/27/2020 77.2* 80.0 - 100.0 fL Final  . MCH 03/27/2020 24.8* 26.0 - 34.0 pg Final  .  MCHC 03/27/2020 32.2  30.0 - 36.0 g/dL Final  . RDW 03/27/2020 14.9  11.5 - 15.5 % Final  . Platelets 03/27/2020 326  150 - 400 K/uL Final  . nRBC 03/27/2020 0.0  0.0 - 0.2 % Final  . Neutrophils Relative % 03/27/2020 65  % Final  . Neutro Abs 03/27/2020 5.7  1.7 - 7.7 K/uL Final  . Lymphocytes Relative 03/27/2020 26  % Final  . Lymphs Abs 03/27/2020 2.3  0.7 - 4.0 K/uL Final  . Monocytes  Relative 03/27/2020 7  % Final  . Monocytes Absolute 03/27/2020 0.7  0.1 - 1.0 K/uL Final  . Eosinophils Relative 03/27/2020 1  % Final  . Eosinophils Absolute 03/27/2020 0.1  0.0 - 0.5 K/uL Final  . Basophils Relative 03/27/2020 1  % Final  . Basophils Absolute 03/27/2020 0.1  0.0 - 0.1 K/uL Final  . Immature Granulocytes 03/27/2020 0  % Final  . Abs Immature Granulocytes 03/27/2020 0.03  0.00 - 0.07 K/uL Final   Performed at [facility], [address] [place], [place] [address]    Assessment:  Jo [name] is a 40 y.o. female with recurrent Hodgkin's lymphoma. She was initially diagnosed in 2001(age 19).  She had stage IIA disease (left neck and chest involvement only).  She received ABVD chemotherapy and modified mantle radiation.  She remained in remission until 2014.   She presented with a large multinodular goiter s/p multiple biopsies and high right cervical adenopathy in 2014.  She underwent total thyroidectomy and right neck lymph node excisional biopsy on 10/13/2012.  Pathology revealed recurrent classical Hodgkin's lymphoma.   Thyroid revealed multinodular hyperplasia with degenerative alterations and one parathyroid.    She received 2 cycles of BEACOPP chemotherapy beginning on 12/01/2012. She completed involved field radiation in 03/2013.  She received 3000 cGy to head and neck and upper supraclavicular nodes over 3 weeks.  PET scan in 08/2014 and 08/2015 were negative  Neck, chest, abdomen and pelvis CT on 07/25/2016 revealed no cervical lymphadenopathy or evidence of active lymphoma in the neck.  There were several subcentimeter nodes minimally increased since 2015, and attention to these was directed on follow-up.  There was incidental chronic ectopic thyroid parenchyma along the course of the thyroglossal duct.  There were several small sclerotic lesions in the T1 and T8 vertebral, not changed from 2015, and not recently hypermetabolic, c/w benign lesions or  conceivably successfully treated bony manifestation of lymphoma.  There was new peripheral triangular sub solid density in the left lower lobe, inflammatory etiology favored, merit surveillance on follow up studies. There was a 4 mm sub solid lesion in the left lower lobe which was new.  There was esophageal dysmotility vs gastroesophageal reflux.  There was thoracic spondylosis along with degenerative disc disease at L5-S1.  There was a stable right adrenal mass, not hypermetabolic on recent PET-CT, probably an adenoma or similar benign lesion.  There was a small mass along the right side of lower uterine segment, likely a fibroid.  Chest, abdomen, and pelvis CT with contrast on 09/08/2019 revealed no evidence of recurrent lymphoma. There was a chronically stable right adrenal nodule, indicative of an adenoma. There was coronary artery calcification.  Soft tissue neck CT with contrast on 09/08/2019 revealed no cervical lymphadenopathy. There was a previous thyroidectomy. There was a 9-10 mm nodule of residual ectopic thyroid in the midline just anterior to the inferior margin of the thyroid cartilage.  She has hypothyroidism s/p thyroidectomy and hypoparathyroidism  with resultant hypocalcemia.     She has iron deficiency anemia.  She began ferrous sulfate 325 mg a day 2 weeks ago.   Labs on 08/12/2019 included a hematocrit of 33.7, hemoglobin 10.0, MCV 66, platelets 385,000, WBC 11,800 with an ANC of 7800.  Creatinine was 0.7, calcium 8.1, albumin 4.7 with normal liver function tests.   LDH was 180.  TSH was 40.5.  Review of prior CBCs reveals a normal MCV in 2014 and 2015.  Screening mammogram on 09/08/2019 revealed no evidence of malignancy.  She had COVID-19 in mid 02/2020 and has not had an appetite since then.   Symptomatically, she has lost 7 pounds.  She had had weight loss since COVID-19 infection.  She has heavy menses.  Plan: 1.   Labs today: CBC with diff, ferritin, iron studies. 2.    Recurrent Hodgkin's lymphoma  She was diagnosed with stage IIA Hodgkn's in 2001.   She received ABVD chemotherapy and modified mantle radiation.  She developed right neck recurrence in 2014.   She received 2 cycles of BEACOPP followed by involved field radiation.  Neck, chest, abdomen, and pelvis CT in 07/2016 revealed   Subcentimeter nodes in the neck, sclerotic lesions in T1 and T8, a subsolid trinagular lesion in LLL and a new subsolid lesion in LLL.  Soft tissue neck, chest, abdomen and pelvis CT scan on 09/08/2019 revealed no evidence of recurrent disease.   There is a 9-10 mm nodule of residual ectopic thyroid in the midline just anterior to the inferior margin of the thyroid cartilage.  Clinically, she is doing well.  She has had no B symptoms; weight loss is felt secondary to COVID-19.  Exam reveals no adenopathy or hepatosplenomegaly.  Continue surveillance. 3.   Iron deficiency anemia  Hematocrit 33.7.  Hemoglobin 10.0.  MCV 66.0 on 08/12/2019.  Hematocrit 37.9.  Hemoglobin 11.6.  MCV 68.0 on 09/28/2019.   Ferritin 13 with an iron saturation of 9% and a TIBC of 389.    Hmeatocrit 38.2.  Hemoglobin 12.3.  MCV 77.2 on 03/27/2020.   Ferritin 16 with an iron saturation of 11 % and a TIBC of 350.    She denies any bleeding except for heavy menses.    She notes use of Aleve a few times a week as well as Excedrin.   She denies any excess bruising.   Discuss follow-up with Rod Can, CNM in gynecology- note sent.  Patient continues daily oral iron with orange juice.  Patient on ferrous sulfate 325 mg a day.  Current plan for IV iron unless patient anemic or symptomatic.  Continue to monitor. 4.   Long term follow-up  Patient aware of importance of annual breast screening s/p mantle radiation.  NCCN recommendations are to initiate 8-10 years s/p radiation or at age 42 (whichever comes first).  NCCN Hodgkin Lymphoma Guidelines Panel and the ACS recommends breast MRI in addition to  mammography for:   Women who received XRT to the chest between ages 20-30.  Screening mammogram on 09/08/2019 was negative for malignancy.  Discuss ongoing plan for breast MRI every 6 months alternating with mammogram.  Interval bilateral breast MRI was canceled secondary to insurance- appeal. 5.   Weight loss   Etiology felt secondary to interval COVID-19.  Recent TSH was normal per patient.  Continue to monitor. 6.   Await preauth for breast MRI. 7.   Bilateral mammogram on 09/07/2020. 8.   RTC in 3 months for MD assessment, labs (CBC  with diff, CMP, ferritin, iron studies), and review of breast MRI.  I discussed the assessment and treatment plan with the patient.  The patient was provided an opportunity to ask questions and all were answered.  The patient agreed with the plan and demonstrated an understanding of the instructions.  The patient was advised to call back if the symptoms worsen or if the condition fails to improve as anticipated.  I provided 19 minutes of face-to-face time during this this encounter and > 50% was spent counseling as documented under my assessment and plan.  An additional 5 minutes were spent reviewing his chart (Epic and Care Everywhere) including notes, labs, and imaging studies.    Evert Kohl, PhD 04-06-20, 3:28 PM   I, Mirian Mo Tufford, am acting as Education administrator for [employer]. Mike Gip, MD, PhD.  I, Najia Hurlbutt C. Mike Gip, MD, have reviewed the above documentation for accuracy and completeness, and I agree with the above.

## 2020-03-27 ENCOUNTER — Inpatient Hospital Stay: Payer: 59 | Attending: Hematology and Oncology | Admitting: Hematology and Oncology

## 2020-03-27 ENCOUNTER — Inpatient Hospital Stay: Payer: 59

## 2020-03-27 ENCOUNTER — Other Ambulatory Visit: Payer: Self-pay

## 2020-03-27 ENCOUNTER — Encounter: Payer: Self-pay | Admitting: Hematology and Oncology

## 2020-03-27 VITALS — BP 133/84 | HR 92 | Temp 98.2°F | Wt 224.4 lb

## 2020-03-27 DIAGNOSIS — C8198 Hodgkin lymphoma, unspecified, lymph nodes of multiple sites: Secondary | ICD-10-CM | POA: Diagnosis not present

## 2020-03-27 DIAGNOSIS — Z923 Personal history of irradiation: Secondary | ICD-10-CM | POA: Diagnosis not present

## 2020-03-27 DIAGNOSIS — D509 Iron deficiency anemia, unspecified: Secondary | ICD-10-CM | POA: Diagnosis not present

## 2020-03-27 DIAGNOSIS — N920 Excessive and frequent menstruation with regular cycle: Secondary | ICD-10-CM | POA: Insufficient documentation

## 2020-03-27 DIAGNOSIS — Z9221 Personal history of antineoplastic chemotherapy: Secondary | ICD-10-CM | POA: Diagnosis not present

## 2020-03-27 DIAGNOSIS — E890 Postprocedural hypothyroidism: Secondary | ICD-10-CM | POA: Diagnosis not present

## 2020-03-27 DIAGNOSIS — Z8571 Personal history of Hodgkin lymphoma: Secondary | ICD-10-CM | POA: Insufficient documentation

## 2020-03-27 DIAGNOSIS — R634 Abnormal weight loss: Secondary | ICD-10-CM | POA: Diagnosis not present

## 2020-03-27 DIAGNOSIS — Z8616 Personal history of COVID-19: Secondary | ICD-10-CM | POA: Insufficient documentation

## 2020-03-27 LAB — IRON AND TIBC
Iron: 39 ug/dL (ref 28–170)
Saturation Ratios: 11 % (ref 10.4–31.8)
TIBC: 350 ug/dL (ref 250–450)
UIBC: 311 ug/dL

## 2020-03-27 LAB — CBC WITH DIFFERENTIAL/PLATELET
Abs Immature Granulocytes: 0.03 10*3/uL (ref 0.00–0.07)
Basophils Absolute: 0.1 10*3/uL (ref 0.0–0.1)
Basophils Relative: 1 %
Eosinophils Absolute: 0.1 10*3/uL (ref 0.0–0.5)
Eosinophils Relative: 1 %
HCT: 38.2 % (ref 36.0–46.0)
Hemoglobin: 12.3 g/dL (ref 12.0–15.0)
Immature Granulocytes: 0 %
Lymphocytes Relative: 26 %
Lymphs Abs: 2.3 10*3/uL (ref 0.7–4.0)
MCH: 24.8 pg — ABNORMAL LOW (ref 26.0–34.0)
MCHC: 32.2 g/dL (ref 30.0–36.0)
MCV: 77.2 fL — ABNORMAL LOW (ref 80.0–100.0)
Monocytes Absolute: 0.7 10*3/uL (ref 0.1–1.0)
Monocytes Relative: 7 %
Neutro Abs: 5.7 10*3/uL (ref 1.7–7.7)
Neutrophils Relative %: 65 %
Platelets: 326 10*3/uL (ref 150–400)
RBC: 4.95 MIL/uL (ref 3.87–5.11)
RDW: 14.9 % (ref 11.5–15.5)
WBC: 8.8 10*3/uL (ref 4.0–10.5)
nRBC: 0 % (ref 0.0–0.2)

## 2020-03-27 LAB — FERRITIN: Ferritin: 16 ng/mL (ref 11–307)

## 2020-03-28 ENCOUNTER — Telehealth: Payer: Self-pay

## 2020-03-28 NOTE — Telephone Encounter (Signed)
Patient did not answer and I left a voicemail stating that her ferritin was at 16 and her goal is 100 and per dr.corcoran to continue oral iron.

## 2020-03-29 ENCOUNTER — Telehealth: Payer: Self-pay | Admitting: Hematology and Oncology

## 2020-03-29 NOTE — Telephone Encounter (Signed)
03/29/2020 Left VM for pt informing her that Bilateral Breast MRI has been authorized by her insurance, per [employer] Spoke with Aldona Bar at [facility] and pt is now scheduled for 04/10/20 @  8 am. Left appt details in the VM and callback number in case she has any questions/concerns  SRW

## 2020-04-07 ENCOUNTER — Encounter: Payer: Self-pay | Admitting: Hematology and Oncology

## 2020-04-10 ENCOUNTER — Ambulatory Visit: Payer: 59

## 2020-04-21 ENCOUNTER — Encounter: Payer: Self-pay | Admitting: Family Medicine

## 2020-04-21 ENCOUNTER — Other Ambulatory Visit: Payer: Self-pay | Admitting: Family Medicine

## 2020-04-21 DIAGNOSIS — E890 Postprocedural hypothyroidism: Secondary | ICD-10-CM

## 2020-05-22 ENCOUNTER — Other Ambulatory Visit: Payer: Self-pay | Admitting: Family Medicine

## 2020-05-22 DIAGNOSIS — E890 Postprocedural hypothyroidism: Secondary | ICD-10-CM

## 2020-05-22 NOTE — Telephone Encounter (Signed)
Requested medications are due for refill today. yes  Requested medications are on the active medications list.  yes  Last refill. 04/21/2020  Future visit scheduled.   no  Notes to clinic.  Per note of 03/22/2020, pt should have repeated labs 04/19/2020

## 2020-06-23 ENCOUNTER — Other Ambulatory Visit: Payer: Self-pay

## 2020-06-23 DIAGNOSIS — C8198 Hodgkin lymphoma, unspecified, lymph nodes of multiple sites: Secondary | ICD-10-CM

## 2020-06-26 ENCOUNTER — Ambulatory Visit: Payer: 59 | Admitting: Hematology and Oncology

## 2020-06-26 ENCOUNTER — Other Ambulatory Visit: Payer: 59

## 2020-06-27 ENCOUNTER — Ambulatory Visit: Payer: 59 | Admitting: Nurse Practitioner

## 2020-06-27 ENCOUNTER — Other Ambulatory Visit: Payer: 59

## 2020-06-29 ENCOUNTER — Other Ambulatory Visit: Payer: Self-pay

## 2020-06-29 ENCOUNTER — Ambulatory Visit: Payer: 59 | Admitting: Family Medicine

## 2020-06-29 ENCOUNTER — Encounter: Payer: Self-pay | Admitting: Family Medicine

## 2020-06-29 ENCOUNTER — Other Ambulatory Visit: Payer: Self-pay | Admitting: Family Medicine

## 2020-06-29 VITALS — BP 138/84 | HR 92 | Ht 66.0 in | Wt 212.0 lb

## 2020-06-29 DIAGNOSIS — E890 Postprocedural hypothyroidism: Secondary | ICD-10-CM | POA: Diagnosis not present

## 2020-06-29 MED ORDER — LEVOTHYROXINE SODIUM 200 MCG PO TABS: ORAL_TABLET | ORAL | 1 refills | Status: DC

## 2020-06-29 NOTE — Telephone Encounter (Signed)
Requested medication (s) are due for refill today: yes  Requested medication (s) are on the active medication list: yes  Last refill:  05/23/20 #30  Future visit scheduled: no  Notes to clinic:  Please review for refill. Unable to refill per protocol. Patient due for labs    Requested Prescriptions  Pending Prescriptions Disp Refills   levothyroxine (SYNTHROID) 200 MCG tablet [Pharmacy Med Name: LEVOTHYROXINE 0.2MG  (200MCG) TAB] 30 tablet 0    Sig: TAKE 1 TABLET(200 MCG) BY MOUTH DAILY BEFORE AND BREAKFAST      Endocrinology:  Hypothyroid Agents Failed - 06/29/2020  8:09 AM      Failed - TSH needs to be rechecked within 3 months after an abnormal result. Refill until TSH is due.      Failed - TSH in normal range and within 360 days    TSH  Date Value Ref Range Status  03/21/2020 4.740 (H) 0.450 - 4.500 uIU/mL Final          Passed - Valid encounter within last 12 months    Recent Outpatient Visits           6 months ago Post-surgical hypothyroidism   [facility] Juline Patch, MD   9 months ago Major depression, recurrent, chronic [facility])   [facility] Juline Patch, MD   10 months ago Establishing care with new doctor, encounter for   [place], MD   3 years ago Acquired hypothyroidism   [facility] Steele Sizer, MD   4 years ago Acquired hypothyroidism   [facility] Steele Sizer, MD

## 2020-06-29 NOTE — Telephone Encounter (Signed)
Left voicemail to call back to set up appointment for thyroid visit and labs.

## 2020-06-29 NOTE — Telephone Encounter (Signed)
Please advise 

## 2020-06-29 NOTE — Progress Notes (Addendum)
Date:  06/29/2020   Name:  Jo [name]   DOB:  1980-10-07   MRN:  [id number]   Chief Complaint: Hypothyroidism  Thyroid Problem Presents for follow-up visit. Symptoms include dry skin, hair loss, nail problem and weight loss. Patient reports no anxiety, cold intolerance, constipation, depressed mood, diaphoresis, diarrhea, fatigue, heat intolerance, hoarse voice, menstrual problem, palpitations, tremors, visual change or weight gain. The symptoms have been stable.    Lab Results  Component Value Date   CREATININE 0.74 12/23/2019   BUN 19 12/23/2019   NA 141 12/23/2019   K 5.1 12/23/2019   CL 96 12/23/2019   CO2 23 12/23/2019   Lab Results  Component Value Date   CHOL 170 04/19/2016   HDL 51 04/19/2016   LDLCALC 96 04/19/2016   TRIG 113 04/19/2016   CHOLHDL 3.3 04/19/2016   Lab Results  Component Value Date   TSH 4.740 (H) 03/21/2020   Lab Results  Component Value Date   HGBA1C 5.4 04/19/2016   Lab Results  Component Value Date   WBC 8.8 03/27/2020   HGB 12.3 03/27/2020   HCT 38.2 03/27/2020   MCV 77.2 (L) 03/27/2020   PLT 326 03/27/2020   Lab Results  Component Value Date   ALT 12 08/12/2019   AST 12 08/12/2019   ALKPHOS 68 08/12/2019   BILITOT <0.2 08/12/2019     Review of Systems  Constitutional: Positive for weight loss. Negative for diaphoresis, fatigue and weight gain.  HENT: Negative for hoarse voice.   Cardiovascular: Negative for palpitations.  Gastrointestinal: Negative for constipation and diarrhea.  Endocrine: Negative for cold intolerance and heat intolerance.  Genitourinary: Negative for menstrual problem.  Neurological: Negative for tremors.  Psychiatric/Behavioral: The patient is not nervous/anxious.     Patient Active Problem List   Diagnosis Date Noted  . Anxiety 08/31/2019  . Multinodular goiter 08/31/2019  . Iron deficiency anemia 08/31/2019  . Prediabetes 12/06/2016  . Recurrent major depressive disorder, in partial  remission ([place]) 12/05/2016  . Tension type headache 04/27/2015  . Hypocalcemia 04/17/2015  . Hypoparathyroidism ([place]) 04/17/2015  . GAD (generalized anxiety disorder) 10/18/2014  . RLS (restless legs syndrome) 10/18/2014  . Insomnia, persistent 10/18/2014  . Migraine without aura and responsive to treatment 10/18/2014  . CN (constipation) 10/18/2014  . Major depression, recurrent, chronic ([place]) 10/18/2014  . Dysmenorrhea 10/18/2014  . Female infertility 10/18/2014  . Gastro-esophageal reflux disease without esophagitis 10/18/2014  . Allergy, food 10/18/2014  . Hodgkin lymphoma, unspecified, unspecified site ([place]) 10/18/2014  . H/O thyroidectomy 10/18/2014  . Metabolic syndrome 02/54/2706  . Morbid obesity, unspecified obesity type ([place]) 10/18/2014  . Low parathyroid level after removal 10/18/2014  . Vitamin D deficiency 10/18/2014  . Hive 10/18/2014  . Dermatitis seborrheica 10/18/2014  . Arthralgia of temporomandibular joint 10/18/2014  . Post-surgical hypothyroidism 10/13/2012    Allergies  Allergen Reactions  . Tape Rash    Uncoded Allergy. Allergen: Msg  . Barium-Containing Compounds Hives    Oral contrast  . Monosodium Glutamate     Unknown.  . Other Rash and Other (See Comments)    Contrast dye Uncoded Allergy. Allergen: Msg Contrast dye  Pt states she has no Iodine or Omni IV contrast reaction. She is only allergic to Barium Oral contrast. ASW 09/08/19     Past Surgical History:  Procedure Laterality Date  . lymphonodi biopsy    . PORT-A-CATH REMOVAL  2004  . PORTACATH PLACEMENT  11-24-12  . THYROID SURGERY    .  THYROIDECTOMY  10-13-12  . uterine poly removal Left 08/2010   [facility]    Social History   Tobacco Use  . Smoking status: Former Smoker    Packs/day: 1.00    Years: 0.00    Pack years: 0.00    Types: Cigarettes    Quit date: 07/27/2009    Years since quitting: 10.9  . Smokeless tobacco: Never Used  Vaping Use  . Vaping Use: Never used   Substance Use Topics  . Alcohol use: Yes    Alcohol/week: 0.0 standard drinks    Comment: rarely  . Drug use: No     Medication list has been reviewed and updated.  Current Meds  Medication Sig  . Ascorbic Acid (VITAMIN C PO) Take by mouth.  . calcitRIOL (ROCALTROL) 0.5 MCG capsule Take 1 capsule (0.5 mcg total) by mouth 2 (two) times daily.  . Calcium Citrate 333 MG TABS   . EPINEPHrine 0.3 mg/0.3 mL IJ SOAJ injection   . ferrous sulfate 325 (65 FE) MG EC tablet Take 1 tablet (325 mg total) by mouth every morning.  . fexofenadine (ALLEGRA) 180 MG tablet Take by mouth.  . hydrocortisone cream 0.5 %   . hydrOXYzine (ATARAX/VISTARIL) 25 MG tablet Take 1 tablet (25 mg total) by mouth every 6 (six) hours as needed.  Marland Kitchen levothyroxine (SYNTHROID) 200 MCG tablet TAKE 1 TABLET(200 MCG) BY MOUTH DAILY BEFORE AND BREAKFAST  . Multiple Vitamin (MULTIVITAMIN ADULT PO) Take by mouth.  Marland Kitchen omeprazole (PRILOSEC OTC) 20 MG tablet Take by mouth.    PHQ 2/9 Scores 06/29/2020 12/23/2019 09/09/2019 08/12/2019  PHQ - 2 Score 0 0 2 0  PHQ- 9 Score 0 0 4 1  Exception Documentation - - - -  Not completed - - - -    GAD 7 : Generalized Anxiety Score 06/29/2020 12/23/2019 09/09/2019 08/12/2019  Nervous, Anxious, on Edge 0 0 1 1  Control/stop worrying 0 0 0 2  Worry too much - different things 0 0 0 2  Trouble relaxing 0 0 0 1  Restless 0 0 0 0  Easily annoyed or irritable 0 0 1 1  Afraid - awful might happen 0 0 1 0  Total GAD 7 Score 0 0 3 7  Anxiety Difficulty - - Not difficult at all Not difficult at all    BP Readings from Last 3 Encounters:  06/29/20 138/84  03/27/20 133/84  01/05/20 134/78    Physical Exam Vitals and nursing note reviewed.  Constitutional:      Appearance: She is well-developed.  HENT:     Head: Normocephalic.     Right Ear: Tympanic membrane, ear canal and external ear normal.     Left Ear: Tympanic membrane, ear canal and external ear normal.     Nose: Nose normal.      Mouth/Throat:     Mouth: Mucous membranes are moist.  Eyes:     General: Lids are everted, no foreign bodies appreciated. No scleral icterus.       Left eye: No foreign body or hordeolum.     Conjunctiva/sclera: Conjunctivae normal.     Right eye: Right conjunctiva is not injected.     Left eye: Left conjunctiva is not injected.     Pupils: Pupils are equal, round, and reactive to light.  Neck:     Thyroid: No thyromegaly.     Vascular: No JVD.     Trachea: No tracheal deviation.  Cardiovascular:     Rate and  Rhythm: Normal rate and regular rhythm.     Heart sounds: Normal heart sounds. No murmur heard. No friction rub. No gallop.   Pulmonary:     Effort: Pulmonary effort is normal. No respiratory distress.     Breath sounds: Normal breath sounds. No wheezing, rhonchi or rales.  Abdominal:     General: Bowel sounds are normal.     Palpations: Abdomen is soft. There is no mass.     Tenderness: There is no abdominal tenderness. There is no guarding or rebound.  Musculoskeletal:        General: No tenderness. Normal range of motion.     Cervical back: Normal range of motion and neck supple.  Lymphadenopathy:     Cervical: No cervical adenopathy.  Skin:    General: Skin is warm.     Findings: No rash.  Neurological:     Mental Status: She is alert and oriented to person, place, and time.     Cranial Nerves: No cranial nerve deficit.     Deep Tendon Reflexes: Reflexes normal.  Psychiatric:        Mood and Affect: Mood is not anxious or depressed.     Wt Readings from Last 3 Encounters:  06/29/20 212 lb (96.2 kg)  03/27/20 224 lb 6.9 oz (101.8 kg)  01/05/20 234 lb (106.1 kg)    BP 138/84   Pulse 92   Ht 5\' 6"  (1.676 m)   Wt 212 lb (96.2 kg)   LMP 06/05/2020   BMI 34.22 kg/m   Assessment and Plan:  1. Post-surgical hypothyroidism Chronic.  Controlled.  Stable.  We will check a TSH and pending results will consider continuance of levothyroxine 200 mcg daily. -  TSH - levothyroxine (SYNTHROID) 200 MCG tablet; TAKE 1 TABLET(200 MCG) BY MOUTH DAILY BEFORE AND BREAKFAST  Dispense: 90 tablet; Refill: 1

## 2020-06-29 NOTE — Patient Instructions (Signed)

## 2020-06-30 ENCOUNTER — Other Ambulatory Visit: Payer: Self-pay

## 2020-06-30 DIAGNOSIS — E890 Postprocedural hypothyroidism: Secondary | ICD-10-CM

## 2020-06-30 LAB — TSH: TSH: 5.23 u[IU]/mL — ABNORMAL HIGH (ref 0.450–4.500)

## 2020-06-30 MED ORDER — LEVOTHYROXINE SODIUM 25 MCG PO TABS: 25.0000 ug | ORAL_TABLET | Freq: Every day | ORAL | 1 refills | Status: DC

## 2020-06-30 MED ORDER — LEVOTHYROXINE SODIUM 200 MCG PO TABS: ORAL_TABLET | ORAL | 1 refills | Status: DC

## 2020-06-30 NOTE — Progress Notes (Unsigned)
Sent in 200 plus 25=225

## 2020-07-04 ENCOUNTER — Inpatient Hospital Stay: Payer: 59 | Attending: Oncology | Admitting: Oncology

## 2020-07-04 ENCOUNTER — Encounter: Payer: Self-pay | Admitting: Oncology

## 2020-07-04 ENCOUNTER — Inpatient Hospital Stay (HOSPITAL_BASED_OUTPATIENT_CLINIC_OR_DEPARTMENT_OTHER): Payer: 59 | Admitting: Oncology

## 2020-07-04 ENCOUNTER — Other Ambulatory Visit: Payer: Self-pay

## 2020-07-04 VITALS — BP 145/83 | HR 89 | Temp 98.6°F | Resp 16 | Wt 216.8 lb

## 2020-07-04 DIAGNOSIS — D509 Iron deficiency anemia, unspecified: Secondary | ICD-10-CM | POA: Insufficient documentation

## 2020-07-04 DIAGNOSIS — Z923 Personal history of irradiation: Secondary | ICD-10-CM | POA: Diagnosis not present

## 2020-07-04 DIAGNOSIS — E042 Nontoxic multinodular goiter: Secondary | ICD-10-CM | POA: Insufficient documentation

## 2020-07-04 DIAGNOSIS — Z79899 Other long term (current) drug therapy: Secondary | ICD-10-CM | POA: Insufficient documentation

## 2020-07-04 DIAGNOSIS — C8198 Hodgkin lymphoma, unspecified, lymph nodes of multiple sites: Secondary | ICD-10-CM

## 2020-07-04 DIAGNOSIS — Z8571 Personal history of Hodgkin lymphoma: Secondary | ICD-10-CM | POA: Diagnosis not present

## 2020-07-04 DIAGNOSIS — F17210 Nicotine dependence, cigarettes, uncomplicated: Secondary | ICD-10-CM | POA: Insufficient documentation

## 2020-07-04 LAB — CBC WITH DIFFERENTIAL/PLATELET
Abs Immature Granulocytes: 0.03 10*3/uL (ref 0.00–0.07)
Basophils Absolute: 0.1 10*3/uL (ref 0.0–0.1)
Basophils Relative: 1 %
Eosinophils Absolute: 0.3 10*3/uL (ref 0.0–0.5)
Eosinophils Relative: 4 %
HCT: 36.8 % (ref 36.0–46.0)
Hemoglobin: 11.9 g/dL — ABNORMAL LOW (ref 12.0–15.0)
Immature Granulocytes: 0 %
Lymphocytes Relative: 23 %
Lymphs Abs: 2 10*3/uL (ref 0.7–4.0)
MCH: 25.2 pg — ABNORMAL LOW (ref 26.0–34.0)
MCHC: 32.3 g/dL (ref 30.0–36.0)
MCV: 78 fL — ABNORMAL LOW (ref 80.0–100.0)
Monocytes Absolute: 0.6 10*3/uL (ref 0.1–1.0)
Monocytes Relative: 7 %
Neutro Abs: 5.8 10*3/uL (ref 1.7–7.7)
Neutrophils Relative %: 65 %
Platelets: 338 10*3/uL (ref 150–400)
RBC: 4.72 MIL/uL (ref 3.87–5.11)
RDW: 14 % (ref 11.5–15.5)
WBC: 8.9 10*3/uL (ref 4.0–10.5)
nRBC: 0 % (ref 0.0–0.2)

## 2020-07-04 LAB — COMPREHENSIVE METABOLIC PANEL
ALT: 13 U/L (ref 0–44)
AST: 16 U/L (ref 15–41)
Albumin: 4 g/dL (ref 3.5–5.0)
Alkaline Phosphatase: 45 U/L (ref 38–126)
Anion gap: 8 (ref 5–15)
BUN: 12 mg/dL (ref 6–20)
CO2: 30 mmol/L (ref 22–32)
Calcium: 7.8 mg/dL — ABNORMAL LOW (ref 8.9–10.3)
Chloride: 99 mmol/L (ref 98–111)
Creatinine, Ser: 0.68 mg/dL (ref 0.44–1.00)
GFR, Estimated: >60 mL/min (ref >60)
Glucose, Bld: 94 mg/dL (ref 70–99)
Potassium: 3.5 mmol/L (ref 3.5–5.1)
Sodium: 137 mmol/L (ref 135–145)
Total Bilirubin: 0.3 mg/dL (ref 0.3–1.2)
Total Protein: 7.5 g/dL (ref 6.5–8.1)

## 2020-07-04 LAB — IRON AND TIBC
Iron: 27 ug/dL — ABNORMAL LOW (ref 28–170)
Saturation Ratios: 7 % — ABNORMAL LOW (ref 10.4–31.8)
TIBC: 363 ug/dL (ref 250–450)
UIBC: 336 ug/dL

## 2020-07-04 LAB — FERRITIN: Ferritin: 12 ng/mL (ref 11–307)

## 2020-07-04 NOTE — Progress Notes (Signed)
[facility]  [address] [place],  [address] Phone: [phone]  Fax: [phone]   Clinic Day: 07/04/20  Referring physician: Juline Patch, MD  Chief Complaint: Jo [name] is a 40 y.o. female with a history of recurrent Hodgkin's disease who is seen for 6 month assessment.   HPI: Patient was last seen in clinic on 03/27/2020.  She had a bilateral mammogram on 09/08/2019 which was reported as BI-RADS Category 1 negative.  She did not have her MRI completed due to change in insurance and it is no longer covered.  In the interim, she has done well.  Reports persistent heavy menstrual cycles that last several days.  She is currently on her cycle.  She is currently stable and tolerating her iron tablets.   She recently acquired custody of her 48 year old niece.  Things are going well.  She denies any new B symptoms.  Past Medical History:  Diagnosis Date  . Anxiety   . Constipation   . Cramp of limb   . Depression   . GERD (gastroesophageal reflux disease)   . Hives   . Hodgkin disease ([place]) 2001 and 2014   chemo and rad tx in 2001 and 2014  . Hodgkin's disease in remission ([place])   . Hyperlipidemia   . Insomnia   . Lymphoma, Hodgkin's ([place])    2001, 2014  . Migraine   . Obesity   . Oligomenorrhea   . Personal history of chemotherapy   . Personal history of radiation therapy   . Primary female infertility   . Screening mammogram for high-risk patient   . Seborrhea   . Snoring   . Thyroid disease   . TMJ (dislocation of temporomandibular joint)   . Vitamin D deficiency     Past Surgical History:  Procedure Laterality Date  . lymphonodi biopsy    . PORT-A-CATH REMOVAL  2004  . PORTACATH PLACEMENT  11-24-12  . THYROID SURGERY    . THYROIDECTOMY  10-13-12  . uterine poly removal Left 08/2010   [facility]    Family History  Problem Relation Age of Onset  . Hypertension Mother   . Liver disease Mother   . Cancer  Mother        breast cancer, cervical cancer  . Breast cancer Mother 50  . Diabetes Father   . Heart disease Father   . CAD Father   . Sudden Cardiac Death Father   . Coronary artery disease Father   . Diabetes Brother   . Breast cancer Paternal Aunt        >50  . Breast cancer Cousin   . Asthma Cousin        54s  . CAD Maternal Grandmother   . Parkinson's disease Maternal Grandmother   . Coronary artery disease Maternal Grandmother   . Parkinson's disease Maternal Grandfather   . Coronary artery disease Maternal Grandfather   . CAD Paternal Grandmother   . Coronary artery disease Paternal Grandmother   . Diabetes Paternal Grandmother   . Breast cancer Maternal Aunt        late years  . Coronary artery disease Paternal Grandfather   . Diabetes Paternal Grandfather     Social History:  reports that she quit smoking about 10 years ago. Her smoking use included cigarettes. She smoked 1.00 pack per day for 0.00 years. She has never used smokeless tobacco. She reports current alcohol use. She reports that she does not use drugs.  She lives in [place]. She has 5 dogs. 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 The patient is alone today.   Allergies:  Allergies  Allergen Reactions  . Tape Rash    Uncoded Allergy. Allergen: Msg  . Barium-Containing Compounds Hives    Oral contrast  . Monosodium Glutamate     Unknown.  . Other Rash and Other (See Comments)    Contrast dye Uncoded Allergy. Allergen: Msg Contrast dye  Pt states she has no Iodine or Omni IV contrast reaction. She is only allergic to Barium Oral contrast. ASW 09/08/19     Current Medications: Current Outpatient Medications  Medication Sig Dispense Refill  . Ascorbic Acid (VITAMIN C PO) Take by mouth.    . calcitRIOL (ROCALTROL) 0.5 MCG capsule Take 1 capsule (0.5 mcg total) by mouth 2 (two) times daily. 180 capsule 1  . Calcium Citrate 333 MG TABS     . EPINEPHrine  0.3 mg/0.3 mL IJ SOAJ injection     . ferrous sulfate 325 (65 FE) MG EC tablet Take 1 tablet (325 mg total) by mouth every morning. 30 tablet 0  . fexofenadine (ALLEGRA) 180 MG tablet Take by mouth.    . hydrocortisone cream 0.5 %     . hydrOXYzine (ATARAX/VISTARIL) 25 MG tablet Take 1 tablet (25 mg total) by mouth every 6 (six) hours as needed. 30 tablet 0  . levothyroxine (SYNTHROID) 200 MCG tablet TAKE 1 TABLET(200 MCG) BY MOUTH DAILY BEFORE AND BREAKFAST 30 tablet 1  . levothyroxine (SYNTHROID) 25 MCG tablet Take 1 tablet (25 mcg total) by mouth daily. 30 tablet 1  . Multiple Vitamin (MULTIVITAMIN ADULT PO) Take by mouth.    Marland Kitchen omeprazole (PRILOSEC OTC) 20 MG tablet Take by mouth.     No current facility-administered medications for this visit.    Review of Systems  Constitutional: Negative.  Negative for chills, fever, malaise/fatigue and weight loss.  HENT: Negative for congestion, ear pain and tinnitus.   Eyes: Negative.  Negative for blurred vision and double vision.  Respiratory: Negative.  Negative for cough, sputum production and shortness of breath.   Cardiovascular: Negative.  Negative for chest pain, palpitations and leg swelling.  Gastrointestinal: Negative.  Negative for abdominal pain, constipation, diarrhea, nausea and vomiting.  Genitourinary: Negative for dysuria, frequency and urgency.  Musculoskeletal: Negative for back pain and falls.  Skin: Negative.  Negative for rash.  Neurological: Negative.  Negative for weakness and headaches.  Endo/Heme/Allergies: Negative.  Does not bruise/bleed easily.  Psychiatric/Behavioral: Negative.  Negative for depression. The patient is not nervous/anxious and does not have insomnia.    Performance status (ECOG): 0  Vitals Last menstrual period 06/05/2020.   Physical Exam Vitals and nursing note reviewed.  Constitutional:      General: She is not in acute distress.    Appearance: Normal appearance. She is not ill-appearing or  diaphoretic.  HENT:     Head: Normocephalic and atraumatic.     Comments: Long dark blonde hair.    Mouth/Throat:     Mouth: Mucous membranes are moist.     Pharynx: No oropharyngeal exudate or posterior oropharyngeal erythema.  Eyes:     General: No scleral icterus.    Conjunctiva/sclera: Conjunctivae normal.     Pupils: Pupils are equal, round, and reactive to light.     Comments: Blue eyes.  Cardiovascular:     Rate and Rhythm: Normal rate and regular rhythm.  Pulmonary:     Effort: Pulmonary effort is normal.     Breath sounds: Normal breath sounds. No wheezing, rhonchi or rales.  Chest:  Breasts:     Right: No axillary adenopathy or supraclavicular adenopathy.     Left: No axillary adenopathy or supraclavicular adenopathy.    Abdominal:     General: Bowel sounds are normal. There is no distension.     Palpations: Abdomen is soft. There is no mass.     Tenderness: There is no abdominal tenderness.  Musculoskeletal:        General: No swelling.     Cervical back: Normal range of motion and neck supple.     Right lower leg: No edema.     Left lower leg: No edema.  Lymphadenopathy:     Head:     Right side of head: No preauricular, posterior auricular or occipital adenopathy.     Left side of head: No preauricular, posterior auricular or occipital adenopathy.     Cervical: No cervical adenopathy.     Upper Body:     Right upper body: No supraclavicular or axillary adenopathy.     Left upper body: No supraclavicular or axillary adenopathy.     Lower Body: No right inguinal adenopathy. No left inguinal adenopathy.  Skin:    General: Skin is warm and dry.  Neurological:     Mental Status: She is alert and oriented to person, place, and time.  Psychiatric:        Mood and Affect: Affect normal.        Behavior: Behavior normal.        Thought Content: Thought content normal.        Judgment: Judgment normal.    No visits with results within 3 Day(s) from this visit.   Latest known visit with results is:  Office Visit on 06/29/2020  Component Date Value Ref Range Status  . TSH 06/29/2020 5.230* 0.450 - 4.500 uIU/mL Final    Assessment:  Jo [name] is a 40 y.o. female with recurrent Hodgkin's lymphoma. She was initially diagnosed in 2001(age 78).  She had stage IIA disease (left neck and chest involvement only).  She received ABVD chemotherapy and modified mantle radiation.  She remained in remission until 2014.   She presented with a large multinodular goiter s/p multiple biopsies and high right cervical adenopathy in 2014.  She underwent total thyroidectomy and right neck lymph node excisional biopsy on 10/13/2012.  Pathology revealed recurrent classical Hodgkin's lymphoma.   Thyroid revealed multinodular hyperplasia with degenerative alterations and one parathyroid.    She received 2 cycles of BEACOPP chemotherapy beginning on 12/01/2012. She completed involved field radiation in 03/2013.  She received 3000 cGy to head and neck and upper supraclavicular nodes over 3 weeks.  PET scan in 08/2014 and 08/2015 were negative  Symptomatically, she is doing well.  Overall stable.  Plan: Recurrent Hodgkin's lymphoma She was diagnosed with stage IIA Hodgkn's in 2001. She received ABVD chemotherapy and modified mantle radiation. She developed right neck recurrence in 2014. She received 2 cycles of BEACOPP followed by involved field radiation. Neck, chest, abdomen, and pelvis CT in 07/2016 revealed Subcentimeter nodes in the neck, sclerotic lesions in T1 and T8, a subsolid trinagular lesion in LLL and a new subsolid lesion in LLL. Soft tissue neck, chest, abdomen and pelvis CT scan on 09/08/2019 revealed no evidence of recurrent disease. Clinically, she is doing well.  She has had no B symptoms Continue  surveillance.  Iron deficiency anemia Labs from 07/04/2020 show a hemoglobin of 11.9.  Ferritin is pending during dictation. She denies any bleeding except  for heavy menses.  She notes use of Aleve a few times a week as well as Excedrin. She denies any excess bruising. Patient continues daily oral iron with orange juice. Patient on ferrous sulfate 325 mg a day. Plan is to continue oral iron unless she becomes symptomatic or has significant anemia. Continue to monitor.  Long term follow-up Patient aware of importance of annual breast screening s/p mantle radiation. NCCN recommendations are to initiate 8-10 years s/p radiation or at age 35 (whichever comes first). NCCN Hodgkin Lymphoma Guidelines Panel and the ACS recommends breast MRI in addition to mammography for: Women who received XRT to the chest between ages 56-30. Screening mammogram on 09/08/2019 was negative for malignancy. Patient is unable to afford breast MRI every 6 months alternating with mammogram.   She is agreeable to annual screening mammograms. Orders placed for repeat mammogram in July 2022.  Disposition: Bilateral mammogram on 09/07/2020. RTC in 3 months for MD assessment, labs (CBC with diff, CMP, ferritin, iron studies), and review of breast MRI.  I discussed the assessment and treatment plan with the patient.  The patient was provided an opportunity to ask questions and all were answered.  The patient agreed with the plan and demonstrated an understanding of the instructions.  The patient was advised to call back if the symptoms worsen or if the condition fails to improve as anticipated.  Greater than 50% was spent in counseling and coordination of care with this patient including but not limited to discussion of the relevant topics above (See A&P) including, but not limited to diagnosis and management of acute and chronic medical conditions.   Faythe Casa, NP 07/04/2020 3:45 PM

## 2020-07-04 NOTE — Progress Notes (Signed)
Patient here for follow up. She has no concerns today.

## 2020-07-04 NOTE — Addendum Note (Signed)
Addended by: Drue Dun on: 07/04/2020 04:26 PM   Modules accepted: Orders

## 2020-07-13 ENCOUNTER — Telehealth: Payer: Self-pay

## 2020-07-13 NOTE — Telephone Encounter (Signed)
Contacted pt to ask if she was interested in IV iron totaling 3 doses given every other day or she also has the option to double her iron intake orally per Sonia Baller. Pt understood both option and will like to try doubling her iron intake and see if it makes a change. Informed pt of future appt in August, she confirmed appt and understood to increase her iron intake.

## 2020-07-13 NOTE — Progress Notes (Signed)
She is currently on oral iron. Can you call her and see if she is interested in IV iron? She has never had it before. She would need probably 3 doses and they are given every other day. She aslo can increased her oral iron if she would like.   Faythe Casa, NP 07/13/2020 12:54 PM

## 2020-08-15 ENCOUNTER — Encounter: Payer: Self-pay | Admitting: Hematology and Oncology

## 2020-09-07 ENCOUNTER — Encounter: Payer: Self-pay | Admitting: Family Medicine

## 2020-09-07 ENCOUNTER — Telehealth: Payer: Self-pay | Admitting: Internal Medicine

## 2020-09-07 NOTE — Telephone Encounter (Signed)
Patient called to request that she has her labs drawn at [facility] prior to next visit instead of having them done in the clinic.   Appt 09/12/20 Labs (per appt notes) CBC with diff, CMP, ferritin, iron studies.   Please advice, Anderson Malta

## 2020-09-07 NOTE — Telephone Encounter (Signed)
[facility] form filled out. Dr. B will have to sign tomorrow he is gone for the afternoon. Will call patient when form is ready.

## 2020-09-08 NOTE — Telephone Encounter (Signed)
LVM letting pt know that [facility] form is filled out. Asked if she wanted it faxed or if she wanted to pick it up. Told her to call back at her convenience to verify.

## 2020-09-08 NOTE — Telephone Encounter (Signed)
Sent mychart message as well

## 2020-09-11 ENCOUNTER — Ambulatory Visit
Admission: RE | Admit: 2020-09-11 | Discharge: 2020-09-11 | Disposition: A | Discharge status: Home / Self Care | Payer: 59 | Source: Ambulatory Visit | Attending: Oncology | Admitting: Oncology

## 2020-09-11 ENCOUNTER — Other Ambulatory Visit: Payer: Self-pay

## 2020-09-11 DIAGNOSIS — Z1231 Encounter for screening mammogram for malignant neoplasm of breast: Secondary | ICD-10-CM | POA: Diagnosis not present

## 2020-09-11 DIAGNOSIS — C8198 Hodgkin lymphoma, unspecified, lymph nodes of multiple sites: Secondary | ICD-10-CM

## 2020-09-12 ENCOUNTER — Encounter: Payer: Self-pay | Admitting: Internal Medicine

## 2020-09-12 ENCOUNTER — Other Ambulatory Visit: Payer: 59

## 2020-09-12 ENCOUNTER — Inpatient Hospital Stay: Payer: 59

## 2020-09-12 ENCOUNTER — Inpatient Hospital Stay: Payer: 59 | Attending: Internal Medicine | Admitting: Internal Medicine

## 2020-09-12 DIAGNOSIS — E890 Postprocedural hypothyroidism: Secondary | ICD-10-CM

## 2020-09-12 DIAGNOSIS — C8198 Hodgkin lymphoma, unspecified, lymph nodes of multiple sites: Secondary | ICD-10-CM | POA: Diagnosis not present

## 2020-09-12 DIAGNOSIS — D509 Iron deficiency anemia, unspecified: Secondary | ICD-10-CM | POA: Diagnosis not present

## 2020-09-12 NOTE — Assessment & Plan Note (Addendum)
#   Recurrent Hodgkin's lymphoma: 84 age 40 year-stage IIA Hodgkn's in 2001]; s/p ABVD chemotherapy and modified mantle radiation; 2014-right neck recurrence;  2 cycles of BEACOPP- field radiation.CT scan- 09/08/2019 revealed no evidence of recurrent disease.  Clinically, she is doing well. She has had no B symptoms. Continue surveillance. STABLE.   # Iron deficiency anemia: Iron saturation 12.  Ferritin is 18.  Hemoglobin 11.9.  Sec to menorrhagia;    NO GI work up/recommend continued p.o. iron at this time.  #Surveillance for secondary malignancies: August 2022 mammogram normal. Patient aware of importance of annual breast screening s/p mantle radiation.  NCCN recommendations are to initiate 8-10 years s/p radiation or at age 27 (whichever comes first). NCCN Hodgkin Lymphoma Guidelines Panel and the ACS recommends breast MRI in addition to mammography for: Women who received XRT to the chest between ages 30-30. Screening mammogram on 09/08/2019 was negative for malignancy. Patient is unable to afford breast MRI every 6 months alternating with mammogram.   She is agreeable to annual screening mammograms. Orders placed for repeat mammogram in July 2022.  Disposition: #  Follow up- 3 months for MD assessment, labs-[CBC in [facility]]- Dr.B

## 2020-09-12 NOTE — Progress Notes (Signed)
[facility] NOTE  Patient Care Team: Juline Patch, MD as PCP - General (Family Medicine) Lequita Asal, MD (Inactive) as Referring Physician (Hematology and Oncology) Rod Can, CNM as Midwife (Obstetrics)  CHIEF COMPLAINTS/PURPOSE OF CONSULTATION:  Hodgkin's lymphoma/iron deficient anemia  #Hodgkin's lymphoma-recurrent-currently on surveillance   #Iron deficient anemia-menorrhagia; no GI work-up Oncology History   No history exists.     HISTORY OF PRESENTING ILLNESS:  Gastonia L Jeziorski 40 y.o.  female with a history of recurrent Hodgkin's lymphoma currently under surveillance and also history of iron deficiency anemia is here for follow-up.  Patient denies any nausea vomiting abdominal pain.  Denies any chest pain.  Admits to heavy menstrual cycles.  Continues to be on oral iron pill.  Review of Systems  Constitutional:  Negative for chills, diaphoresis, fever, malaise/fatigue and weight loss.  HENT:  Negative for nosebleeds and sore throat.   Eyes:  Negative for double vision.  Respiratory:  Negative for cough, hemoptysis, sputum production, shortness of breath and wheezing.   Cardiovascular:  Negative for chest pain, palpitations, orthopnea and leg swelling.  Gastrointestinal:  Negative for abdominal pain, blood in stool, constipation, diarrhea, heartburn, melena, nausea and vomiting.  Genitourinary:  Negative for dysuria, frequency and urgency.  Musculoskeletal:  Negative for back pain and joint pain.  Skin: Negative.  Negative for itching and rash.  Neurological:  Negative for dizziness, tingling, focal weakness, weakness and headaches.  Endo/Heme/Allergies:  Does not bruise/bleed easily.  Psychiatric/Behavioral:  Negative for depression. The patient is not nervous/anxious and does not have insomnia.     MEDICAL HISTORY:  Past Medical History:  Diagnosis Date   Anxiety    Constipation    Cramp of limb    Depression    GERD  (gastroesophageal reflux disease)    Hives    Hodgkin disease ([place]) 2001 and 2014   chemo and rad tx in 2001 and 2014   Hodgkin's disease in remission ([place])    Hyperlipidemia    Insomnia    Lymphoma, Hodgkin's ([place])    2001, 2014   Migraine    Obesity    Oligomenorrhea    Personal history of chemotherapy    Personal history of radiation therapy    Primary female infertility    Screening mammogram for high-risk patient    Seborrhea    Snoring    Thyroid disease    TMJ (dislocation of temporomandibular joint)    Vitamin D deficiency     SURGICAL HISTORY: Past Surgical History:  Procedure Laterality Date   lymphonodi biopsy     PORT-A-CATH REMOVAL  2004   PORTACATH PLACEMENT  11-24-12   THYROID SURGERY     THYROIDECTOMY  10-13-12   uterine poly removal Left 08/2010   [facility]    SOCIAL HISTORY: Social History   Socioeconomic History   Marital status: Married    Spouse name: Not on file   Number of children: Not on file   Years of education: Not on file   Highest education level: Not on file  Occupational History   Occupation: billing     Comment: [place]  Tobacco Use   Smoking status: Former    Packs/day: 1.00    Years: 0.00    Pack years: 0.00    Types: Cigarettes    Quit date: 07/27/2009    Years since quitting: 11.1   Smokeless tobacco: Never  Vaping Use   Vaping Use: Never used  Substance  and Sexual Activity   Alcohol use: Yes    Alcohol/week: 0.0 standard drinks    Comment: rarely   Drug use: No   Sexual activity: Yes    Birth control/protection: None  Other Topics Concern   Not on file  Social History Narrative   Married, has fertility problems   Social Determinants of Radio broadcast assistant Strain: Not on file  Food Insecurity: Not on file  Transportation Needs: Not on file  Physical Activity: Not on file  Stress: Not on file  Social Connections: Not on file  Intimate Partner Violence: Not on file    FAMILY  HISTORY: Family History  Problem Relation Age of Onset   Hypertension Mother    Liver disease Mother    Cancer Mother        breast cancer, cervical cancer   Breast cancer Mother 1   Diabetes Father    Heart disease Father    CAD Father    Sudden Cardiac Death Father    Coronary artery disease Father    Diabetes Brother    Breast cancer Paternal Aunt        >50   Breast cancer Cousin    Asthma Cousin        12s   CAD Maternal Grandmother    Parkinson's disease Maternal Grandmother    Coronary artery disease Maternal Grandmother    Parkinson's disease Maternal Grandfather    Coronary artery disease Maternal Grandfather    CAD Paternal Grandmother    Coronary artery disease Paternal Grandmother    Diabetes Paternal Grandmother    Breast cancer Maternal Aunt        late years   Coronary artery disease Paternal Grandfather    Diabetes Paternal Grandfather     ALLERGIES:  is allergic to tape, barium-containing compounds, monosodium glutamate, and other.  MEDICATIONS:  Current Outpatient Medications  Medication Sig Dispense Refill   Ascorbic Acid (VITAMIN C PO) Take by mouth.     Calcium Citrate 333 MG TABS      ferrous sulfate 325 (65 FE) MG EC tablet Take 1 tablet (325 mg total) by mouth every morning. 30 tablet 0   fexofenadine (ALLEGRA) 180 MG tablet Take by mouth.     hydrocortisone cream 0.5 %      hydrOXYzine (ATARAX/VISTARIL) 25 MG tablet Take 1 tablet (25 mg total) by mouth every 6 (six) hours as needed. 30 tablet 0   Multiple Vitamin (MULTIVITAMIN ADULT PO) Take by mouth.     omeprazole (PRILOSEC OTC) 20 MG tablet Take by mouth.     calcitRIOL (ROCALTROL) 0.5 MCG capsule Take 1 capsule (0.5 mcg total) by mouth 2 (two) times daily. 180 capsule 1   EPINEPHrine 0.3 mg/0.3 mL IJ SOAJ injection  (Patient not taking: Reported on 09/12/2020)     levothyroxine (SYNTHROID) 200 MCG tablet TAKE 1 TABLET(200 MCG) BY MOUTH DAILY BEFORE AND BREAKFAST 30 tablet 1   levothyroxine  (SYNTHROID) 25 MCG tablet Take 1 tablet (25 mcg total) by mouth daily. 30 tablet 1   No current facility-administered medications for this visit.      Marland Kitchen  PHYSICAL EXAMINATION: ECOG PERFORMANCE STATUS: 0 - Asymptomatic  Vitals:   09/12/20 1353  BP: 127/73  Pulse: 80  Resp: 18  Temp: 98 F (36.7 C)  SpO2: 98%   Filed Weights   09/12/20 1353  Weight: 222 lb 12.4 oz (101.1 kg)    Physical Exam Vitals and nursing note reviewed.  Constitutional:  Comments:      HENT:     Head: Normocephalic and atraumatic.     Mouth/Throat:     Pharynx: Oropharynx is clear.  Eyes:     Extraocular Movements: Extraocular movements intact.     Pupils: Pupils are equal, round, and reactive to light.  Cardiovascular:     Rate and Rhythm: Normal rate and regular rhythm.  Pulmonary:     Comments: Decreased breath sounds bilaterally.  Abdominal:     Palpations: Abdomen is soft.  Musculoskeletal:        General: Normal range of motion.     Cervical back: Normal range of motion.  Skin:    General: Skin is warm.  Neurological:     General: No focal deficit present.     Mental Status: She is alert and oriented to person, place, and time.  Psychiatric:        Behavior: Behavior normal.        Judgment: Judgment normal.    LABORATORY DATA:  I have reviewed the data as listed Lab Results  Component Value Date   WBC 8.9 07/04/2020   HGB 11.9 (L) 07/04/2020   HCT 36.8 07/04/2020   MCV 78.0 (L) 07/04/2020   PLT 338 07/04/2020   Recent Labs    12/23/19 0845 07/04/20 1518  NA 141 137  K 5.1 3.5  CL 96 99  CO2 23 30  GLUCOSE 87 94  BUN 19 12  CREATININE 0.74 0.68  CALCIUM 8.4* 7.8*  GFRNONAA 104 >60  GFRAA 120  --   PROT  --  7.5  ALBUMIN 4.3 4.0  AST  --  16  ALT  --  13  ALKPHOS  --  45  BILITOT  --  0.3    RADIOGRAPHIC STUDIES: I have personally reviewed the radiological images as listed and agreed with the findings in the report. MM 3D SCREEN BREAST  BILATERAL  Result Date: 09/12/2020 CLINICAL DATA:  Screening. EXAM: DIGITAL SCREENING BILATERAL MAMMOGRAM WITH TOMOSYNTHESIS AND CAD TECHNIQUE: Bilateral screening digital craniocaudal and mediolateral oblique mammograms were obtained. Bilateral screening digital breast tomosynthesis was performed. The images were evaluated with computer-aided detection. COMPARISON:  Previous exam(s). ACR Breast Density Category b: There are scattered areas of fibroglandular density. FINDINGS: There are no findings suspicious for malignancy. IMPRESSION: No mammographic evidence of malignancy. A result letter of this screening mammogram will be mailed directly to the patient. RECOMMENDATION: Screening mammogram at age 54. (Code:SM-B-40A) BI-RADS CATEGORY  1: Negative. Electronically Signed   By: Ammie Ferrier M.D.   On: 09/12/2020 16:24   ASSESSMENT & PLAN:   Hodgkin lymphoma, unspecified, unspecified site [facility]) # Recurrent Hodgkin's lymphoma: 3 age 77 year-stage IIA Hodgkn's in 2001]; s/p ABVD chemotherapy and modified mantle radiation; 2014-right neck recurrence;  2 cycles of BEACOPP- field radiation.CT scan- 09/08/2019 revealed no evidence of recurrent disease.  Clinically, she is doing well. She has had no B symptoms. Continue surveillance. STABLE.    # Iron deficiency anemia: Iron saturation 12.  Ferritin is 18.  Hemoglobin 11.9.  Sec to menorrhagia;    NO GI work up/recommend continued p.o. iron at this time.  #Surveillance for secondary malignancies: August 2022 mammogram normal. Patient aware of importance of annual breast screening s/p mantle radiation.  NCCN recommendations are to initiate 8-10 years s/p radiation or at age 9 (whichever comes first). NCCN Hodgkin Lymphoma Guidelines Panel and the ACS recommends breast MRI in addition to mammography for: Women who received XRT to the  chest between ages 68-30. Screening mammogram on 09/08/2019 was negative for malignancy. Patient is unable to afford  breast MRI every 6 months alternating with mammogram.   She is agreeable to annual screening mammograms. Orders placed for repeat mammogram in July 2022.   Disposition: #  Follow up- 3 months for MD assessment, labs-[CBC in [facility]]- Dr.B  All questions were answered. The patient knows to call the clinic with any problems, questions or concerns.     Cammie Sickle, MD 10/01/2020 10:02 PM

## 2020-09-13 ENCOUNTER — Encounter: Payer: Self-pay | Admitting: *Deleted

## 2020-09-13 ENCOUNTER — Encounter: Payer: Self-pay | Admitting: Internal Medicine

## 2020-09-13 LAB — THYROID PANEL WITH TSH
Free Thyroxine Index: 2.4 (ref 1.2–4.9)
T3 Uptake Ratio: 26 % (ref 24–39)
T4, Total: 9.1 ug/dL (ref 4.5–12.0)
TSH: 3.67 u[IU]/mL (ref 0.450–4.500)

## 2020-09-15 ENCOUNTER — Ambulatory Visit: Payer: 59 | Admitting: Internal Medicine

## 2020-09-15 ENCOUNTER — Other Ambulatory Visit: Payer: 59

## 2020-09-18 ENCOUNTER — Other Ambulatory Visit: Payer: Self-pay

## 2020-09-18 DIAGNOSIS — C8190 Hodgkin lymphoma, unspecified, unspecified site: Secondary | ICD-10-CM

## 2020-09-18 DIAGNOSIS — E890 Postprocedural hypothyroidism: Secondary | ICD-10-CM

## 2020-09-18 MED ORDER — LEVOTHYROXINE SODIUM 200 MCG PO TABS: ORAL_TABLET | ORAL | 1 refills | Status: DC

## 2020-09-18 MED ORDER — LEVOTHYROXINE SODIUM 25 MCG PO TABS: 25.0000 ug | ORAL_TABLET | Freq: Every day | ORAL | 1 refills | Status: DC

## 2020-09-18 MED ORDER — CALCITRIOL 0.5 MCG PO CAPS: 0.5000 ug | ORAL_CAPSULE | Freq: Two times a day (BID) | ORAL | 1 refills | Status: DC

## 2020-09-18 NOTE — Progress Notes (Signed)
Sent in levo 2 doses and calcitriol

## 2020-10-01 ENCOUNTER — Encounter: Payer: Self-pay | Admitting: Hematology and Oncology

## 2020-10-25 ENCOUNTER — Encounter: Payer: Self-pay | Admitting: Family Medicine

## 2020-10-26 ENCOUNTER — Other Ambulatory Visit: Payer: Self-pay

## 2020-10-26 DIAGNOSIS — E890 Postprocedural hypothyroidism: Secondary | ICD-10-CM

## 2020-10-26 MED ORDER — LEVOTHYROXINE SODIUM 200 MCG PO TABS: ORAL_TABLET | ORAL | 0 refills | Status: DC

## 2020-10-26 MED ORDER — LEVOTHYROXINE SODIUM 25 MCG PO TABS: 25.0000 ug | ORAL_TABLET | Freq: Every day | ORAL | 0 refills | Status: DC

## 2020-12-12 ENCOUNTER — Inpatient Hospital Stay: Payer: 59 | Attending: Internal Medicine | Admitting: Internal Medicine

## 2020-12-18 ENCOUNTER — Other Ambulatory Visit: Payer: Self-pay | Admitting: Family Medicine

## 2020-12-18 DIAGNOSIS — E890 Postprocedural hypothyroidism: Secondary | ICD-10-CM

## 2020-12-18 NOTE — Telephone Encounter (Signed)
Requested medication (s) are due for refill today Yes  Requested medication (s) are on the active medication list Yes  Future visit scheduled No  Note to clinic-Post surgical TSH noted on 09/12/20 3.67. Routing to verify continued dosage desired-for physician review. Thank you.   Requested Prescriptions  Pending Prescriptions Disp Refills   levothyroxine (SYNTHROID) 25 MCG tablet [Pharmacy Med Name: LEVOTHYROXINE SODIUM 25 MCG TAB] 30 tablet 0    Sig: TAKE 1 TABLET BY MOUTH DAILY.     Endocrinology:  Hypothyroid Agents Failed - 12/18/2020 11:07 AM      Failed - TSH needs to be rechecked within 3 months after an abnormal result. Refill until TSH is due.      Passed - TSH in normal range and within 360 days    TSH  Date Value Ref Range Status  09/12/2020 3.670 0.450 - 4.500 uIU/mL Final          Passed - Valid encounter within last 12 months    Recent Outpatient Visits           5 months ago Post-surgical hypothyroidism   [facility] Juline Patch, MD   12 months ago Post-surgical hypothyroidism   [facility] Juline Patch, MD   1 year ago Major depression, recurrent, chronic [facility])   [facility] Juline Patch, MD   1 year ago Establishing care with new doctor, encounter for   [place], MD   4 years ago Acquired hypothyroidism   [facility] [place], Drue Stager, MD               levothyroxine (SYNTHROID) 200 MCG tablet [Pharmacy Med Name: LEVOTHYROXINE SODIUM 200 MCG TAB] 30 tablet 0    Sig: TAKE 1 TABLET BY MOUTH DAILY BEFORE BREAKFAST.     Endocrinology:  Hypothyroid Agents Failed - 12/18/2020 11:07 AM      Failed - TSH needs to be rechecked within 3 months after an abnormal result. Refill until TSH is due.      Passed - TSH in normal range and within 360 days    TSH  Date Value Ref Range Status  09/12/2020 3.670 0.450 - 4.500 uIU/mL Final          Passed - Valid encounter within  last 12 months    Recent Outpatient Visits           5 months ago Post-surgical hypothyroidism   [facility] Juline Patch, MD   12 months ago Post-surgical hypothyroidism   [facility] Juline Patch, MD   1 year ago Major depression, recurrent, chronic [facility])   [facility] Juline Patch, MD   1 year ago Establishing care with new doctor, encounter for   [place], MD   4 years ago Acquired hypothyroidism   [facility] Steele Sizer, MD

## 2020-12-19 ENCOUNTER — Ambulatory Visit: Payer: 59 | Admitting: Internal Medicine

## 2021-01-23 ENCOUNTER — Ambulatory Visit (INDEPENDENT_AMBULATORY_CARE_PROVIDER_SITE_OTHER): Payer: 59 | Admitting: Family Medicine

## 2021-01-23 ENCOUNTER — Encounter: Payer: Self-pay | Admitting: Family Medicine

## 2021-01-23 ENCOUNTER — Other Ambulatory Visit: Payer: Self-pay

## 2021-01-23 VITALS — BP 112/66 | HR 113 | Temp 98.6°F | Ht 66.0 in | Wt 211.0 lb

## 2021-01-23 DIAGNOSIS — R051 Acute cough: Secondary | ICD-10-CM | POA: Diagnosis not present

## 2021-01-23 DIAGNOSIS — R110 Nausea: Secondary | ICD-10-CM

## 2021-01-23 DIAGNOSIS — E860 Dehydration: Secondary | ICD-10-CM | POA: Diagnosis not present

## 2021-01-23 DIAGNOSIS — J019 Acute sinusitis, unspecified: Secondary | ICD-10-CM

## 2021-01-23 MED ORDER — ONDANSETRON HCL 4 MG PO TABS: 4.0000 mg | ORAL_TABLET | Freq: Three times a day (TID) | ORAL | 0 refills | Status: DC | PRN

## 2021-01-23 MED ORDER — AMOXICILLIN 500 MG PO CAPS: 500.0000 mg | ORAL_CAPSULE | Freq: Three times a day (TID) | ORAL | 0 refills | Status: AC

## 2021-01-23 MED ORDER — GUAIFENESIN-CODEINE 100-10 MG/5ML PO SYRP: 5.0000 mL | ORAL_SOLUTION | Freq: Three times a day (TID) | ORAL | 0 refills | Status: DC | PRN

## 2021-01-23 NOTE — Progress Notes (Signed)
Date:  01/23/2021   Name:  Jo [name]   DOB:  1980-12-31   MRN:  [id number]   Chief Complaint: Cough (Cong, nausea, hoarse, chunky production- bloody/ green )  Cough This is a new problem. The current episode started in the past 7 days. The problem has been gradually worsening. The cough is Productive of purulent sputum. Associated symptoms include chills, headaches, nasal congestion, postnasal drip, a sore throat and sweats. Pertinent negatives include no ear congestion, ear pain, fever, shortness of breath or wheezing. The symptoms are aggravated by lying down. She has tried nothing for the symptoms. The treatment provided mild relief.  Sinusitis This is a new problem. The current episode started in the past 7 days. The problem has been gradually worsening since onset. There has been no fever. She is experiencing no pain. Associated symptoms include chills, congestion, coughing, diaphoresis, headaches, a hoarse voice and a sore throat. Pertinent negatives include no ear pain, neck pain, shortness of breath, sinus pressure, sneezing or swollen glands. Treatments tried: ibuprofen. The treatment provided mild relief.   Lab Results  Component Value Date   NA 137 07/04/2020   K 3.5 07/04/2020   CO2 30 07/04/2020   GLUCOSE 94 07/04/2020   BUN 12 07/04/2020   CREATININE 0.68 07/04/2020   CALCIUM 7.8 (L) 07/04/2020   GFRNONAA >60 07/04/2020   Lab Results  Component Value Date   CHOL 170 04/19/2016   HDL 51 04/19/2016   LDLCALC 96 04/19/2016   TRIG 113 04/19/2016   CHOLHDL 3.3 04/19/2016   Lab Results  Component Value Date   TSH 3.670 09/12/2020   Lab Results  Component Value Date   HGBA1C 5.4 04/19/2016   Lab Results  Component Value Date   WBC 8.9 07/04/2020   HGB 11.9 (L) 07/04/2020   HCT 36.8 07/04/2020   MCV 78.0 (L) 07/04/2020   PLT 338 07/04/2020   Lab Results  Component Value Date   ALT 13 07/04/2020   AST 16 07/04/2020   ALKPHOS 45 07/04/2020   BILITOT  0.3 07/04/2020   Lab Results  Component Value Date   VD25OH 48.4 07/26/2016     Review of Systems  Constitutional:  Positive for chills and diaphoresis. Negative for fever.  HENT:  Positive for congestion, hoarse voice, postnasal drip and sore throat. Negative for ear pain, sinus pressure and sneezing.   Respiratory:  Positive for cough. Negative for shortness of breath and wheezing.   Gastrointestinal:  Positive for nausea.  Musculoskeletal:  Negative for neck pain.  Neurological:  Positive for headaches.   Patient Active Problem List   Diagnosis Date Noted   Anxiety 08/31/2019   Multinodular goiter 08/31/2019   Iron deficiency anemia 08/31/2019   Prediabetes 12/06/2016   Recurrent major depressive disorder, in partial remission ([place]) 12/05/2016   Tension type headache 04/27/2015   Hypocalcemia 04/17/2015   Hypoparathyroidism ([place]) 04/17/2015   GAD (generalized anxiety disorder) 10/18/2014   RLS (restless legs syndrome) 10/18/2014   Insomnia, persistent 10/18/2014   Migraine without aura and responsive to treatment 10/18/2014   CN (constipation) 10/18/2014   Major depression, recurrent, chronic ([place]) 10/18/2014   Dysmenorrhea 10/18/2014   Female infertility 10/18/2014   Gastro-esophageal reflux disease without esophagitis 10/18/2014   Allergy, food 10/18/2014   Hodgkin lymphoma, unspecified, unspecified site ([place]) 10/18/2014   H/O thyroidectomy 21/30/8657   Metabolic syndrome 84/69/6295   Morbid obesity, unspecified obesity type ([place]) 10/18/2014   Low parathyroid level after removal 10/18/2014  Vitamin D deficiency 10/18/2014   Hive 10/18/2014   Dermatitis seborrheica 10/18/2014   Arthralgia of temporomandibular joint 10/18/2014   Post-surgical hypothyroidism 10/13/2012    Allergies  Allergen Reactions   Tape Rash    Uncoded Allergy. Allergen: Msg   Barium-Containing Compounds Hives    Oral contrast   Monosodium Glutamate     Unknown.   Other Rash and Other  (See Comments)    Contrast dye Uncoded Allergy. Allergen: Msg Contrast dye  Pt states she has no Iodine or Omni IV contrast reaction. She is only allergic to Barium Oral contrast. ASW 09/08/19     Past Surgical History:  Procedure Laterality Date   lymphonodi biopsy     PORT-A-CATH REMOVAL  2004   PORTACATH PLACEMENT  11-24-12   THYROID SURGERY     THYROIDECTOMY  10-13-12   uterine poly removal Left 08/2010   [facility]    Social History   Tobacco Use   Smoking status: Former    Packs/day: 1.00    Years: 0.00    Pack years: 0.00    Types: Cigarettes    Quit date: 07/27/2009    Years since quitting: 11.5   Smokeless tobacco: Never  Vaping Use   Vaping Use: Never used  Substance Use Topics   Alcohol use: Yes    Alcohol/week: 0.0 standard drinks    Comment: rarely   Drug use: No     Medication list has been reviewed and updated.  Current Meds  Medication Sig   Ascorbic Acid (VITAMIN C PO) Take by mouth.   calcitRIOL (ROCALTROL) 0.5 MCG capsule Take 1 capsule (0.5 mcg total) by mouth 2 (two) times daily.   Calcium Citrate 333 MG TABS    EPINEPHrine 0.3 mg/0.3 mL IJ SOAJ injection    ferrous sulfate 325 (65 FE) MG EC tablet Take 1 tablet (325 mg total) by mouth every morning.   fexofenadine (ALLEGRA) 180 MG tablet Take by mouth.   hydrocortisone cream 0.5 %    levothyroxine (SYNTHROID) 200 MCG tablet TAKE 1 TABLET BY MOUTH DAILY BEFORE BREAKFAST.   levothyroxine (SYNTHROID) 25 MCG tablet TAKE 1 TABLET BY MOUTH DAILY.   Multiple Vitamin (MULTIVITAMIN ADULT PO) Take by mouth.   omeprazole (PRILOSEC OTC) 20 MG tablet Take by mouth.    PHQ 2/9 Scores 01/23/2021 06/29/2020 12/23/2019 09/09/2019  PHQ - 2 Score 0 0 0 2  PHQ- 9 Score 0 0 0 4  Exception Documentation - - - -  Not completed - - - -    GAD 7 : Generalized Anxiety Score 06/29/2020 12/23/2019 09/09/2019 08/12/2019  Nervous, Anxious, on Edge 0 0 1 1  Control/stop worrying 0 0 0 2  Worry too much - different things 0 0  0 2  Trouble relaxing 0 0 0 1  Restless 0 0 0 0  Easily annoyed or irritable 0 0 1 1  Afraid - awful might happen 0 0 1 0  Total GAD 7 Score 0 0 3 7  Anxiety Difficulty - - Not difficult at all Not difficult at all    BP Readings from Last 3 Encounters:  01/23/21 112/66  09/12/20 127/73  07/04/20 (!) 145/83    Physical Exam Vitals and nursing note reviewed.  Constitutional:      General: She is not in acute distress.    Appearance: She is not diaphoretic.  HENT:     Head: Normocephalic and atraumatic.     Right Ear: Tympanic membrane and external ear  normal.     Left Ear: Tympanic membrane and external ear normal.     Nose: Nose normal.     Mouth/Throat:     Mouth: Mucous membranes are dry.  Eyes:     General:        Right eye: No discharge.        Left eye: No discharge.     Conjunctiva/sclera: Conjunctivae normal.     Pupils: Pupils are equal, round, and reactive to light.  Neck:     Thyroid: No thyromegaly.     Vascular: No JVD.  Cardiovascular:     Rate and Rhythm: Normal rate and regular rhythm.     Heart sounds: Normal heart sounds. No murmur heard.   No friction rub. No gallop.  Pulmonary:     Effort: Pulmonary effort is normal.     Breath sounds: Normal breath sounds. No wheezing or rhonchi.  Abdominal:     General: Bowel sounds are normal.     Palpations: Abdomen is soft. There is no mass.     Tenderness: There is no abdominal tenderness. There is no guarding.  Musculoskeletal:        General: Normal range of motion.     Cervical back: Normal range of motion and neck supple.  Lymphadenopathy:     Cervical: No cervical adenopathy.  Skin:    General: Skin is warm and dry.  Neurological:     Mental Status: She is alert.     Deep Tendon Reflexes: Reflexes are normal and symmetric.    Wt Readings from Last 3 Encounters:  01/23/21 211 lb (95.7 kg)  09/12/20 222 lb 12.4 oz (101.1 kg)  07/04/20 216 lb 13.2 oz (98.4 kg)    BP 112/66   Pulse (!) 113    Temp 98.6 F (37 C) (Oral)   Ht 5\' 6"  (1.676 m)   Wt 211 lb (95.7 kg)   LMP 01/17/2021 (Approximate)   SpO2 96%   BMI 34.06 kg/m   Assessment and Plan:  1. Acute sinusitis, recurrence not specified, unspecified location New onset.  Persistent.  Relatively stable.  Exam and history consistent with early sinus infection.  We will treat with amoxicillin 500 mg 3 times a day for 10 days. - amoxicillin (AMOXIL) 500 MG capsule; Take 1 capsule (500 mg total) by mouth 3 (three) times daily for 10 days.  Dispense: 30 capsule; Refill: 0  2. Acute cough New onset.  Persistent.  Episodic.  Particularly when lying supine.  We will treat with Robitussin-AC teaspoon every 6 as needed cough. - guaiFENesin-codeine (ROBITUSSIN AC) 100-10 MG/5ML syrup; Take 5 mLs by mouth 3 (three) times daily as needed for cough.  Dispense: 118 mL; Refill: 0  3. Nausea New onset.  Persistent.  Patient unable to eat secondary to nausea.  We will treat symptoms with Zofran 4 mg every 8 hours as needed for nausea. - ondansetron (ZOFRAN) 4 MG tablet; Take 1 tablet (4 mg total) by mouth every 8 (eight) hours as needed for nausea or vomiting.  Dispense: 20 tablet; Refill: 0  4. Dehydration Patient encouraged to push fluids over the next 24 to 48 hours mucous membranes have decreased moisture.

## 2021-03-01 ENCOUNTER — Encounter: Payer: Self-pay | Admitting: Family Medicine

## 2021-03-01 ENCOUNTER — Encounter: Payer: Self-pay | Admitting: Hematology and Oncology

## 2021-03-02 ENCOUNTER — Encounter: Payer: Self-pay | Admitting: Family Medicine

## 2021-03-02 ENCOUNTER — Other Ambulatory Visit: Payer: Self-pay

## 2021-03-02 ENCOUNTER — Ambulatory Visit: Payer: Managed Care, Other (non HMO) | Admitting: Family Medicine

## 2021-03-02 VITALS — BP 106/72 | HR 80 | Ht 66.0 in | Wt 214.0 lb

## 2021-03-02 DIAGNOSIS — Z6834 Body mass index (BMI) 34.0-34.9, adult: Secondary | ICD-10-CM

## 2021-03-02 DIAGNOSIS — E890 Postprocedural hypothyroidism: Secondary | ICD-10-CM

## 2021-03-02 DIAGNOSIS — L500 Allergic urticaria: Secondary | ICD-10-CM | POA: Diagnosis not present

## 2021-03-02 MED ORDER — LEVOTHYROXINE SODIUM 200 MCG PO TABS: ORAL_TABLET | ORAL | 1 refills | Status: DC

## 2021-03-02 MED ORDER — LEVOTHYROXINE SODIUM 25 MCG PO TABS: 25.0000 ug | ORAL_TABLET | Freq: Every day | ORAL | 1 refills | Status: DC

## 2021-03-02 NOTE — Progress Notes (Signed)
Date:  03/02/2021   Name:  Jo [name]   DOB:  Jul 11, 1980   MRN:  [id number]   Chief Complaint: Hypothyroidism  Thyroid Problem Presents for follow-up visit. Patient reports no anxiety, cold intolerance, constipation, depressed mood, diaphoresis, diarrhea, dry skin, fatigue, hair loss, heat intolerance, hoarse voice, leg swelling, menstrual problem, nail problem, palpitations, tremors, visual change, weight gain or weight loss. The symptoms have been stable.   Lab Results  Component Value Date   NA 137 07/04/2020   K 3.5 07/04/2020   CO2 30 07/04/2020   GLUCOSE 94 07/04/2020   BUN 12 07/04/2020   CREATININE 0.68 07/04/2020   CALCIUM 7.8 (L) 07/04/2020   GFRNONAA >60 07/04/2020   Lab Results  Component Value Date   CHOL 170 04/19/2016   HDL 51 04/19/2016   LDLCALC 96 04/19/2016   TRIG 113 04/19/2016   CHOLHDL 3.3 04/19/2016   Lab Results  Component Value Date   TSH 3.670 09/12/2020   Lab Results  Component Value Date   HGBA1C 5.4 04/19/2016   Lab Results  Component Value Date   WBC 8.9 07/04/2020   HGB 11.9 (L) 07/04/2020   HCT 36.8 07/04/2020   MCV 78.0 (L) 07/04/2020   PLT 338 07/04/2020   Lab Results  Component Value Date   ALT 13 07/04/2020   AST 16 07/04/2020   ALKPHOS 45 07/04/2020   BILITOT 0.3 07/04/2020   Lab Results  Component Value Date   VD25OH 48.4 07/26/2016     Review of Systems  Constitutional:  Negative for chills, diaphoresis, fatigue, fever, weight gain and weight loss.  HENT:  Negative for drooling, ear discharge, ear pain, hoarse voice and sore throat.   Respiratory:  Negative for cough, shortness of breath and wheezing.   Cardiovascular:  Negative for chest pain, palpitations and leg swelling.  Gastrointestinal:  Negative for abdominal pain, blood in stool, constipation, diarrhea and nausea.  Endocrine: Negative for cold intolerance, heat intolerance and polydipsia.  Genitourinary:  Negative for dysuria, frequency, hematuria,  menstrual problem and urgency.  Musculoskeletal:  Negative for back pain, myalgias and neck pain.  Skin:  Negative for rash.  Allergic/Immunologic: Negative for environmental allergies.  Neurological:  Negative for dizziness, tremors and headaches.  Hematological:  Does not bruise/bleed easily.  Psychiatric/Behavioral:  Negative for suicidal ideas. The patient is not nervous/anxious.    Patient Active Problem List   Diagnosis Date Noted   Anxiety 08/31/2019   Multinodular goiter 08/31/2019   Iron deficiency anemia 08/31/2019   Prediabetes 12/06/2016   Recurrent major depressive disorder, in partial remission ([place]) 12/05/2016   Tension type headache 04/27/2015   Hypocalcemia 04/17/2015   Hypoparathyroidism ([place]) 04/17/2015   GAD (generalized anxiety disorder) 10/18/2014   RLS (restless legs syndrome) 10/18/2014   Insomnia, persistent 10/18/2014   Migraine without aura and responsive to treatment 10/18/2014   CN (constipation) 10/18/2014   Major depression, recurrent, chronic ([place]) 10/18/2014   Dysmenorrhea 10/18/2014   Female infertility 10/18/2014   Gastro-esophageal reflux disease without esophagitis 10/18/2014   Allergy, food 10/18/2014   Hodgkin lymphoma, unspecified, unspecified site ([place]) 10/18/2014   H/O thyroidectomy 34/19/6222   Metabolic syndrome 97/98/9211   Morbid obesity, unspecified obesity type ([place]) 10/18/2014   Low parathyroid level after removal 10/18/2014   Vitamin D deficiency 10/18/2014   Hive 10/18/2014   Dermatitis seborrheica 10/18/2014   Arthralgia of temporomandibular joint 10/18/2014   Post-surgical hypothyroidism 10/13/2012    Allergies  Allergen Reactions   Tape  Rash    Uncoded Allergy. Allergen: Msg   Barium-Containing Compounds Hives    Oral contrast   Monosodium Glutamate     Unknown.   Other Rash and Other (See Comments)    Contrast dye Uncoded Allergy. Allergen: Msg Contrast dye  Pt states she has no Iodine or Omni IV contrast  reaction. She is only allergic to Barium Oral contrast. ASW 09/08/19     Past Surgical History:  Procedure Laterality Date   lymphonodi biopsy     PORT-A-CATH REMOVAL  2004   PORTACATH PLACEMENT  11-24-12   THYROID SURGERY     THYROIDECTOMY  10-13-12   uterine poly removal Left 08/2010   [facility]    Social History   Tobacco Use   Smoking status: Former    Packs/day: 1.00    Years: 0.00    Pack years: 0.00    Types: Cigarettes    Quit date: 07/27/2009    Years since quitting: 11.6   Smokeless tobacco: Never  Vaping Use   Vaping Use: Never used  Substance Use Topics   Alcohol use: Yes    Alcohol/week: 0.0 standard drinks    Comment: rarely   Drug use: No     Medication list has been reviewed and updated.  Current Meds  Medication Sig   Ascorbic Acid (VITAMIN C PO) Take by mouth.   calcitRIOL (ROCALTROL) 0.5 MCG capsule Take 1 capsule (0.5 mcg total) by mouth 2 (two) times daily.   Calcium Citrate 333 MG TABS    EPINEPHrine 0.3 mg/0.3 mL IJ SOAJ injection    ferrous sulfate 325 (65 FE) MG EC tablet Take 1 tablet (325 mg total) by mouth every morning.   fexofenadine (ALLEGRA) 180 MG tablet Take by mouth.   hydrocortisone cream 0.5 %    levothyroxine (SYNTHROID) 200 MCG tablet TAKE 1 TABLET BY MOUTH DAILY BEFORE BREAKFAST.   levothyroxine (SYNTHROID) 25 MCG tablet TAKE 1 TABLET BY MOUTH DAILY.   Multiple Vitamin (MULTIVITAMIN ADULT PO) Take by mouth.   omeprazole (PRILOSEC OTC) 20 MG tablet Take by mouth.    PHQ 2/9 Scores 03/02/2021 01/23/2021 06/29/2020 12/23/2019  PHQ - 2 Score 0 0 0 0  PHQ- 9 Score 0 0 0 0  Exception Documentation - - - -  Not completed - - - -    GAD 7 : Generalized Anxiety Score 03/02/2021 06/29/2020 12/23/2019 09/09/2019  Nervous, Anxious, on Edge 0 0 0 1  Control/stop worrying 0 0 0 0  Worry too much - different things 0 0 0 0  Trouble relaxing 0 0 0 0  Restless 0 0 0 0  Easily annoyed or irritable 0 0 0 1  Afraid - awful might happen 0 0 0 1   Total GAD 7 Score 0 0 0 3  Anxiety Difficulty Not difficult at all - - Not difficult at all    BP Readings from Last 3 Encounters:  03/02/21 106/72  01/23/21 112/66  09/12/20 127/73    Physical Exam Vitals and nursing note reviewed. Exam conducted with a chaperone present.  Constitutional:      General: She is not in acute distress.    Appearance: She is not diaphoretic.  HENT:     Head: Normocephalic and atraumatic.     Right Ear: Tympanic membrane, ear canal and external ear normal. There is no impacted cerumen.     Left Ear: Tympanic membrane, ear canal and external ear normal. There is no impacted cerumen.  Nose: Nose normal. No congestion or rhinorrhea.  Eyes:     General:        Right eye: No discharge.        Left eye: No discharge.     Conjunctiva/sclera: Conjunctivae normal.     Pupils: Pupils are equal, round, and reactive to light.  Neck:     Thyroid: No thyromegaly.     Vascular: No JVD.  Cardiovascular:     Rate and Rhythm: Normal rate and regular rhythm.     Heart sounds: Normal heart sounds. No murmur heard.   No friction rub. No gallop.  Pulmonary:     Effort: Pulmonary effort is normal.     Breath sounds: Normal breath sounds. No wheezing, rhonchi or rales.  Chest:     Chest wall: No tenderness.  Abdominal:     General: Bowel sounds are normal.     Palpations: Abdomen is soft. There is no mass.     Tenderness: There is no abdominal tenderness. There is no guarding.  Musculoskeletal:        General: Normal range of motion.     Cervical back: Normal range of motion and neck supple.  Lymphadenopathy:     Cervical: No cervical adenopathy.  Skin:    General: Skin is warm and dry.  Neurological:     Mental Status: She is alert.     Deep Tendon Reflexes: Reflexes are normal and symmetric.    Wt Readings from Last 3 Encounters:  03/02/21 214 lb (97.1 kg)  01/23/21 211 lb (95.7 kg)  09/12/20 222 lb 12.4 oz (101.1 kg)    BP 106/72    Pulse 80     Ht 5\' 6"  (1.676 m)    Wt 214 lb (97.1 kg)    LMP 02/11/2021 (Approximate)    BMI 34.54 kg/m   Assessment and Plan:  1. Post-surgical hypothyroidism Chronic.  Controlled.  Stable.  Continue current dosing of levothyroxine at 225 mcg once a day.  Will check TSH for current level of control. - levothyroxine (SYNTHROID) 200 MCG tablet; TAKE 1 TABLET BY MOUTH DAILY BEFORE BREAKFAST.  Dispense: 90 tablet; Refill: 1 - levothyroxine (SYNTHROID) 25 MCG tablet; Take 1 tablet (25 mcg total) by mouth daily.  Dispense: 90 tablet; Refill: 1 - TSH  2. Urticaria due to food allergy Patient has noted urticaria upon consumption of beef.  This is concerning for beef reactivity and we will check an alpha gal panel.  I have discussed with patient that if she is indeed having some urticaria with beef consumption that the alpha gal is not all-inclusive and that she should have an EpiPen and this has been provided for her. - Alpha-Gal Panel  3. BMI 34.0-34.9,adult Health risks of being over weight were discussed and patient was counseled on weight loss options and exercise.  We will check her lipid panel to see if this is becoming an issue and if therapeutic will be necessary.  In the meantime patient has been given low-cholesterol low triglyceride guidelines. - Lipid Panel With LDL/HDL Ratio

## 2021-03-05 LAB — ALPHA-GAL PANEL
Allergen Lamb IgE: 1.17 kU/L — AB
Beef IgE: 1.2 kU/L — AB
IgE (Immunoglobulin E), Serum: 21 IU/mL (ref 6–495)
O215-IgE Alpha-Gal: 1.34 kU/L — AB
Pork IgE: 0.86 kU/L — AB

## 2021-03-05 LAB — TSH: TSH: 2.56 u[IU]/mL (ref 0.450–4.500)

## 2021-03-05 LAB — LIPID PANEL WITH LDL/HDL RATIO
Cholesterol, Total: 198 mg/dL (ref 100–199)
HDL: 71 mg/dL (ref >39)
LDL Chol Calc (NIH): 104 mg/dL — ABNORMAL HIGH (ref 0–99)
LDL/HDL Ratio: 1.5 ratio (ref 0.0–3.2)
Triglycerides: 132 mg/dL (ref 0–149)
VLDL Cholesterol Cal: 23 mg/dL (ref 5–40)

## 2021-11-01 ENCOUNTER — Encounter: Payer: Self-pay | Admitting: Family Medicine

## 2021-11-02 ENCOUNTER — Ambulatory Visit (INDEPENDENT_AMBULATORY_CARE_PROVIDER_SITE_OTHER): Payer: Managed Care, Other (non HMO) | Admitting: Family Medicine

## 2021-11-02 ENCOUNTER — Encounter: Payer: Self-pay | Admitting: Family Medicine

## 2021-11-02 VITALS — BP 120/70 | HR 80 | Ht 66.0 in | Wt 235.0 lb

## 2021-11-02 DIAGNOSIS — D509 Iron deficiency anemia, unspecified: Secondary | ICD-10-CM

## 2021-11-02 DIAGNOSIS — E890 Postprocedural hypothyroidism: Secondary | ICD-10-CM | POA: Diagnosis not present

## 2021-11-02 MED ORDER — LEVOTHYROXINE SODIUM 200 MCG PO TABS: ORAL_TABLET | ORAL | 1 refills | Status: DC

## 2021-11-02 MED ORDER — LEVOTHYROXINE SODIUM 25 MCG PO TABS: 25.0000 ug | ORAL_TABLET | Freq: Every day | ORAL | 1 refills | Status: DC

## 2021-11-02 NOTE — Progress Notes (Signed)
Date:  11/02/2021   Name:  Jo [name]   DOB:  [birth year]   MRN:  [id number]   Chief Complaint: Anemia and Hypothyroidism  Anemia Presents for follow-up visit. There has been no abdominal pain, bruising/bleeding easily, light-headedness, malaise/fatigue, palpitations or weight loss. Signs of blood loss that are not present include hematemesis, hematochezia, melena, menorrhagia and vaginal bleeding. There are no compliance problems.   Thyroid Problem Presents for follow-up visit. Symptoms include dry skin and heat intolerance. Patient reports no anxiety, cold intolerance, constipation, depressed mood, diaphoresis, diarrhea, fatigue, hair loss, menstrual problem, nail problem, palpitations, tremors, weight gain or weight loss.    Lab Results  Component Value Date   NA 137 07/04/2020   K 3.5 07/04/2020   CO2 30 07/04/2020   GLUCOSE 94 07/04/2020   BUN 12 07/04/2020   CREATININE 0.68 07/04/2020   CALCIUM 7.8 (L) 07/04/2020   GFRNONAA >60 07/04/2020   Lab Results  Component Value Date   CHOL 198 03/02/2021   HDL 71 03/02/2021   LDLCALC 104 (H) 03/02/2021   TRIG 132 03/02/2021   CHOLHDL 3.3 04/19/2016   Lab Results  Component Value Date   TSH 2.560 03/02/2021   Lab Results  Component Value Date   HGBA1C 5.4 04/19/2016   Lab Results  Component Value Date   WBC 8.9 07/04/2020   HGB 11.9 (L) 07/04/2020   HCT 36.8 07/04/2020   MCV 78.0 (L) 07/04/2020   PLT 338 07/04/2020   Lab Results  Component Value Date   ALT 13 07/04/2020   AST 16 07/04/2020   ALKPHOS 45 07/04/2020   BILITOT 0.3 07/04/2020   Lab Results  Component Value Date   VD25OH 48.4 07/26/2016     Review of Systems  Constitutional:  Negative for diaphoresis, fatigue, malaise/fatigue, unexpected weight change, weight gain and weight loss.  HENT:  Negative for trouble swallowing.   Eyes:  Negative for visual disturbance.  Respiratory:  Negative for chest tightness, shortness of breath and wheezing.    Cardiovascular:  Negative for chest pain, palpitations and leg swelling.  Gastrointestinal:  Negative for abdominal pain, constipation, diarrhea, hematemesis, hematochezia and melena.  Endocrine: Positive for heat intolerance. Negative for cold intolerance, polydipsia and polyuria.  Genitourinary:  Negative for menorrhagia, menstrual problem and vaginal bleeding.  Neurological:  Negative for tremors and light-headedness.  Hematological:  Does not bruise/bleed easily.  Psychiatric/Behavioral:  The patient is not nervous/anxious.     Patient Active Problem List   Diagnosis Date Noted   Anxiety 08/31/2019   Multinodular goiter 08/31/2019   Iron deficiency anemia 08/31/2019   Prediabetes 12/06/2016   Recurrent major depressive disorder, in partial remission ([place]) 12/05/2016   Tension type headache 04/27/2015   Hypocalcemia 04/17/2015   Hypoparathyroidism ([place]) 04/17/2015   GAD (generalized anxiety disorder) 10/18/2014   RLS (restless legs syndrome) 10/18/2014   Insomnia, persistent 10/18/2014   Migraine without aura and responsive to treatment 10/18/2014   CN (constipation) 10/18/2014   Major depression, recurrent, chronic ([place]) 10/18/2014   Dysmenorrhea 10/18/2014   Female infertility 10/18/2014   Gastro-esophageal reflux disease without esophagitis 10/18/2014   Allergy, food 10/18/2014   Hodgkin lymphoma, unspecified, unspecified site ([place]) 10/18/2014   H/O thyroidectomy 60/63/0160   Metabolic syndrome 10/93/2355   Morbid obesity, unspecified obesity type ([place]) 10/18/2014   Low parathyroid level after removal 10/18/2014   Vitamin D deficiency 10/18/2014   Hive 10/18/2014   Dermatitis seborrheica 10/18/2014   Arthralgia of temporomandibular joint 10/18/2014  Post-surgical hypothyroidism 10/13/2012    Allergies  Allergen Reactions   Tape Rash    Uncoded Allergy. Allergen: Msg   Barium-Containing Compounds Hives    Oral contrast   Monosodium Glutamate     Unknown.    Other Rash and Other (See Comments)    Contrast dye Uncoded Allergy. Allergen: Msg Contrast dye  Pt states she has no Iodine or Omni IV contrast reaction. She is only allergic to Barium Oral contrast. ASW 09/08/19     Past Surgical History:  Procedure Laterality Date   lymphonodi biopsy     PORT-A-CATH REMOVAL  2004   PORTACATH PLACEMENT  11-24-12   THYROID SURGERY     THYROIDECTOMY  10-13-12   uterine poly removal Left 08/2010   [facility]    Social History   Tobacco Use   Smoking status: Former    Packs/day: 1.00    Years: 0.00    Total pack years: 0.00    Types: Cigarettes    Quit date: 07/27/2009    Years since quitting: 12.2   Smokeless tobacco: Never  Vaping Use   Vaping Use: Never used  Substance Use Topics   Alcohol use: Yes    Alcohol/week: 0.0 standard drinks of alcohol    Comment: rarely   Drug use: No     Medication list has been reviewed and updated.  Current Meds  Medication Sig   Ascorbic Acid (VITAMIN C PO) Take by mouth.   calcitRIOL (ROCALTROL) 0.5 MCG capsule Take 1 capsule (0.5 mcg total) by mouth 2 (two) times daily.   Calcium Citrate 333 MG TABS    EPINEPHrine 0.3 mg/0.3 mL IJ SOAJ injection    fexofenadine (ALLEGRA) 180 MG tablet Take by mouth.   hydrocortisone cream 0.5 %    levothyroxine (SYNTHROID) 200 MCG tablet TAKE 1 TABLET BY MOUTH DAILY BEFORE BREAKFAST.   levothyroxine (SYNTHROID) 25 MCG tablet Take 1 tablet (25 mcg total) by mouth daily.   Multiple Vitamin (MULTIVITAMIN ADULT PO) Take by mouth.   omeprazole (PRILOSEC OTC) 20 MG tablet Take by mouth.   [DISCONTINUED] ferrous sulfate 325 (65 FE) MG EC tablet Take 1 tablet (325 mg total) by mouth every morning.       11/02/2021    3:22 PM 03/02/2021    9:34 AM 06/29/2020    4:29 PM 12/23/2019    8:08 AM  GAD 7 : Generalized Anxiety Score  Nervous, Anxious, on Edge 0 0 0 0  Control/stop worrying 0 0 0 0  Worry too much - different things 0 0 0 0  Trouble relaxing 0 0 0 0  Restless 0 0  0 0  Easily annoyed or irritable 0 0 0 0  Afraid - awful might happen 0 0 0 0  Total GAD 7 Score 0 0 0 0  Anxiety Difficulty Not difficult at all Not difficult at all         11/02/2021    3:22 PM 03/02/2021    9:34 AM 01/23/2021    8:48 AM  Depression screen PHQ 2/9  Decreased Interest 0 0 0  Down, Depressed, Hopeless 0 0 0  PHQ - 2 Score 0 0 0  Altered sleeping 0 0 0  Tired, decreased energy 0 0 0  Change in appetite 0 0 0  Feeling bad or failure about yourself  0 0 0  Trouble concentrating 0 0 0  Moving slowly or fidgety/restless 0 0 0  Suicidal thoughts 0 0 0  PHQ-9 Score  0 0 0  Difficult doing work/chores Not difficult at all Not difficult at all     BP Readings from Last 3 Encounters:  11/02/21 120/70  03/02/21 106/72  01/23/21 112/66    Physical Exam Vitals and nursing note reviewed. Exam conducted with a chaperone present.  Constitutional:      General: She is not in acute distress.    Appearance: She is not diaphoretic.  HENT:     Head: Normocephalic and atraumatic.     Right Ear: Tympanic membrane and external ear normal.     Left Ear: Tympanic membrane and external ear normal.     Nose: Nose normal.  Eyes:     General:        Right eye: No discharge.        Left eye: No discharge.     Conjunctiva/sclera: Conjunctivae normal.     Pupils: Pupils are equal, round, and reactive to light.  Neck:     Thyroid: No thyromegaly.     Vascular: No JVD.  Cardiovascular:     Rate and Rhythm: Normal rate and regular rhythm.     Heart sounds: Normal heart sounds, S1 normal and S2 normal. No murmur heard.    No friction rub. No gallop. No S3 or S4 sounds.  Pulmonary:     Effort: Pulmonary effort is normal.     Breath sounds: Normal breath sounds.  Abdominal:     General: Bowel sounds are normal.     Palpations: Abdomen is soft. There is no mass.     Tenderness: There is no abdominal tenderness. There is no guarding.  Musculoskeletal:        General: Normal  range of motion.     Cervical back: Normal range of motion and neck supple.  Lymphadenopathy:     Cervical: No cervical adenopathy.  Skin:    General: Skin is warm and dry.  Neurological:     Mental Status: She is alert.     Wt Readings from Last 3 Encounters:  11/02/21 235 lb (106.6 kg)  03/02/21 214 lb (97.1 kg)  01/23/21 211 lb (95.7 kg)    BP 120/70   Pulse 80   Ht '5\' 6"'$  (1.676 m)   Wt 235 lb (106.6 kg)   LMP 10/13/2021 (Approximate)   BMI 37.93 kg/m   Assessment and Plan:  1. Iron deficiency anemia, unspecified iron deficiency anemia type Chronic.  Controlled.  Stable.  Will check CBC to confirm current level of control as well as ferritin.  Will supplement as needed. - CBC w/Diff/Platelet - Ferritin  2. Post-surgical hypothyroidism Chronic.  Controlled.  Stable.  Currently on 225 mcg levothyroxine.  Pending thyroid panel and TSH we will determine whether we continue at current dosing. - levothyroxine (SYNTHROID) 200 MCG tablet; TAKE 1 TABLET BY MOUTH DAILY BEFORE BREAKFAST.  Dispense: 90 tablet; Refill: 1 - levothyroxine (SYNTHROID) 25 MCG tablet; Take 1 tablet (25 mcg total) by mouth daily.  Dispense: 90 tablet; Refill: 1 - Thyroid Panel With TSH    Otilio Miu, MD

## 2021-11-03 LAB — CBC WITH DIFFERENTIAL/PLATELET
Basophils Absolute: 0.1 10*3/uL (ref 0.0–0.2)
Basos: 1 %
EOS (ABSOLUTE): 0.1 10*3/uL (ref 0.0–0.4)
Eos: 1 %
Hematocrit: 34 % (ref 34.0–46.6)
Hemoglobin: 10.9 g/dL — ABNORMAL LOW (ref 11.1–15.9)
Immature Grans (Abs): 0 10*3/uL (ref 0.0–0.1)
Immature Granulocytes: 0 %
Lymphocytes Absolute: 2.3 10*3/uL (ref 0.7–3.1)
Lymphs: 23 %
MCH: 22.3 pg — ABNORMAL LOW (ref 26.6–33.0)
MCHC: 32.1 g/dL (ref 31.5–35.7)
MCV: 70 fL — ABNORMAL LOW (ref 79–97)
Monocytes Absolute: 0.7 10*3/uL (ref 0.1–0.9)
Monocytes: 7 %
Neutrophils Absolute: 6.8 10*3/uL (ref 1.4–7.0)
Neutrophils: 68 %
Platelets: 401 10*3/uL (ref 150–450)
RBC: 4.88 x10E6/uL (ref 3.77–5.28)
RDW: 14.8 % (ref 11.7–15.4)
WBC: 10.1 10*3/uL (ref 3.4–10.8)

## 2021-11-03 LAB — THYROID PANEL WITH TSH
Free Thyroxine Index: 2.7 (ref 1.2–4.9)
T3 Uptake Ratio: 28 % (ref 24–39)
T4, Total: 9.6 ug/dL (ref 4.5–12.0)
TSH: 2.43 u[IU]/mL (ref 0.450–4.500)

## 2021-11-03 LAB — FERRITIN: Ferritin: 9 ng/mL — ABNORMAL LOW (ref 15–150)

## 2021-11-12 IMAGING — MG MM DIGITAL SCREENING BILAT W/ TOMO AND CAD
8 series · 8 of 24 positions shown · non-contrast
Comparison: Previous exam(s).

CLINICAL DATA: Screening.

EXAM:
DIGITAL SCREENING BILATERAL MAMMOGRAM WITH TOMOSYNTHESIS AND CAD
TECHNIQUE: Bilateral screening digital craniocaudal and mediolateral oblique
mammograms were obtained. Bilateral screening digital breast
tomosynthesis was performed. The images were evaluated with
computer-aided detection.

[L MLO synth-2D]
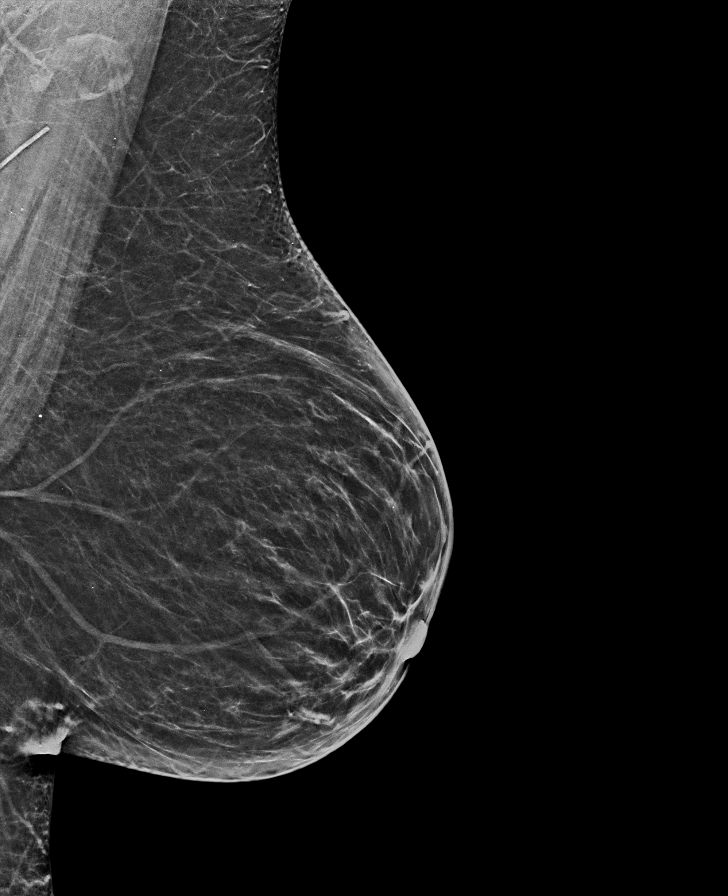

[R MLO synth-2D]
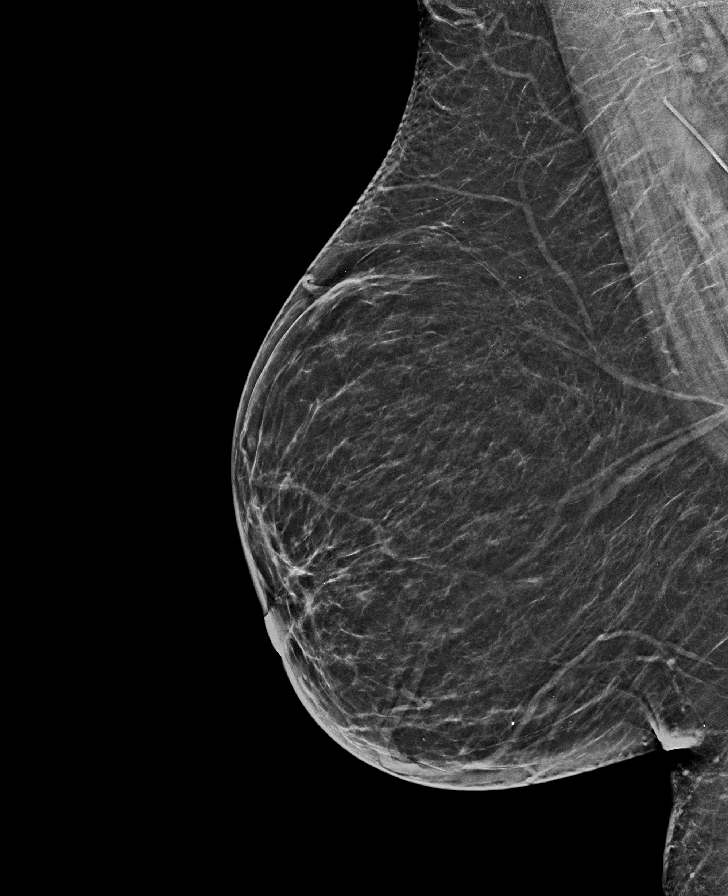

[L CC synth-2D]
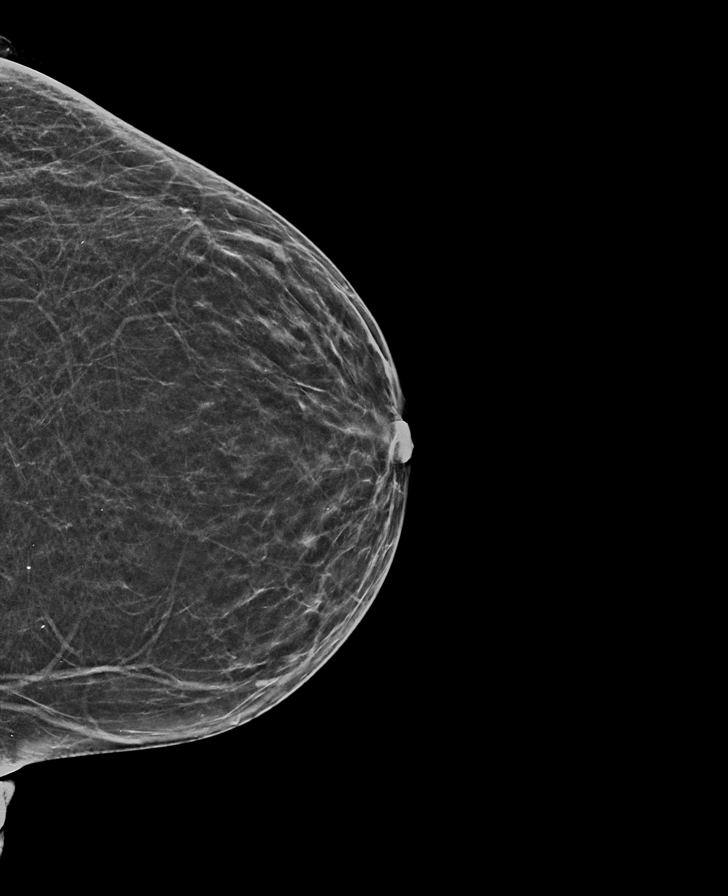

[R CC synth-2D]
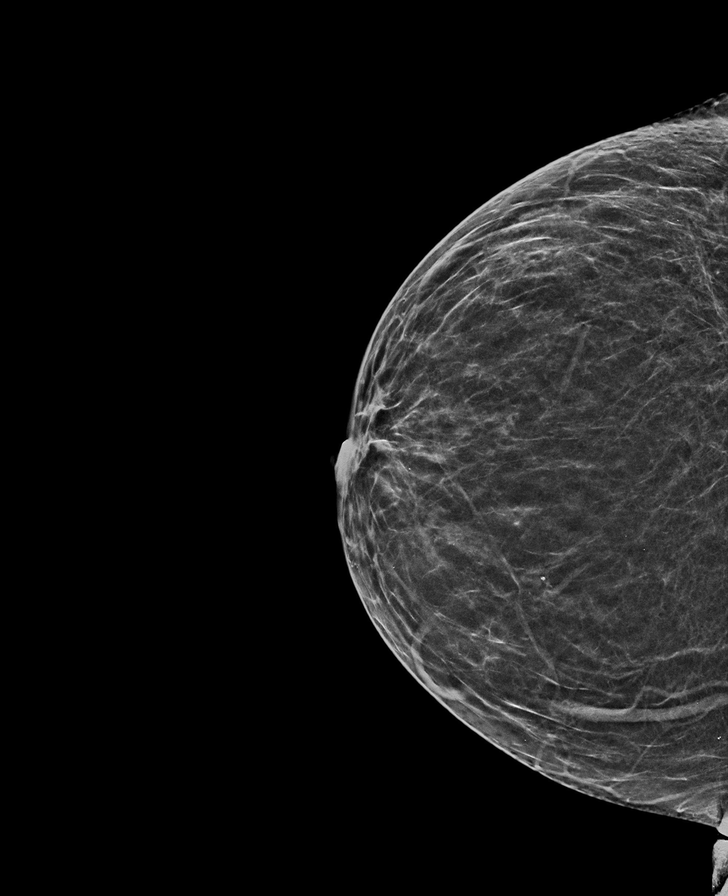

[L CC tomo · tomo slice 27/54.0]
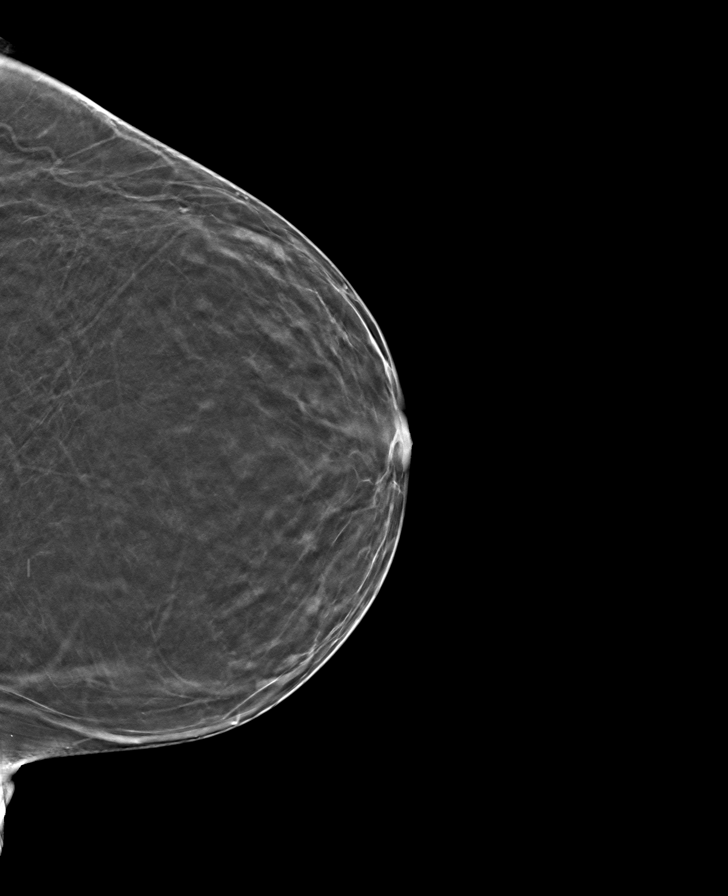

[R CC tomo · tomo slice 29/56.0]
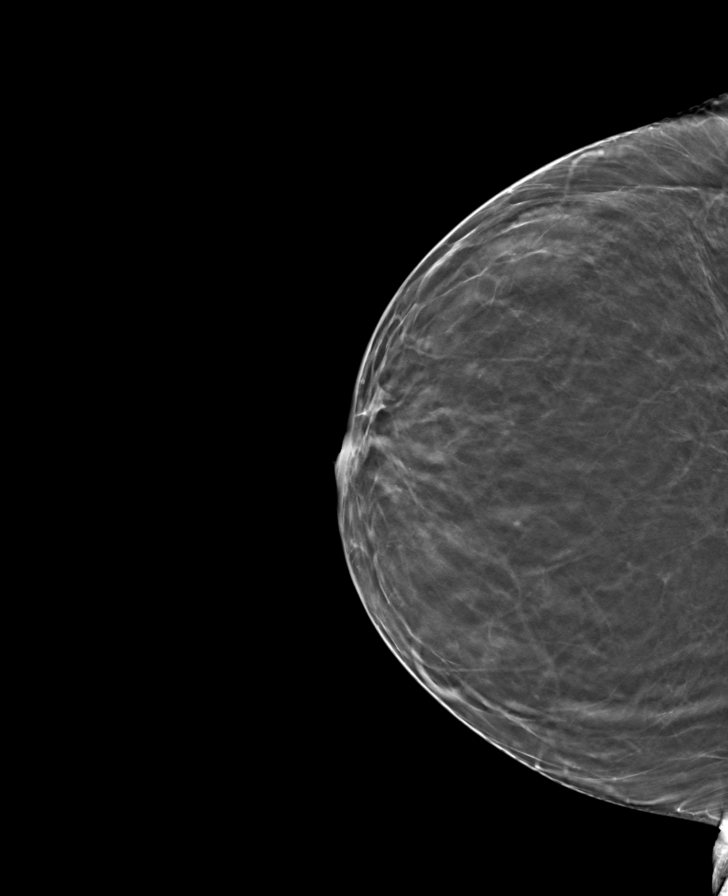

[R MLO tomo · tomo slice 32/63.0]
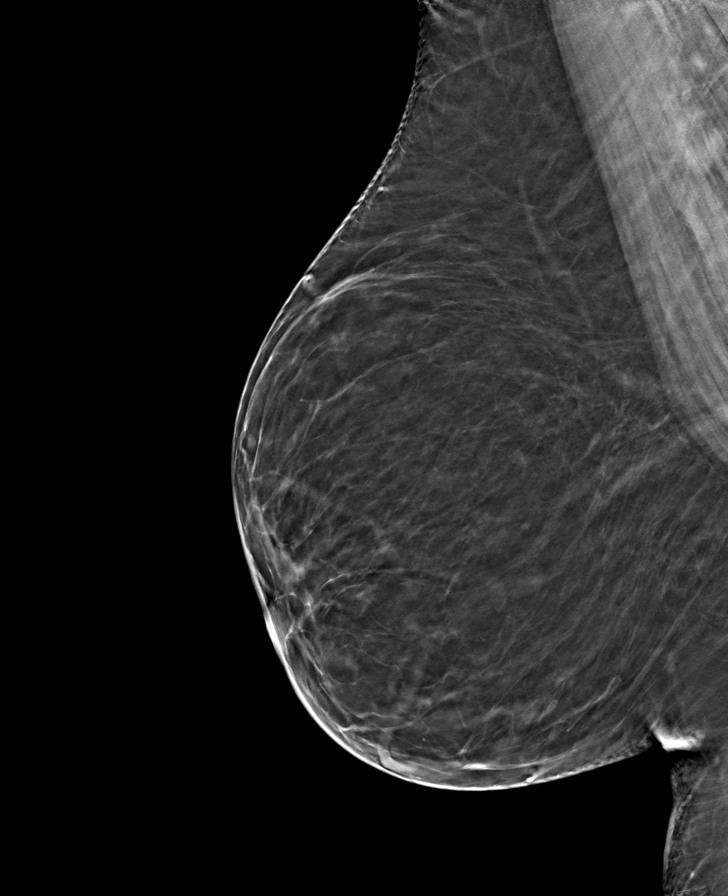

[L MLO tomo · tomo slice 32/63.0]
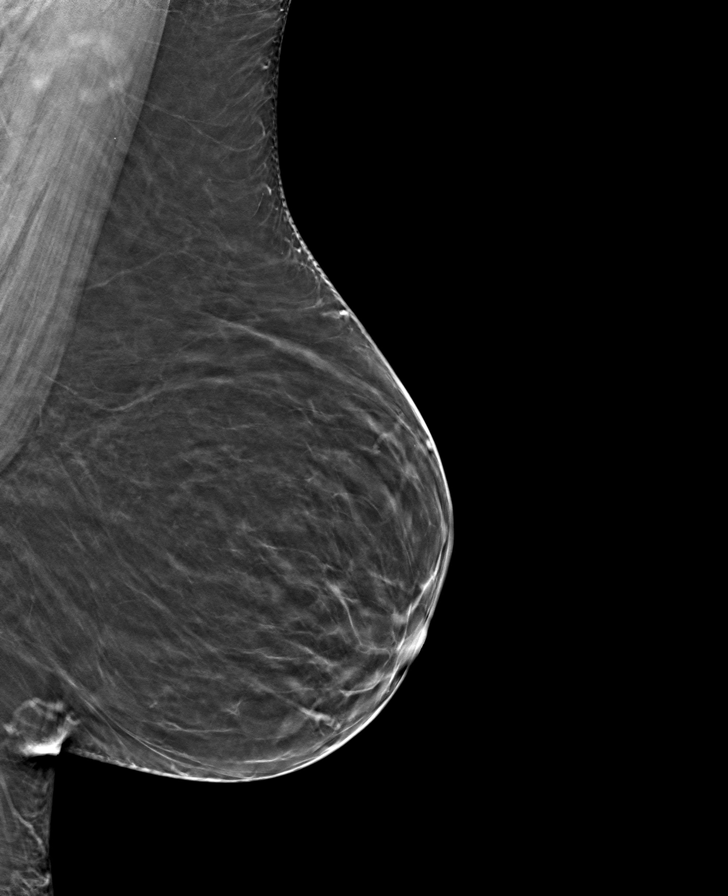

[8 of 24 positions shown; findings below may reference images not displayed]

ACR Breast Density Category b: There are scattered areas of
fibroglandular density.
FINDINGS: There are no findings suspicious for malignancy.
IMPRESSION: No mammographic evidence of malignancy. A result letter of this
screening mammogram will be mailed directly to the patient.

RECOMMENDATION:
Screening mammogram at age 42. (Code:[id number]-G-QW3)

BI-RADS CATEGORY  1: Negative.

## 2022-04-22 ENCOUNTER — Telehealth: Payer: Self-pay

## 2022-04-22 NOTE — Telephone Encounter (Signed)
Pt is scheduled for 05/03/22 @ 140- left message for her to call back and get her in sooner

## 2022-04-26 ENCOUNTER — Encounter: Payer: Self-pay | Admitting: Hematology and Oncology

## 2022-05-03 ENCOUNTER — Ambulatory Visit: Payer: Managed Care, Other (non HMO) | Admitting: Family Medicine

## 2022-05-03 ENCOUNTER — Encounter: Payer: Self-pay | Admitting: Hematology and Oncology

## 2022-05-03 ENCOUNTER — Ambulatory Visit (INDEPENDENT_AMBULATORY_CARE_PROVIDER_SITE_OTHER): Payer: BC Managed Care – PPO | Admitting: Family Medicine

## 2022-05-03 VITALS — BP 118/70 | HR 104 | Ht 66.0 in | Wt 251.0 lb

## 2022-05-03 DIAGNOSIS — E039 Hypothyroidism, unspecified: Secondary | ICD-10-CM | POA: Diagnosis not present

## 2022-05-03 DIAGNOSIS — E209 Hypoparathyroidism, unspecified: Secondary | ICD-10-CM | POA: Diagnosis not present

## 2022-05-03 DIAGNOSIS — E890 Postprocedural hypothyroidism: Secondary | ICD-10-CM | POA: Diagnosis not present

## 2022-05-03 DIAGNOSIS — D509 Iron deficiency anemia, unspecified: Secondary | ICD-10-CM

## 2022-05-03 DIAGNOSIS — C8190 Hodgkin lymphoma, unspecified, unspecified site: Secondary | ICD-10-CM

## 2022-05-03 DIAGNOSIS — R7303 Prediabetes: Secondary | ICD-10-CM

## 2022-05-03 MED ORDER — LEVOTHYROXINE SODIUM 200 MCG PO TABS: ORAL_TABLET | ORAL | 1 refills | Status: DC

## 2022-05-03 MED ORDER — EPINEPHRINE 0.3 MG/0.3ML IJ SOAJ: 0.3000 mg | INTRAMUSCULAR | 1 refills | Status: AC | PRN

## 2022-05-03 MED ORDER — LEVOTHYROXINE SODIUM 25 MCG PO TABS: 25.0000 ug | ORAL_TABLET | Freq: Every day | ORAL | 1 refills | Status: DC

## 2022-05-03 MED ORDER — CALCITRIOL 0.5 MCG PO CAPS: 0.5000 ug | ORAL_CAPSULE | Freq: Two times a day (BID) | ORAL | 1 refills | Status: DC

## 2022-05-03 NOTE — Progress Notes (Signed)
Date:  05/03/2022   Name:  Jo [name]   DOB:  1980/12/11   MRN:  [id number]   Chief Complaint: Medication Refill  Thyroid Problem Presents for follow-up visit. Symptoms include heat intolerance and weight gain. Patient reports no cold intolerance, constipation, dry skin, fatigue, hair loss, menstrual problem, nail problem, palpitations or weight loss. The symptoms have been stable.  Anemia Presents for follow-up visit. There has been no abdominal pain, bruising/bleeding easily, fever, malaise/fatigue, palpitations or weight loss. Signs of blood loss that are not present include hematemesis, hematochezia, melena, menorrhagia and vaginal bleeding. There are no compliance problems.   Diabetes She presents for her follow-up diabetic visit. Diabetes type: prediabetes. There are no hypoglycemic associated symptoms. Pertinent negatives for hypoglycemia include no dizziness. Pertinent negatives for diabetes include no chest pain, no fatigue, no polydipsia, no polyphagia, no polyuria and no weight loss. Symptoms are stable. There are no diabetic complications. There are no known risk factors for coronary artery disease. Current diabetic treatment includes diet. She is following a generally healthy diet.    Lab Results  Component Value Date   NA 137 07/04/2020   K 3.5 07/04/2020   CO2 30 07/04/2020   GLUCOSE 94 07/04/2020   BUN 12 07/04/2020   CREATININE 0.68 07/04/2020   CALCIUM 7.8 (L) 07/04/2020   GFRNONAA >60 07/04/2020   Lab Results  Component Value Date   CHOL 198 03/02/2021   HDL 71 03/02/2021   LDLCALC 104 (H) 03/02/2021   TRIG 132 03/02/2021   CHOLHDL 3.3 04/19/2016   Lab Results  Component Value Date   TSH 2.430 11/02/2021   Lab Results  Component Value Date   HGBA1C 5.4 04/19/2016   Lab Results  Component Value Date   WBC 10.1 11/02/2021   HGB 10.9 (L) 11/02/2021   HCT 34.0 11/02/2021   MCV 70 (L) 11/02/2021   PLT 401 11/02/2021   Lab Results  Component  Value Date   ALT 13 07/04/2020   AST 16 07/04/2020   ALKPHOS 45 07/04/2020   BILITOT 0.3 07/04/2020   Lab Results  Component Value Date   VD25OH 48.4 07/26/2016     Review of Systems  Constitutional:  Positive for weight gain. Negative for fatigue, fever, malaise/fatigue and weight loss.  HENT:  Negative for trouble swallowing.   Eyes:  Negative for visual disturbance.  Respiratory:  Negative for chest tightness, shortness of breath and wheezing.   Cardiovascular:  Negative for chest pain, palpitations and leg swelling.  Gastrointestinal:  Negative for abdominal pain, blood in stool, constipation, hematemesis, hematochezia and melena.  Endocrine: Positive for heat intolerance. Negative for cold intolerance, polydipsia, polyphagia and polyuria.  Genitourinary:  Negative for difficulty urinating, menorrhagia, menstrual problem and vaginal bleeding.  Neurological:  Negative for dizziness.  Hematological:  Negative for adenopathy. Does not bruise/bleed easily.    Patient Active Problem List   Diagnosis Date Noted   Anxiety 08/31/2019   Multinodular goiter 08/31/2019   Iron deficiency anemia 08/31/2019   Prediabetes 12/06/2016   Recurrent major depressive disorder, in partial remission ([place]) 12/05/2016   Tension type headache 04/27/2015   Hypocalcemia 04/17/2015   Hypoparathyroidism ([place]) 04/17/2015   GAD (generalized anxiety disorder) 10/18/2014   RLS (restless legs syndrome) 10/18/2014   Insomnia, persistent 10/18/2014   Migraine without aura and responsive to treatment 10/18/2014   CN (constipation) 10/18/2014   Major depression, recurrent, chronic ([place]) 10/18/2014   Dysmenorrhea 10/18/2014   Female infertility 10/18/2014   Gastro-esophageal  reflux disease without esophagitis 10/18/2014   Allergy, food 10/18/2014   Hodgkin lymphoma, unspecified, unspecified site ([place]) 10/18/2014   H/O thyroidectomy 99991111   Metabolic syndrome 99991111   Morbid obesity, unspecified  obesity type ([place]) 10/18/2014   Low parathyroid level after removal ([place]) 10/18/2014   Vitamin D deficiency 10/18/2014   Hive 10/18/2014   Dermatitis seborrheica 10/18/2014   Arthralgia of temporomandibular joint 10/18/2014   Post-surgical hypothyroidism 10/13/2012    Allergies  Allergen Reactions   Tape Rash    Uncoded Allergy. Allergen: Msg   Barium-Containing Compounds Hives    Oral contrast   Monosodium Glutamate     Unknown.   Other Rash and Other (See Comments)    Contrast dye Uncoded Allergy. Allergen: Msg Contrast dye  Pt states she has no Iodine or Omni IV contrast reaction. She is only allergic to Barium Oral contrast. ASW 09/08/19     Past Surgical History:  Procedure Laterality Date   lymphonodi biopsy     PORT-A-CATH REMOVAL  2004   PORTACATH PLACEMENT  11-24-12   THYROID SURGERY     THYROIDECTOMY  10-13-12   uterine poly removal Left 08/2010   [facility]    Social History   Tobacco Use   Smoking status: Former    Packs/day: 1.00    Years: 0.00    Additional pack years: 0.00    Total pack years: 0.00    Types: Cigarettes    Quit date: 07/27/2009    Years since quitting: 12.7   Smokeless tobacco: Never  Vaping Use   Vaping Use: Never used  Substance Use Topics   Alcohol use: Yes    Alcohol/week: 0.0 standard drinks of alcohol    Comment: rarely   Drug use: No     Medication list has been reviewed and updated.  Current Meds  Medication Sig   Ascorbic Acid (VITAMIN C PO) Take by mouth.   calcitRIOL (ROCALTROL) 0.5 MCG capsule Take 1 capsule (0.5 mcg total) by mouth 2 (two) times daily.   Calcium Citrate 333 MG TABS    EPINEPHrine 0.3 mg/0.3 mL IJ SOAJ injection    fexofenadine (ALLEGRA) 180 MG tablet Take by mouth.   hydrocortisone cream 0.5 %    levothyroxine (SYNTHROID) 200 MCG tablet TAKE 1 TABLET BY MOUTH DAILY BEFORE BREAKFAST.   levothyroxine (SYNTHROID) 25 MCG tablet Take 1 tablet (25 mcg total) by mouth daily.   Multiple Vitamin  (MULTIVITAMIN ADULT PO) Take by mouth.   omeprazole (PRILOSEC OTC) 20 MG tablet Take by mouth.       11/02/2021    3:22 PM 03/02/2021    9:34 AM 06/29/2020    4:29 PM 12/23/2019    8:08 AM  GAD 7 : Generalized Anxiety Score  Nervous, Anxious, on Edge 0 0 0 0  Control/stop worrying 0 0 0 0  Worry too much - different things 0 0 0 0  Trouble relaxing 0 0 0 0  Restless 0 0 0 0  Easily annoyed or irritable 0 0 0 0  Afraid - awful might happen 0 0 0 0  Total GAD 7 Score 0 0 0 0  Anxiety Difficulty Not difficult at all Not difficult at all         11/02/2021    3:22 PM 03/02/2021    9:34 AM 01/23/2021    8:48 AM  Depression screen PHQ 2/9  Decreased Interest 0 0 0  Down, Depressed, Hopeless 0 0 0  PHQ - 2 Score  0 0 0  Altered sleeping 0 0 0  Tired, decreased energy 0 0 0  Change in appetite 0 0 0  Feeling bad or failure about yourself  0 0 0  Trouble concentrating 0 0 0  Moving slowly or fidgety/restless 0 0 0  Suicidal thoughts 0 0 0  PHQ-9 Score 0 0 0  Difficult doing work/chores Not difficult at all Not difficult at all     BP Readings from Last 3 Encounters:  05/03/22 118/70  11/02/21 120/70  03/02/21 106/72    Physical Exam Vitals and nursing note reviewed.  Constitutional:      Appearance: She is well-developed.  HENT:     Head: Normocephalic.     Right Ear: Tympanic membrane and external ear normal.     Left Ear: Tympanic membrane and external ear normal.  Eyes:     General: Lids are everted, no foreign bodies appreciated. No scleral icterus.       Left eye: No foreign body or hordeolum.     Conjunctiva/sclera: Conjunctivae normal.     Right eye: Right conjunctiva is not injected.     Left eye: Left conjunctiva is not injected.     Pupils: Pupils are equal, round, and reactive to light.  Neck:     Thyroid: No thyroid mass, thyromegaly or thyroid tenderness.     Vascular: No JVD.     Trachea: No tracheal deviation.     Comments: No  thyroid Cardiovascular:     Rate and Rhythm: Normal rate and regular rhythm.     Heart sounds: Normal heart sounds. No murmur heard.    No friction rub. No gallop.  Pulmonary:     Effort: Pulmonary effort is normal. No respiratory distress.     Breath sounds: Normal breath sounds. No wheezing, rhonchi or rales.  Abdominal:     General: Bowel sounds are normal.     Palpations: Abdomen is soft. There is no mass.     Tenderness: There is no abdominal tenderness. There is no guarding or rebound.  Musculoskeletal:        General: No tenderness. Normal range of motion.     Cervical back: Normal range of motion and neck supple.  Lymphadenopathy:     Cervical: No cervical adenopathy.     Right cervical: No superficial, deep or posterior cervical adenopathy.    Left cervical: No superficial, deep or posterior cervical adenopathy.  Skin:    General: Skin is warm.     Findings: No rash.  Neurological:     Mental Status: She is alert and oriented to person, place, and time.     Cranial Nerves: No cranial nerve deficit.     Deep Tendon Reflexes: Reflexes normal.  Psychiatric:        Mood and Affect: Mood is not anxious or depressed.     Wt Readings from Last 3 Encounters:  05/03/22 251 lb (113.9 kg)  11/02/21 235 lb (106.6 kg)  03/02/21 214 lb (97.1 kg)    BP 118/70   Pulse (!) 104   Ht 5\' 6"  (1.676 m)   Wt 251 lb (113.9 kg)   SpO2 97%   BMI 40.51 kg/m   Assessment and Plan:  1. Iron deficiency anemia, unspecified iron deficiency anemia type Chronic.  Controlled.  Stable.  Patient is not taking supplemental iron but Gummies over-the-counter.  Will check CBC for current level of hemoglobin and ferritin for adequate control. - CBC w/Diff/Platelet - Ferritin  2.  Hypoparathyroidism, unspecified hypoparathyroidism type ([facility]) Chronic.  Controlled.  Stable.  Will check CMP for calcium and phosphorus.  Patient will continue Calcitrol 0.5 mcg 1 capsule twice a day for control of  calcium. - Comprehensive metabolic panel  3. Acquired hypothyroidism Chronic.  Controlled.  Stable.  On 225 mcg and 2 dosings of levothyroxine.  Will check TSH for level of control. - TSH  4. Hodgkin's lymphoma ([place]) Resolved.  Doing well with no adenopathy no easy bruising we will continue self surveillance. - EPINEPHrine 0.3 mg/0.3 mL IJ SOAJ injection; Inject 0.3 mg into the muscle as needed for anaphylaxis.  Dispense: 1 each; Refill: 1  5. Post-surgical hypothyroidism As noted above. - levothyroxine (SYNTHROID) 200 MCG tablet; TAKE 1 TABLET BY MOUTH DAILY BEFORE BREAKFAST.  Dispense: 90 tablet; Refill: 1 - levothyroxine (SYNTHROID) 25 MCG tablet; Take 1 tablet (25 mcg total) by mouth daily.  Dispense: 90 tablet; Refill: 1 - calcitRIOL (ROCALTROL) 0.5 MCG capsule; Take 1 capsule (0.5 mcg total) by mouth 2 (two) times daily.  Dispense: 180 capsule; Refill: 1  6. Prediabetes Chronic.  Controlled.  Stable.  This is current diet controlled we will check A1c for level of control at this time. - Hemoglobin A1c  7. Hodgkin lymphoma, unspecified Hodgkin lymphoma type, unspecified body region [facility]) As noted above is resolved and we will continue surveillance as needed.   Otilio Miu, MD

## 2022-05-04 ENCOUNTER — Telehealth: Payer: Self-pay | Admitting: Nurse Practitioner

## 2022-05-04 LAB — COMPREHENSIVE METABOLIC PANEL
ALT: 10 IU/L (ref 0–32)
AST: 14 IU/L (ref 0–40)
Albumin/Globulin Ratio: 1.6 (ref 1.2–2.2)
Albumin: 4.1 g/dL (ref 3.9–4.9)
Alkaline Phosphatase: 61 IU/L (ref 44–121)
BUN/Creatinine Ratio: 24 — ABNORMAL HIGH (ref 9–23)
BUN: 16 mg/dL (ref 6–24)
Bilirubin Total: <0.2 mg/dL (ref 0.0–1.2)
CO2: 25 mmol/L (ref 20–29)
Calcium: 6.7 mg/dL — CL (ref 8.7–10.2)
Chloride: 100 mmol/L (ref 96–106)
Creatinine, Ser: 0.68 mg/dL (ref 0.57–1.00)
Globulin, Total: 2.5 g/dL (ref 1.5–4.5)
Glucose: 90 mg/dL (ref 70–99)
Potassium: 4.1 mmol/L (ref 3.5–5.2)
Sodium: 143 mmol/L (ref 134–144)
Total Protein: 6.6 g/dL (ref 6.0–8.5)
eGFR: 113 mL/min/{1.73_m2} (ref >59)

## 2022-05-04 LAB — CBC WITH DIFFERENTIAL/PLATELET
Basophils Absolute: 0.1 10*3/uL (ref 0.0–0.2)
Basos: 1 %
EOS (ABSOLUTE): 0.2 10*3/uL (ref 0.0–0.4)
Eos: 2 %
Hematocrit: 32.3 % — ABNORMAL LOW (ref 34.0–46.6)
Hemoglobin: 9.9 g/dL — ABNORMAL LOW (ref 11.1–15.9)
Immature Grans (Abs): 0 10*3/uL (ref 0.0–0.1)
Immature Granulocytes: 0 %
Lymphocytes Absolute: 2 10*3/uL (ref 0.7–3.1)
Lymphs: 23 %
MCH: 20.9 pg — ABNORMAL LOW (ref 26.6–33.0)
MCHC: 30.7 g/dL — ABNORMAL LOW (ref 31.5–35.7)
MCV: 68 fL — ABNORMAL LOW (ref 79–97)
Monocytes Absolute: 0.7 10*3/uL (ref 0.1–0.9)
Monocytes: 8 %
Neutrophils Absolute: 5.6 10*3/uL (ref 1.4–7.0)
Neutrophils: 66 %
Platelets: 394 10*3/uL (ref 150–450)
RBC: 4.73 x10E6/uL (ref 3.77–5.28)
RDW: 15.5 % — ABNORMAL HIGH (ref 11.7–15.4)
WBC: 8.6 10*3/uL (ref 3.4–10.8)

## 2022-05-04 LAB — FERRITIN: Ferritin: 7 ng/mL — ABNORMAL LOW (ref 15–150)

## 2022-05-04 LAB — TSH: TSH: 0.648 u[IU]/mL (ref 0.450–4.500)

## 2022-05-04 LAB — HEMOGLOBIN A1C
Est. average glucose Bld gHb Est-mCnc: 123 mg/dL
Hgb A1c MFr Bld: 5.9 % — ABNORMAL HIGH (ref 4.8–5.6)

## 2022-05-04 NOTE — Telephone Encounter (Signed)
Received a call from Triage Nurse regarding calcium level of 6.7.  Called patient and let her know to increase her Cacitrol to 38mcg BID until she hears from her PCP next week.

## 2022-05-06 ENCOUNTER — Telehealth: Payer: Self-pay | Admitting: Family Medicine

## 2022-05-06 NOTE — Telephone Encounter (Signed)
Patient said that she will pick up her medications today and will start doubling up on it. Her appt is set up for this Friday.

## 2022-05-10 ENCOUNTER — Ambulatory Visit (INDEPENDENT_AMBULATORY_CARE_PROVIDER_SITE_OTHER): Payer: BC Managed Care – PPO | Admitting: Family Medicine

## 2022-05-10 ENCOUNTER — Encounter: Payer: Self-pay | Admitting: Family Medicine

## 2022-05-10 VITALS — BP 118/78 | HR 74 | Ht 66.0 in | Wt 238.0 lb

## 2022-05-10 DIAGNOSIS — D508 Other iron deficiency anemias: Secondary | ICD-10-CM

## 2022-05-10 DIAGNOSIS — E890 Postprocedural hypothyroidism: Secondary | ICD-10-CM | POA: Diagnosis not present

## 2022-05-10 DIAGNOSIS — E892 Postprocedural hypoparathyroidism: Secondary | ICD-10-CM

## 2022-05-10 MED ORDER — IRON (FERROUS SULFATE) 325 (65 FE) MG PO TABS: 325.0000 mg | ORAL_TABLET | Freq: Every day | ORAL | 5 refills | Status: AC

## 2022-05-10 MED ORDER — CALCITRIOL 0.5 MCG PO CAPS: 1.0000 ug | ORAL_CAPSULE | Freq: Two times a day (BID) | ORAL | 1 refills | Status: AC

## 2022-05-10 NOTE — Patient Instructions (Signed)
Hypoparathyroidism  Hypoparathyroidism is a rare condition that happens when there is not enough of a certain type of hormone in the body. This hormone is called parathyroid hormone (PTH). This hormone is made by the parathyroid glands, which are four small glands on or near the thyroid gland in the neck. This condition causes the calcium levels in your body to become too low and the phosphorus levels in your body to get too high. What are the causes? The most common cause of this condition is the removal or injury of the parathyroid glands during neck surgery. Other causes include: Autoimmune disease. This is when your body attacks and destroys the parathyroid tissue. A genetic condition that can cause the parathyroid glands to be underdeveloped or missing. This is a condition that people are born with. Damage to the parathyroid glands after radiation treatment for cancer or other conditions. What are the signs or symptoms? Symptoms of this condition are usually due to low levels of calcium. Signs may include: Numbness and tingling in the fingers and toes, or around the mouth. Muscle twitching, aches, or cramps, especially in the legs, feet, and back. Fast or irregular heartbeats (palpitations). Memory problems, confusion, or trouble thinking. Depression, anxiety, irritability, or changes in personality. Seizures. Some people may not have any symptoms, especially if the calcium level in their blood is still close to normal. How is this diagnosed? This condition is diagnosed based on your medical history, a physical exam, and tests, including: Blood tests. Urine tests. X-rays or bone density exams to check the health of your bones. Electrocardiogram (ECG) to see how your heart is working. CT scan to look for calcium deposits in your body. How is this treated? This condition is treated with: Supplements, including calcium, vitamin D, and magnesium. Changes in your diet, including: Adding  more foods and drinks with calcium in them, such as dairy products, green vegetables, and fortified cereals and juices. Consuming fewer foods and drinks with a lot of phosphorus in them, such as meats and soda. Eating fewer foods with a lot of salt (sodium) in them, such as canned foods, cured or processed meats, and pre-made or frozen meals. PTH injections. Medicines that help the body get rid of excess fluid through urination (diuretics). IV infusion of calcium. This is done in a hospital and only in severe cases. Your health care provider will need to do regular monitoring of your calcium and phosphorus levels. You may need frequent blood tests at first, then eventually a few blood tests each year. Follow these instructions at home: Eating and drinking Follow instructions from your health care provider about eating or drinking restrictions. Visit a diet and nutrition expert (dietitian)or health care provider to talk about your diet. Take supplements only as told by your health care provider. Constipation prevention You may need to take these actions to prevent or treat constipation: Drink enough fluid to keep your urine pale yellow. Take over-the-counter or prescription medicines. Eat foods that are high in fiber, such as beans, whole grains, and fresh fruits and vegetables. Limit foods that are high in fat and processed sugars, such as fried or sweet foods. General instructions Take over-the-counter and prescription medicines only as told by your health care provider. Keep all follow-up visits. Your health care provider will need to do tests regularly and adjust your treatment as needed. Contact a health care provider if: Your symptoms do not get better with treatment. Your symptoms get worse. You develop any new symptoms. Get help  right away if: You have a seizure. You have trouble breathing. You have severe pain in your abdomen. You have chest pain. You have fast or irregular  heartbeats that do not go away. These symptoms may be an emergency. Get help right away. Call 911. Do not wait to see if the symptoms will go away. Do not drive yourself to the hospital. Summary Hypoparathyroidism is a rare condition. The most common cause of this condition is damage to the parathyroid glands during neck surgery. This condition causes the calcium levels in your body to become too low and the phosphorus levels in your body to get too high. Treatment may include supplements, changes in diet, and injections of PTH. Visit a diet and nutrition expert or health care provider to talk about your diet. This information is not intended to replace advice given to you by your health care provider. Make sure you discuss any questions you have with your health care provider. Document Revised: 03/23/2021 Document Reviewed: 03/23/2021 Elsevier Patient Education  [place].

## 2022-05-10 NOTE — Progress Notes (Signed)
Date:  05/10/2022   Name:  Jo [name]   DOB:  1980-04-24   MRN:  [id number]   Chief Complaint: No chief complaint on file.  Patient is a 42 year old female who presents for a followup hypocalcemia/secondary to parathyroid removal. exam. The patient reports the following problems: Paresthesias with muscle cramps. Health maintenance has been reviewed up-to-date      Anemia Presents for follow-up visit. There has been no abdominal pain, anorexia, bruising/bleeding easily, fever, light-headedness, palpitations or paresthesias. Signs of blood loss that are not present include hematemesis, hematochezia, melena, menorrhagia and vaginal bleeding. There are no compliance problems.     Lab Results  Component Value Date   NA 143 05/03/2022   K 4.1 05/03/2022   CO2 25 05/03/2022   GLUCOSE 90 05/03/2022   BUN 16 05/03/2022   CREATININE 0.68 05/03/2022   CALCIUM 6.7 (LL) 05/03/2022   EGFR 113 05/03/2022   GFRNONAA >60 07/04/2020   Lab Results  Component Value Date   CHOL 198 03/02/2021   HDL 71 03/02/2021   LDLCALC 104 (H) 03/02/2021   TRIG 132 03/02/2021   CHOLHDL 3.3 04/19/2016   Lab Results  Component Value Date   TSH 0.648 05/03/2022   Lab Results  Component Value Date   HGBA1C 5.9 (H) 05/03/2022   Lab Results  Component Value Date   WBC 8.6 05/03/2022   HGB 9.9 (L) 05/03/2022   HCT 32.3 (L) 05/03/2022   MCV 68 (L) 05/03/2022   PLT 394 05/03/2022   Lab Results  Component Value Date   ALT 10 05/03/2022   AST 14 05/03/2022   ALKPHOS 61 05/03/2022   BILITOT <0.2 05/03/2022   Lab Results  Component Value Date   VD25OH 48.4 07/26/2016     Review of Systems  Constitutional: Negative.  Negative for chills, fatigue, fever and unexpected weight change.  HENT:  Negative for congestion, ear discharge, ear pain, rhinorrhea, sinus pressure, sneezing and sore throat.   Respiratory:  Negative for cough, shortness of breath, wheezing and stridor.   Cardiovascular:   Negative for chest pain and palpitations.  Gastrointestinal:  Negative for abdominal pain, anorexia, blood in stool, constipation, diarrhea, hematemesis, hematochezia, melena and nausea.  Genitourinary:  Negative for dysuria, flank pain, frequency, hematuria, menorrhagia, urgency, vaginal bleeding and vaginal discharge.  Musculoskeletal:  Negative for arthralgias, back pain and myalgias.  Skin:  Negative for rash.  Neurological:  Negative for dizziness, weakness, light-headedness, headaches and paresthesias.  Hematological:  Negative for adenopathy. Does not bruise/bleed easily.  Psychiatric/Behavioral:  Negative for dysphoric mood. The patient is not nervous/anxious.     Patient Active Problem List   Diagnosis Date Noted   Anxiety 08/31/2019   Multinodular goiter 08/31/2019   Iron deficiency anemia 08/31/2019   Prediabetes 12/06/2016   Recurrent major depressive disorder, in partial remission ([place]) 12/05/2016   Tension type headache 04/27/2015   Hypocalcemia 04/17/2015   Hypoparathyroidism ([place]) 04/17/2015   GAD (generalized anxiety disorder) 10/18/2014   RLS (restless legs syndrome) 10/18/2014   Insomnia, persistent 10/18/2014   Migraine without aura and responsive to treatment 10/18/2014   CN (constipation) 10/18/2014   Major depression, recurrent, chronic ([place]) 10/18/2014   Dysmenorrhea 10/18/2014   Female infertility 10/18/2014   Gastro-esophageal reflux disease without esophagitis 10/18/2014   Allergy, food 10/18/2014   Hodgkin lymphoma, unspecified, unspecified site ([place]) 10/18/2014   H/O thyroidectomy 99991111   Metabolic syndrome 99991111   Morbid obesity, unspecified obesity type ([place]) 10/18/2014  Low parathyroid level after removal (HCC) 10/18/2014   Vitamin D deficiency 10/18/2014   Hive 10/18/2014   Dermatitis seborrheica 10/18/2014   Arthralgia of temporomandibular joint 10/18/2014   Post-surgical hypothyroidism 10/13/2012    Allergies  Allergen Reactions    Tape Rash    Uncoded Allergy. Allergen: Msg   Barium-Containing Compounds Hives    Oral contrast   Monosodium Glutamate     Unknown.   Other Rash and Other (See Comments)    Contrast dye Uncoded Allergy. Allergen: Msg Contrast dye  Pt states she has no Iodine or Omni IV contrast reaction. She is only allergic to Barium Oral contrast. ASW 09/08/19     Past Surgical History:  Procedure Laterality Date   lymphonodi biopsy     PORT-A-CATH REMOVAL  2004   PORTACATH PLACEMENT  11-24-12   THYROID SURGERY     THYROIDECTOMY  10-13-12   uterine poly removal Left 08/2010   [facility]    Social History   Tobacco Use   Smoking status: Former    Packs/day: 1.00    Years: 0.00    Additional pack years: 0.00    Total pack years: 0.00    Types: Cigarettes    Quit date: 07/27/2009    Years since quitting: 12.7   Smokeless tobacco: Never  Vaping Use   Vaping Use: Never used  Substance Use Topics   Alcohol use: Yes    Alcohol/week: 0.0 standard drinks of alcohol    Comment: rarely   Drug use: No     Medication list has been reviewed and updated.  Current Meds  Medication Sig   Ascorbic Acid (VITAMIN C PO) Take by mouth.   calcitRIOL (ROCALTROL) 0.5 MCG capsule Take 1 capsule (0.5 mcg total) by mouth 2 (two) times daily.   Calcium Citrate 333 MG TABS    EPINEPHrine 0.3 mg/0.3 mL IJ SOAJ injection Inject 0.3 mg into the muscle as needed for anaphylaxis.   fexofenadine (ALLEGRA) 180 MG tablet Take by mouth.   hydrocortisone cream 0.5 %    levothyroxine (SYNTHROID) 200 MCG tablet TAKE 1 TABLET BY MOUTH DAILY BEFORE BREAKFAST.   levothyroxine (SYNTHROID) 25 MCG tablet Take 1 tablet (25 mcg total) by mouth daily.   Multiple Vitamin (MULTIVITAMIN ADULT PO) Take by mouth.   omeprazole (PRILOSEC OTC) 20 MG tablet Take by mouth.       05/10/2022   10:45 AM 11/02/2021    3:22 PM 03/02/2021    9:34 AM 06/29/2020    4:29 PM  GAD 7 : Generalized Anxiety Score  Nervous, Anxious, on Edge 0 0 0  0  Control/stop worrying 0 0 0 0  Worry too much - different things 0 0 0 0  Trouble relaxing 0 0 0 0  Restless 0 0 0 0  Easily annoyed or irritable 0 0 0 0  Afraid - awful might happen 0 0 0 0  Total GAD 7 Score 0 0 0 0  Anxiety Difficulty Not difficult at all Not difficult at all Not difficult at all        05/10/2022   10:45 AM 11/02/2021    3:22 PM 03/02/2021    9:34 AM  Depression screen PHQ 2/9  Decreased Interest 0 0 0  Down, Depressed, Hopeless 0 0 0  PHQ - 2 Score 0 0 0  Altered sleeping 0 0 0  Tired, decreased energy 0 0 0  Change in appetite 0 0 0  Feeling bad or failure about yourself  0 0 0  Trouble concentrating 0 0 0  Moving slowly or fidgety/restless 0 0 0  Suicidal thoughts 0 0 0  PHQ-9 Score 0 0 0  Difficult doing work/chores Not difficult at all Not difficult at all Not difficult at all    BP Readings from Last 3 Encounters:  05/10/22 118/78  05/03/22 118/70  11/02/21 120/70    Physical Exam Vitals and nursing note reviewed. Exam conducted with a chaperone present.  Constitutional:      General: She is not in acute distress.    Appearance: She is not diaphoretic.  HENT:     Head: Normocephalic and atraumatic.     Right Ear: Tympanic membrane and external ear normal.     Left Ear: Tympanic membrane and external ear normal.     Nose: Nose normal.     Mouth/Throat:     Mouth: Mucous membranes are moist.  Eyes:     General:        Right eye: No discharge.        Left eye: No discharge.     Conjunctiva/sclera: Conjunctivae normal.     Pupils: Pupils are equal, round, and reactive to light.  Neck:     Thyroid: No thyromegaly.     Vascular: No JVD.  Cardiovascular:     Rate and Rhythm: Normal rate and regular rhythm.     Heart sounds: Normal heart sounds. No murmur heard.    No friction rub. No gallop.  Pulmonary:     Effort: Pulmonary effort is normal.     Breath sounds: Normal breath sounds. No wheezing or rhonchi.  Abdominal:     General:  Bowel sounds are normal.     Palpations: Abdomen is soft. There is no mass.     Tenderness: There is no abdominal tenderness. There is no guarding.  Musculoskeletal:        General: Normal range of motion.     Cervical back: Normal range of motion and neck supple.  Lymphadenopathy:     Cervical: No cervical adenopathy.  Skin:    General: Skin is warm and dry.  Neurological:     Mental Status: She is alert.     Wt Readings from Last 3 Encounters:  05/10/22 238 lb (108 kg)  05/03/22 251 lb (113.9 kg)  11/02/21 235 lb (106.6 kg)    BP 118/78   Pulse 74   Ht 5\' 6"  (1.676 m)   Wt 238 lb (108 kg)   LMP 04/18/2022 (Approximate)   SpO2 98%   BMI 38.41 kg/m   Assessment and Plan:  1. Post-surgical hypothyroidism Chronic.  Controlled.  Stable.  Patient had thyroidectomy several years ago in which they "left the parathyroid however only thing 1 was left behind and it was probably scarred due to radiation.  As noted below we will increase her calcium supplementation is that it has gotten into the 6 range and we will recheck calcium in 6 weeks.  2. Hypoparathyroidism after surgical removal of thyroid gland (HCC) Chronic.  Controlled.  Stable.  Given the low calcium we will check a phosphorus to see if this is reciprocal than increased as well.  In the meantime we will take in consideration that we may need to refer back to endocrinology and that she saw Dr. Gabriel Carina in the past and that this may be need to be rescheduled with endocrinology. - calcitRIOL (ROCALTROL) 0.5 MCG capsule; Take 2 capsules (1 mcg total) by mouth 2 (two) times  daily.  Dispense: 120 capsule; Refill: 1 - Phosphorus  3. Iron deficiency anemia secondary to inadequate dietary iron intake As noted hemoglobin was low and patient has been unable to take iron I have reemphasized to take ferrous sulfate 325 mg once a day and will recheck in 6 weeks when we recheck the calcium. - Iron, Ferrous Sulfate, 325 (65 Fe) MG TABS; Take  325 mg by mouth daily.  Dispense: 30 tablet; Refill: 5    Otilio Miu, MD

## 2022-05-11 LAB — PHOSPHORUS: Phosphorus: 3.5 mg/dL (ref 3.0–4.3)

## 2022-05-16 ENCOUNTER — Encounter: Payer: Self-pay | Admitting: Family Medicine

## 2022-05-17 ENCOUNTER — Other Ambulatory Visit (INDEPENDENT_AMBULATORY_CARE_PROVIDER_SITE_OTHER): Payer: BC Managed Care – PPO

## 2022-05-17 DIAGNOSIS — D509 Iron deficiency anemia, unspecified: Secondary | ICD-10-CM | POA: Diagnosis not present

## 2022-05-17 LAB — HEMOCCULT GUIAC POC 1CARD (OFFICE)
Card #2 Fecal Occult Blod, POC: NEGATIVE
Card #3 Fecal Occult Blood, POC: NEGATIVE
Fecal Occult Blood, POC: NEGATIVE

## 2022-05-17 NOTE — Progress Notes (Signed)
3 out of 3 negative hemoccults 

## 2022-09-20 ENCOUNTER — Encounter: Payer: Self-pay | Admitting: Family Medicine

## 2022-09-20 ENCOUNTER — Ambulatory Visit: Payer: BC Managed Care – PPO | Admitting: Family Medicine

## 2022-09-20 ENCOUNTER — Other Ambulatory Visit: Admission: RE | Admit: 2022-09-20 | Payer: BC Managed Care – PPO | Source: Home / Self Care

## 2022-09-20 ENCOUNTER — Ambulatory Visit
Admission: RE | Admit: 2022-09-20 | Discharge: 2022-09-20 | Disposition: A | Discharge status: Home / Self Care | Payer: BC Managed Care – PPO | Source: Ambulatory Visit | Attending: Family Medicine | Admitting: Family Medicine

## 2022-09-20 ENCOUNTER — Ambulatory Visit
Admission: RE | Admit: 2022-09-20 | Discharge: 2022-09-20 | Disposition: A | Discharge status: Home / Self Care | Payer: BC Managed Care – PPO | Attending: Family Medicine | Admitting: Family Medicine

## 2022-09-20 ENCOUNTER — Encounter: Payer: Self-pay | Admitting: Hematology and Oncology

## 2022-09-20 VITALS — BP 130/76 | HR 104 | Ht 66.0 in | Wt 234.0 lb

## 2022-09-20 DIAGNOSIS — Z9221 Personal history of antineoplastic chemotherapy: Secondary | ICD-10-CM | POA: Insufficient documentation

## 2022-09-20 DIAGNOSIS — M542 Cervicalgia: Secondary | ICD-10-CM | POA: Diagnosis not present

## 2022-09-20 DIAGNOSIS — E890 Postprocedural hypothyroidism: Secondary | ICD-10-CM

## 2022-09-20 DIAGNOSIS — R0789 Other chest pain: Secondary | ICD-10-CM | POA: Diagnosis not present

## 2022-09-20 DIAGNOSIS — R0602 Shortness of breath: Secondary | ICD-10-CM | POA: Diagnosis not present

## 2022-09-20 DIAGNOSIS — C8190 Hodgkin lymphoma, unspecified, unspecified site: Secondary | ICD-10-CM | POA: Insufficient documentation

## 2022-09-20 DIAGNOSIS — C8192 Hodgkin lymphoma, unspecified, intrathoracic lymph nodes: Secondary | ICD-10-CM | POA: Diagnosis not present

## 2022-09-20 DIAGNOSIS — Z923 Personal history of irradiation: Secondary | ICD-10-CM | POA: Diagnosis not present

## 2022-09-20 DIAGNOSIS — E892 Postprocedural hypoparathyroidism: Secondary | ICD-10-CM | POA: Diagnosis not present

## 2022-09-20 DIAGNOSIS — Z8571 Personal history of Hodgkin lymphoma: Secondary | ICD-10-CM

## 2022-09-20 DIAGNOSIS — R0600 Dyspnea, unspecified: Secondary | ICD-10-CM | POA: Diagnosis not present

## 2022-09-20 LAB — CBC WITH DIFFERENTIAL/PLATELET
Abs Immature Granulocytes: 0.04 10*3/uL (ref 0.00–0.07)
Basophils Absolute: 0.1 10*3/uL (ref 0.0–0.1)
Basophils Relative: 1 %
Eosinophils Absolute: 0.3 10*3/uL (ref 0.0–0.5)
Eosinophils Relative: 2 %
HCT: 34.9 % — ABNORMAL LOW (ref 36.0–46.0)
Hemoglobin: 10.9 g/dL — ABNORMAL LOW (ref 12.0–15.0)
Immature Granulocytes: 0 %
Lymphocytes Relative: 18 %
Lymphs Abs: 2.4 10*3/uL (ref 0.7–4.0)
MCH: 21.9 pg — ABNORMAL LOW (ref 26.0–34.0)
MCHC: 31.2 g/dL (ref 30.0–36.0)
MCV: 70.2 fL — ABNORMAL LOW (ref 80.0–100.0)
Monocytes Absolute: 0.9 10*3/uL (ref 0.1–1.0)
Monocytes Relative: 7 %
Neutro Abs: 9.5 10*3/uL — ABNORMAL HIGH (ref 1.7–7.7)
Neutrophils Relative %: 72 %
Platelets: 395 10*3/uL (ref 150–400)
RBC: 4.97 MIL/uL (ref 3.87–5.11)
RDW: 16.1 % — ABNORMAL HIGH (ref 11.5–15.5)
WBC: 13.3 10*3/uL — ABNORMAL HIGH (ref 4.0–10.5)
nRBC: 0 % (ref 0.0–0.2)

## 2022-09-20 LAB — LIPID PANEL
Cholesterol: 179 mg/dL (ref 0–200)
HDL: 65 mg/dL (ref >40)
LDL Cholesterol: 85 mg/dL (ref 0–99)
Total CHOL/HDL Ratio: 2.8 RATIO
Triglycerides: 144 mg/dL (ref <150)
VLDL: 29 mg/dL (ref 0–40)

## 2022-09-20 LAB — RENAL FUNCTION PANEL
Albumin: 3.9 g/dL (ref 3.5–5.0)
Anion gap: 12 (ref 5–15)
BUN: 12 mg/dL (ref 6–20)
CO2: 26 mmol/L (ref 22–32)
Calcium: 7.2 mg/dL — ABNORMAL LOW (ref 8.9–10.3)
Chloride: 97 mmol/L — ABNORMAL LOW (ref 98–111)
Creatinine, Ser: 0.68 mg/dL (ref 0.44–1.00)
GFR, Estimated: >60 mL/min (ref >60)
Glucose, Bld: 91 mg/dL (ref 70–99)
Phosphorus: 4.5 mg/dL (ref 2.5–4.6)
Potassium: 3.6 mmol/L (ref 3.5–5.1)
Sodium: 135 mmol/L (ref 135–145)

## 2022-09-20 LAB — T4, FREE: Free T4: 0.9 ng/dL (ref 0.61–1.12)

## 2022-09-20 LAB — TSH: TSH: 0.941 u[IU]/mL (ref 0.350–4.500)

## 2022-09-20 NOTE — Progress Notes (Unsigned)
Date:  09/20/2022   Name:  Jo [name]   DOB:  August 30, 1980   MRN:  [id number]   Chief Complaint: Shortness of Breath  Shortness of Breath This is a new problem. The current episode started 1 to 4 weeks ago (2 weeks). The problem occurs every few minutes (couple times an hour). Associated symptoms include chest pain. Pertinent negatives include no abdominal pain, leg swelling, orthopnea, PND or wheezing. The symptoms are aggravated by any activity and emotional upset. She has tried rest for the symptoms.  Chest Pain  This is a new problem. The current episode started 1 to 4 weeks ago (2 weeks). The problem has been unchanged. Pain location: "pressure/heaviness" bilateral anterior neck. The pain radiates to the left neck and right neck. Associated symptoms include exertional chest pressure, palpitations and shortness of breath. Pertinent negatives include no abdominal pain, diaphoresis, irregular heartbeat, orthopnea or PND. The pain is aggravated by emotional upset. She has tried rest for the symptoms. The treatment provided mild relief. Risk factors include obesity and smoking/tobacco exposure (10 pyh).    Lab Results  Component Value Date   NA 143 05/03/2022   K 4.1 05/03/2022   CO2 25 05/03/2022   GLUCOSE 90 05/03/2022   BUN 16 05/03/2022   CREATININE 0.68 05/03/2022   CALCIUM 6.7 (LL) 05/03/2022   EGFR 113 05/03/2022   GFRNONAA >60 07/04/2020   Lab Results  Component Value Date   CHOL 198 03/02/2021   HDL 71 03/02/2021   LDLCALC 104 (H) 03/02/2021   TRIG 132 03/02/2021   CHOLHDL 3.3 04/19/2016   Lab Results  Component Value Date   TSH 0.648 05/03/2022   Lab Results  Component Value Date   HGBA1C 5.9 (H) 05/03/2022   Lab Results  Component Value Date   WBC 8.6 05/03/2022   HGB 9.9 (L) 05/03/2022   HCT 32.3 (L) 05/03/2022   MCV 68 (L) 05/03/2022   PLT 394 05/03/2022   Lab Results  Component Value Date   ALT 10 05/03/2022   AST 14 05/03/2022   ALKPHOS 61  05/03/2022   BILITOT <0.2 05/03/2022   Lab Results  Component Value Date   VD25OH 48.4 07/26/2016     Review of Systems  Constitutional:  Negative for diaphoresis and fatigue.  Respiratory:  Positive for choking, chest tightness and shortness of breath. Negative for wheezing.        Pressure/neck tightness  Cardiovascular:  Positive for chest pain and palpitations. Negative for orthopnea, leg swelling and PND.  Gastrointestinal:  Negative for abdominal pain.    Patient Active Problem List   Diagnosis Date Noted   Anxiety 08/31/2019   Multinodular goiter 08/31/2019   Iron deficiency anemia 08/31/2019   Prediabetes 12/06/2016   Recurrent major depressive disorder, in partial remission ([facility]) 12/05/2016   Tension type headache 04/27/2015   Hypocalcemia 04/17/2015   Hypoparathyroidism ([facility]CC) 04/17/2015   GAD (generalized anxiety disorder) 10/18/2014   RLS (restless legs syndrome) 10/18/2014   Insomnia, persistent 10/18/2014   Migraine without aura and responsive to treatment 10/18/2014   CN (constipation) 10/18/2014   Major depression, recurrent, chronic (HCC) 10/18/2014   Dysmenorrhea 10/18/2014   Female infertility 10/18/2014   Gastro-esophageal reflux disease without esophagitis 10/18/2014   Allergy, food 10/18/2014   Hodgkin lymphoma, unspecified, unspecified site (HCC) 10/18/2014   H/O thyroidectomy 10/18/2014   Metabolic syndrome 10/18/2014   Morbid obesity, unspecified obesity type (HCC) 10/18/2014   Low parathyroid level after removal (HCC) 10/18/2014  Vitamin D deficiency 10/18/2014   Hive 10/18/2014   Dermatitis seborrheica 10/18/2014   Arthralgia of temporomandibular joint 10/18/2014   Post-surgical hypothyroidism 10/13/2012    Allergies  Allergen Reactions   Tape Rash    Uncoded Allergy. Allergen: Msg   Barium-Containing Compounds Hives    Oral contrast   Monosodium Glutamate     Unknown.   Other Rash and Other (See Comments)    Contrast dye Uncoded  Allergy. Allergen: Msg Contrast dye  Pt states she has no Iodine or Omni IV contrast reaction. She is only allergic to Barium Oral contrast. ASW 09/08/19     Past Surgical History:  Procedure Laterality Date   lymphonodi biopsy     PORT-A-CATH REMOVAL  2004   PORTACATH PLACEMENT  11-24-12   THYROID SURGERY     THYROIDECTOMY  10-13-12   uterine poly removal Left 08/2010   [facility]    Social History   Tobacco Use   Smoking status: Former    Current packs/day: 0.00    Types: Cigarettes    Start date: 07/27/2009    Quit date: 07/27/2009    Years since quitting: 13.1   Smokeless tobacco: Never  Vaping Use   Vaping status: Never Used  Substance Use Topics   Alcohol use: Yes    Alcohol/week: 0.0 standard drinks of alcohol    Comment: rarely   Drug use: No     Medication list has been reviewed and updated.  Current Meds  Medication Sig   Ascorbic Acid (VITAMIN C PO) Take by mouth.   calcitRIOL (ROCALTROL) 0.5 MCG capsule Take 2 capsules (1 mcg total) by mouth 2 (two) times daily.   Calcium Citrate 333 MG TABS    EPINEPHrine 0.3 mg/0.3 mL IJ SOAJ injection Inject 0.3 mg into the muscle as needed for anaphylaxis.   fexofenadine (ALLEGRA) 180 MG tablet Take by mouth.   hydrocortisone cream 0.5 %    Iron, Ferrous Sulfate, 325 (65 Fe) MG TABS Take 325 mg by mouth daily.   levothyroxine (SYNTHROID) 200 MCG tablet TAKE 1 TABLET BY MOUTH DAILY BEFORE BREAKFAST.   levothyroxine (SYNTHROID) 25 MCG tablet Take 1 tablet (25 mcg total) by mouth daily.   Multiple Vitamin (MULTIVITAMIN ADULT PO) Take by mouth.   omeprazole (PRILOSEC OTC) 20 MG tablet Take by mouth.       09/20/2022    3:57 PM 05/10/2022   10:45 AM 11/02/2021    3:22 PM 03/02/2021    9:34 AM  GAD 7 : Generalized Anxiety Score  Nervous, Anxious, on Edge 0 0 0 0  Control/stop worrying 0 0 0 0  Worry too much - different things 0 0 0 0  Trouble relaxing 0 0 0 0  Restless 0 0 0 0  Easily annoyed or irritable 0 0 0 0  Afraid -  awful might happen 0 0 0 0  Total GAD 7 Score 0 0 0 0  Anxiety Difficulty Not difficult at all Not difficult at all Not difficult at all Not difficult at all       09/20/2022    3:56 PM 05/10/2022   10:45 AM 11/02/2021    3:22 PM  Depression screen PHQ 2/9  Decreased Interest 0 0 0  Down, Depressed, Hopeless 0 0 0  PHQ - 2 Score 0 0 0  Altered sleeping 0 0 0  Tired, decreased energy 0 0 0  Change in appetite 0 0 0  Feeling bad or failure about yourself  0  0 0  Trouble concentrating 0 0 0  Moving slowly or fidgety/restless 0 0 0  Suicidal thoughts 0 0 0  PHQ-9 Score 0 0 0  Difficult doing work/chores Not difficult at all Not difficult at all Not difficult at all    BP Readings from Last 3 Encounters:  09/20/22 130/76  05/10/22 118/78  05/03/22 118/70    Physical Exam HENT:     Head: Normocephalic.     Mouth/Throat:     Mouth: Mucous membranes are moist.  Eyes:     Pupils: Pupils are equal, round, and reactive to light.  Neck:     Thyroid: No thyroid mass or thyromegaly.     Vascular: No hepatojugular reflux or JVD.     Trachea: Trachea normal. No tracheal deviation.  Cardiovascular:     Rate and Rhythm: Normal rate.     Pulses: No decreased pulses.     Heart sounds: Normal heart sounds. No murmur heard. Pulmonary:     Effort: Pulmonary effort is normal.     Breath sounds: Normal breath sounds. No stridor. No decreased breath sounds, wheezing, rhonchi or rales.  Musculoskeletal:     Cervical back: No muscular tenderness.  Lymphadenopathy:     Head:     Right side of head: No submandibular or tonsillar adenopathy.     Left side of head: No submandibular or tonsillar adenopathy.     Cervical: No cervical adenopathy.     Right cervical: No superficial, deep or posterior cervical adenopathy.    Left cervical: No superficial, deep or posterior cervical adenopathy.  Neurological:     Mental Status: She is alert.     Wt Readings from Last 3 Encounters:  09/20/22 234  lb (106.1 kg)  05/10/22 238 lb (108 kg)  05/03/22 251 lb (113.9 kg)    BP 130/76   Pulse (!) 104   Ht 5\' 6"  (1.676 m)   Wt 234 lb (106.1 kg)   LMP 09/09/2022 (Approximate)   SpO2 99%   BMI 37.77 kg/m   Assessment and Plan: 1. Atypical chest pain New onset.  Episodic.  Relatively stable.  Patient's been having and feeling going up bilateral anterior cervical described as tightness or pressure.  No association with nausea or dyspnea has but there may have been some palpitations.  EKG was done with the following results: Sinus rhythm rate 99 intervals normal.  No voltage changes to suggest LVH.  There is suggestion of an old anterior infarct presumably with delay in R wave progression in anterior leads.  There are no Q waves or ST-T wave changes except for inverted T and lead III.  Compared to EKG on 2009 which also noted a similar anterior infarct suggestion.  Will refer to cardiology for consultation and obtain chest x-ray. - EKG 12-Lead - DG Chest 2 View  2. Shortness of breath at rest New onset.  Patient feels as if she can get a deep inspiration.  Described as pressure in the substernal area.  Chest x-ray was done which notes no active cardiopulmonary disease.  No mediastinal adenopathy.  1 calcified lymph node was noted which I am not certain of level of concern.  Will check lipid panel. - DG Chest 2 View - Renal Function Panel - Lipid Panel With LDL/HDL Ratio  3. Hypocalcemia Patient has had low calcium in the past and was recently on labs today calcium was decreased to 7.2 which is not in critical range but is low along with a  normal phosphorus.  Patient has had total parathyroid removal by history but I cannot confirm this.  Patient is currently taking Calcitrol 0.52 capsules twice a day.  Will refer to endocrinology for assistance in adjusting this dosage.  4. Hypoparathyroidism after surgical removal of thyroid gland (HCC) See above. - Thyroid Panel With TSH - Renal Function  Panel  5. History of total thyroidectomy Patient also has total thyroidectomy which is currently on 225 mcg Synthroid.  6. History of Hodgkin's lymphoma Patient has history of Hodgkin's lymphoma with the original diagnosis and after treatment recurrence x 1.  Patient has undergone chemotherapy and radiation therapy to cervical area.  We will obtain an ultrasound of the soft tissue cervical to evaluate for adenopathy due to sensation of pressure in the cervical aspect.    Elizabeth Sauer, MD

## 2022-09-23 ENCOUNTER — Encounter: Payer: Self-pay | Admitting: Family Medicine

## 2022-09-23 ENCOUNTER — Other Ambulatory Visit: Payer: Self-pay

## 2022-09-23 DIAGNOSIS — E892 Postprocedural hypoparathyroidism: Secondary | ICD-10-CM

## 2022-09-23 DIAGNOSIS — R0789 Other chest pain: Secondary | ICD-10-CM

## 2022-09-23 DIAGNOSIS — R0602 Shortness of breath: Secondary | ICD-10-CM

## 2022-09-23 NOTE — Progress Notes (Signed)
Refs put in for endo and cardio

## 2022-09-30 ENCOUNTER — Ambulatory Visit
Admission: RE | Admit: 2022-09-30 | Discharge: 2022-09-30 | Disposition: A | Discharge status: Home / Self Care | Payer: BC Managed Care – PPO | Source: Ambulatory Visit | Attending: Family Medicine | Admitting: Family Medicine

## 2022-09-30 DIAGNOSIS — Z8571 Personal history of Hodgkin lymphoma: Secondary | ICD-10-CM | POA: Insufficient documentation

## 2022-09-30 DIAGNOSIS — C8190 Hodgkin lymphoma, unspecified, unspecified site: Secondary | ICD-10-CM | POA: Diagnosis not present

## 2022-09-30 DIAGNOSIS — R221 Localized swelling, mass and lump, neck: Secondary | ICD-10-CM | POA: Diagnosis not present

## 2022-09-30 DIAGNOSIS — R0789 Other chest pain: Secondary | ICD-10-CM | POA: Diagnosis not present

## 2022-11-04 ENCOUNTER — Ambulatory Visit (INDEPENDENT_AMBULATORY_CARE_PROVIDER_SITE_OTHER): Payer: BC Managed Care – PPO | Admitting: Family Medicine

## 2022-11-04 ENCOUNTER — Encounter: Payer: Self-pay | Admitting: Family Medicine

## 2022-11-04 VITALS — BP 110/68 | HR 102 | Ht 66.0 in | Wt 235.0 lb

## 2022-11-04 DIAGNOSIS — R0789 Other chest pain: Secondary | ICD-10-CM

## 2022-11-04 NOTE — Progress Notes (Signed)
Date:  11/04/2022   Name:  Jo [name]   DOB:  04-05-1980   MRN:  [id number]   Chief Complaint: Follow-up (SOB- better- seeing cardio 10/22)  Chest Pain  This is a recurrent problem. The current episode started more than 1 month ago. The problem occurs daily. The problem has been unchanged. Pain location: upper chest. The quality of the pain is described as heavy and pressure. The pain radiates to the left neck and right neck. Associated symptoms include exertional chest pressure, palpitations and shortness of breath. Pertinent negatives include no abdominal pain, cough, diaphoresis, dizziness, nausea or orthopnea. The pain is aggravated by nothing. She has tried nothing for the symptoms.  Pertinent negatives for past medical history include no CAD. Past medical history comments: hodgkin  Her family medical history is significant for heart disease, early MI and sudden death.  Pertinent negatives for family medical history include: no aortic dissection.    Lab Results  Component Value Date   NA 135 09/20/2022   K 3.6 09/20/2022   CO2 26 09/20/2022   GLUCOSE 91 09/20/2022   BUN 12 09/20/2022   CREATININE 0.68 09/20/2022   CALCIUM 7.2 (L) 09/20/2022   EGFR 113 05/03/2022   GFRNONAA >60 09/20/2022   Lab Results  Component Value Date   CHOL 179 09/20/2022   HDL 65 09/20/2022   LDLCALC 85 09/20/2022   TRIG 144 09/20/2022   CHOLHDL 2.8 09/20/2022   Lab Results  Component Value Date   TSH 0.941 09/20/2022   Lab Results  Component Value Date   HGBA1C 5.9 (H) 05/03/2022   Lab Results  Component Value Date   WBC 13.3 (H) 09/20/2022   HGB 10.9 (L) 09/20/2022   HCT 34.9 (L) 09/20/2022   MCV 70.2 (L) 09/20/2022   PLT 395 09/20/2022   Lab Results  Component Value Date   ALT 10 05/03/2022   AST 14 05/03/2022   ALKPHOS 61 05/03/2022   BILITOT <0.2 05/03/2022   Lab Results  Component Value Date   VD25OH 48.4 07/26/2016     Review of Systems  Constitutional:   Negative for diaphoresis.  Respiratory:  Positive for chest tightness and shortness of breath. Negative for cough, wheezing and stridor.   Cardiovascular:  Positive for chest pain and palpitations. Negative for orthopnea.  Gastrointestinal:  Negative for abdominal pain and nausea.  Neurological:  Negative for dizziness.    Patient Active Problem List   Diagnosis Date Noted   Anxiety 08/31/2019   Multinodular goiter 08/31/2019   Iron deficiency anemia 08/31/2019   Prediabetes 12/06/2016   Recurrent major depressive disorder, in partial remission (HCC) 12/05/2016   Tension type headache 04/27/2015   Hypocalcemia 04/17/2015   Hypoparathyroidism (HCC) 04/17/2015   GAD (generalized anxiety disorder) 10/18/2014   RLS (restless legs syndrome) 10/18/2014   Insomnia, persistent 10/18/2014   Migraine without aura and responsive to treatment 10/18/2014   CN (constipation) 10/18/2014   Major depression, recurrent, chronic (HCC) 10/18/2014   Dysmenorrhea 10/18/2014   Female infertility 10/18/2014   Gastro-esophageal reflux disease without esophagitis 10/18/2014   Allergy, food 10/18/2014   Hodgkin lymphoma, unspecified, unspecified site (HCC) 10/18/2014   H/O thyroidectomy 10/18/2014   Metabolic syndrome 10/18/2014   Morbid obesity, unspecified obesity type (HCC) 10/18/2014   Low parathyroid level after removal (HCC) 10/18/2014   Vitamin D deficiency 10/18/2014   Hive 10/18/2014   Dermatitis seborrheica 10/18/2014   Arthralgia of temporomandibular joint 10/18/2014   Post-surgical hypothyroidism 10/13/2012  Allergies  Allergen Reactions   Tape Rash    Uncoded Allergy. Allergen: Msg   Barium-Containing Compounds Hives    Oral contrast   Monosodium Glutamate     Unknown.   Other Rash and Other (See Comments)    Contrast dye Uncoded Allergy. Allergen: Msg Contrast dye  Pt states she has no Iodine or Omni IV contrast reaction. She is only allergic to Barium Oral contrast. ASW  09/08/19     Past Surgical History:  Procedure Laterality Date   lymphonodi biopsy     PORT-A-CATH REMOVAL  2004   PORTACATH PLACEMENT  11-24-12   THYROID SURGERY     THYROIDECTOMY  10-13-12   uterine poly removal Left 08/2010   [facility]    Social History   Tobacco Use   Smoking status: Former    Current packs/day: 0.00    Types: Cigarettes    Start date: 07/27/2009    Quit date: 07/27/2009    Years since quitting: 13.2   Smokeless tobacco: Never  Vaping Use   Vaping status: Never Used  Substance Use Topics   Alcohol use: Yes    Alcohol/week: 0.0 standard drinks of alcohol    Comment: rarely   Drug use: No     Medication list has been reviewed and updated.  Current Meds  Medication Sig   Ascorbic Acid (VITAMIN C PO) Take by mouth.   calcitRIOL (ROCALTROL) 0.5 MCG capsule Take 2 capsules (1 mcg total) by mouth 2 (two) times daily.   Calcium Citrate 333 MG TABS    EPINEPHrine 0.3 mg/0.3 mL IJ SOAJ injection Inject 0.3 mg into the muscle as needed for anaphylaxis.   fexofenadine (ALLEGRA) 180 MG tablet Take by mouth.   hydrocortisone cream 0.5 %    Iron, Ferrous Sulfate, 325 (65 Fe) MG TABS Take 325 mg by mouth daily.   levothyroxine (SYNTHROID) 200 MCG tablet TAKE 1 TABLET BY MOUTH DAILY BEFORE BREAKFAST.   levothyroxine (SYNTHROID) 25 MCG tablet Take 1 tablet (25 mcg total) by mouth daily.   Multiple Vitamin (MULTIVITAMIN ADULT PO) Take by mouth.   omeprazole (PRILOSEC OTC) 20 MG tablet Take by mouth.       11/04/2022    1:22 PM 09/20/2022    3:57 PM 05/10/2022   10:45 AM 11/02/2021    3:22 PM  GAD 7 : Generalized Anxiety Score  Nervous, Anxious, on Edge 0 0 0 0  Control/stop worrying 0 0 0 0  Worry too much - different things 0 0 0 0  Trouble relaxing 0 0 0 0  Restless 0 0 0 0  Easily annoyed or irritable 0 0 0 0  Afraid - awful might happen 0 0 0 0  Total GAD 7 Score 0 0 0 0  Anxiety Difficulty Not difficult at all Not difficult at all Not difficult at all Not  difficult at all       11/04/2022    1:22 PM 09/20/2022    3:56 PM 05/10/2022   10:45 AM  Depression screen PHQ 2/9  Decreased Interest 0 0 0  Down, Depressed, Hopeless 0 0 0  PHQ - 2 Score 0 0 0  Altered sleeping 0 0 0  Tired, decreased energy 0 0 0  Change in appetite 0 0 0  Feeling bad or failure about yourself  0 0 0  Trouble concentrating 0 0 0  Moving slowly or fidgety/restless 0 0 0  Suicidal thoughts 0 0 0  PHQ-9 Score 0 0 0  Difficult doing work/chores Not difficult at all Not difficult at all Not difficult at all    BP Readings from Last 3 Encounters:  11/04/22 110/68  09/20/22 130/76  05/10/22 118/78    Physical Exam Vitals and nursing note reviewed.  HENT:     Head: Normocephalic.     Right Ear: Tympanic membrane and ear canal normal. There is no impacted cerumen.     Left Ear: Tympanic membrane and ear canal normal. There is no impacted cerumen.     Nose: Nose normal. No congestion or rhinorrhea.     Mouth/Throat:     Mouth: Mucous membranes are moist.     Pharynx: No oropharyngeal exudate or posterior oropharyngeal erythema.  Eyes:     Pupils: Pupils are equal, round, and reactive to light.  Cardiovascular:     Rate and Rhythm: Normal rate.     Pulses: Normal pulses.     Heart sounds: Normal heart sounds. No murmur heard.    No friction rub. No gallop.  Pulmonary:     Effort: No respiratory distress.     Breath sounds: No stridor. No wheezing, rhonchi or rales.  Chest:     Chest wall: No tenderness.  Musculoskeletal:     Cervical back: Normal range of motion.  Neurological:     Mental Status: She is alert.     Wt Readings from Last 3 Encounters:  11/04/22 235 lb (106.6 kg)  09/20/22 234 lb (106.1 kg)  05/10/22 238 lb (108 kg)    BP 110/68   Pulse (!) 102   Ht 5\' 6"  (1.676 m)   Wt 235 lb (106.6 kg)   LMP 10/04/2022 (Approximate)   SpO2 96%   BMI 37.93 kg/m   Assessment and Plan: 1. Atypical chest pain New onset.  Episodic.  Stable.   Almost daily with duration of minutes of pressure/heaviness in the upper chest extending into both left and right neck area not involving the jaw.  May be associated with shortness of breath and patient has had episodes of palpitations.  This is atypical in nature and that at this point in time I would like to bring it to the attention of cardiology to see if this is an atypical presentation manage female of cardiac concern.  Patient has upcoming appointment and has been encouraged to go to the hospital if pain develops and does not resolve.    Elizabeth Sauer, MD

## 2022-11-27 DIAGNOSIS — E890 Postprocedural hypothyroidism: Secondary | ICD-10-CM | POA: Diagnosis not present

## 2022-12-03 ENCOUNTER — Encounter: Payer: Self-pay | Admitting: Cardiology

## 2022-12-03 ENCOUNTER — Ambulatory Visit: Payer: BC Managed Care – PPO | Attending: Cardiology | Admitting: Cardiology

## 2022-12-03 VITALS — BP 128/78 | HR 99 | Ht 66.0 in | Wt 231.2 lb

## 2022-12-03 DIAGNOSIS — R0602 Shortness of breath: Secondary | ICD-10-CM

## 2022-12-03 DIAGNOSIS — Z6837 Body mass index (BMI) 37.0-37.9, adult: Secondary | ICD-10-CM

## 2022-12-03 DIAGNOSIS — R072 Precordial pain: Secondary | ICD-10-CM | POA: Diagnosis not present

## 2022-12-03 MED ORDER — METOPROLOL TARTRATE 100 MG PO TABS: 100.0000 mg | ORAL_TABLET | Freq: Once | ORAL | 0 refills | Status: DC

## 2022-12-03 MED ORDER — IVABRADINE HCL 5 MG PO TABS: 15.0000 mg | ORAL_TABLET | Freq: Once | ORAL | 0 refills | Status: AC

## 2022-12-03 NOTE — Progress Notes (Signed)
Cardiology Office Note:    Date:  12/03/2022   ID:  Jo [name], DOB 01-12-1981, MRN [id number]  PCP:  Jo Limerick, MD   [facility] Providers Cardiologist:  Debbe Odea, MD     Referring MD: Jo Limerick, MD   Chief Complaint  Patient presents with   New Patient (Initial Visit)    Referred for cardiac evaluation of Atypical chest pain and shortness of breath with no cardiac history.      Jo [name] is a 42 y.o. female who is being seen today for the evaluation of chest pain at the request of Jo Limerick, MD.   History of Present Illness:    Jo [name] is a 42 y.o. female with a hx of GERD, enlarged thyroid s/p thyroidectomy 2014 with resulting hypothyroidism presenting with chest pain and shortness of breath.  States having symptoms of neck and upper chest pressure for about 2 months.  Also endorses shortness of breath around the same time with no exertion.  States not having any symptoms over the past 6 weeks.  Father had a heart attack in his 50s, brother also had a heart attack in his 81s.  She endorses snoring, daytime fatigue, somnolence.  Past Medical History:  Diagnosis Date   Anxiety    Constipation    Cramp of limb    Depression    GERD (gastroesophageal reflux disease)    Hives    Hodgkin disease (HCC) 2001 and 2014   chemo and rad tx in 2001 and 2014   Hodgkin's disease in remission    Hyperlipidemia    Insomnia    Lymphoma, Hodgkin's (HCC)    2001, 2014   Migraine    Obesity    Oligomenorrhea    Personal history of chemotherapy    Personal history of radiation therapy    Primary female infertility    Screening mammogram for high-risk patient    Seborrhea    Snoring    Thyroid disease    TMJ (dislocation of temporomandibular joint)    Vitamin D deficiency     Past Surgical History:  Procedure Laterality Date   lymphonodi biopsy     PORT-A-CATH REMOVAL  2004   PORTACATH PLACEMENT  11-24-12   THYROID  SURGERY     THYROIDECTOMY  10-13-12   uterine poly removal Left 08/2010   [facility]    Current Medications: Current Meds  Medication Sig   Ascorbic Acid (VITAMIN C PO) Take by mouth.   calcitRIOL (ROCALTROL) 0.5 MCG capsule Take 2 capsules (1 mcg total) by mouth 2 (two) times daily.   Calcium Citrate 333 MG TABS    EPINEPHrine 0.3 mg/0.3 mL IJ SOAJ injection Inject 0.3 mg into the muscle as needed for anaphylaxis.   fexofenadine (ALLEGRA) 180 MG tablet Take by mouth.   hydrocortisone cream 0.5 %    Iron, Ferrous Sulfate, 325 (65 Fe) MG TABS Take 325 mg by mouth daily.   ivabradine (CORLANOR) 5 MG TABS tablet Take 3 tablets (15 mg total) by mouth once for 1 dose. CASH PAY   levothyroxine (SYNTHROID) 200 MCG tablet TAKE 1 TABLET BY MOUTH DAILY BEFORE BREAKFAST.   levothyroxine (SYNTHROID) 25 MCG tablet Take 1 tablet (25 mcg total) by mouth daily.   metoprolol tartrate (LOPRESSOR) 100 MG tablet Take 1 tablet (100 mg total) by mouth once for 1 dose. TWO HOURS PRIOR TO CARDIAC CTA   Multiple Vitamin (MULTIVITAMIN ADULT PO) Take by mouth.  omeprazole (PRILOSEC OTC) 20 MG tablet Take by mouth.     Allergies:   Tape, Barium-containing compounds, Monosodium glutamate, and Other   Social History   Socioeconomic History   Marital status: Married    Spouse name: Not on file   Number of children: Not on file   Years of education: Not on file   Highest education level: Some college, no degree  Occupational History   Occupation: billing     Comment: [employer] - [place]  Tobacco Use   Smoking status: Former    Current packs/day: 0.00    Types: Cigarettes    Start date: 07/27/2009    Quit date: 07/27/2009    Years since quitting: 13.3   Smokeless tobacco: Never  Vaping Use   Vaping status: Never Used  Substance and Sexual Activity   Alcohol use: Yes    Alcohol/week: 0.0 standard drinks of alcohol    Comment: rarely   Drug use: No   Sexual activity: Yes    Birth  control/protection: None  Other Topics Concern   Not on file  Social History Narrative   Married, has fertility problems   Social Determinants of Health   Financial Resource Strain: Low Risk  (11/27/2022)   Received from [facility]   Overall Financial Resource Strain (CARDIA)    Difficulty of Paying Living Expenses: Not hard at all  Food Insecurity: No Food Insecurity (11/27/2022)   Received from [facility]   Hunger Vital Sign    Worried About Running Out of Food in the Last Year: Never true    Ran Out of Food in the Last Year: Never true  Transportation Needs: No Transportation Needs (11/27/2022)   Received from [facility] - Transportation    In the past 12 months, has lack of transportation kept you from medical appointments or from getting medications?: No    Lack of Transportation (Non-Medical): No  Physical Activity: Unknown (05/03/2022)   Exercise Vital Sign    Days of Exercise per Week: 0 days    Minutes of Exercise per Session: Not on file  Stress: No Stress Concern Present (05/03/2022)   [employer] of Occupational Health - Occupational Stress Questionnaire    Feeling of Stress : Not at all  Social Connections: Socially Integrated (05/03/2022)   Social Connection and Isolation Panel [NHANES]    Frequency of Communication with Friends and Family: More than three times a week    Frequency of Social Gatherings with Friends and Family: Once a week    Attends Religious Services: More than 4 times per year    Active Member of [employer] or Organizations: Yes    Attends Engineer, structural: More than 4 times per year    Marital Status: Married     Family History: The patient's family history includes Asthma in her cousin; Breast cancer in her cousin, maternal aunt, and paternal aunt; Breast cancer (age of onset: 47) in her mother; CAD in her father, maternal grandmother, and paternal grandmother;  Cancer in her mother; Coronary artery disease in her father, maternal grandfather, maternal grandmother, paternal grandfather, and paternal grandmother; Diabetes in her brother, father, paternal grandfather, and paternal grandmother; Heart attack in her maternal grandfather, maternal grandmother, paternal grandfather, and paternal grandmother; Heart disease in her brother and father; Hypertension in her mother; Liver disease in her mother; Parkinson's disease in her maternal grandfather and maternal grandmother; Sudden Cardiac Death in her father.  ROS:   Please see the history of present illness.     All other systems reviewed and are negative.  EKGs/Labs/Other Studies Reviewed:    The following studies were reviewed today:  EKG Interpretation Date/Time:  Tuesday December 03 2022 08:36:48 EDT Ventricular Rate:  99 PR Interval:  170 QRS Duration:  96 QT Interval:  382 QTC Calculation: 490 R Axis:   -9  Text Interpretation: Normal sinus rhythm Moderate voltage criteria for LVH, may be normal variant ( R in aVL , Cornell product ) Prolonged QT Confirmed by Debbe Odea ([id number]) on 12/03/2022 8:46:07 AM    Recent Labs: 05/03/2022: ALT 10 09/20/2022: BUN 12; Creatinine, Ser 0.68; Hemoglobin 10.9; Platelets 395; Potassium 3.6; Sodium 135; TSH 0.941  Recent Lipid Panel    Component Value Date/Time   CHOL 179 09/20/2022 1658   CHOL 198 03/02/2021 1010   TRIG 144 09/20/2022 1658   HDL 65 09/20/2022 1658   HDL 71 03/02/2021 1010   CHOLHDL 2.8 09/20/2022 1658   VLDL 29 09/20/2022 1658   LDLCALC 85 09/20/2022 1658   LDLCALC 104 (H) 03/02/2021 1010     Risk Assessment/Calculations:             Physical Exam:    VS:  BP 128/78 (BP Location: Right Arm, Patient Position: Sitting, Cuff Size: Large)   Pulse 99   Ht 5\' 6"  (1.676 m)   Wt 231 lb 3.2 oz (104.9 kg)   LMP 10/04/2022 (Approximate)   SpO2 98%   BMI 37.32 kg/m     Wt Readings from Last 3 Encounters:  12/03/22 231 lb  3.2 oz (104.9 kg)  11/04/22 235 lb (106.6 kg)  09/20/22 234 lb (106.1 kg)     GEN:  Well nourished, well developed in no acute distress HEENT: Normal NECK: No JVD; No carotid bruits CARDIAC: RRR, no murmurs, rubs, gallops RESPIRATORY:  Clear to auscultation without rales, wheezing or rhonchi  ABDOMEN: Soft, non-tender, non-distended MUSCULOSKELETAL:  No edema; No deformity  SKIN: Warm and dry NEUROLOGIC:  Alert and oriented x 3 PSYCHIATRIC:  Normal affect   ASSESSMENT:    1. Precordial pain   2. Shortness of breath   3. BMI 37.0-37.9, adult    PLAN:    In order of problems listed above:  Chest pain/chest pressure, family history of early CAD.  Obtain echo, obtain coronary CTA.  If insurance/coronary CT is cost prohibitive, will consider Lexiscan Myoview. Shortness of breath, cardiac workup with echo and CT as above.  Patient endorsed snoring, daytime fatigue.  Consider OSA eval at follow-up visit if cardiac workup is on revealing. Obesity, low-calorie diet, weight loss, increased activity advised.  Follow-up after cardiac testing.     Medication Adjustments/Labs and Tests Ordered: Current medicines are reviewed at length with the patient today.  Concerns regarding medicines are outlined above.  Orders Placed This Encounter  Procedures   CT CORONARY MORPH W/CTA COR W/SCORE W/CA W/CM &/OR WO/CM   Basic Metabolic Panel (BMET)   EKG 12-Lead   ECHOCARDIOGRAM COMPLETE   Meds ordered this encounter  Medications   metoprolol tartrate (LOPRESSOR) 100 MG tablet    Sig: Take 1 tablet (100 mg total) by mouth once for 1 dose. TWO HOURS PRIOR TO CARDIAC CTA    Dispense:  1 tablet    Refill:  0   ivabradine (CORLANOR) 5 MG TABS tablet    Sig: Take 3 tablets (15 mg total) by mouth once for 1 dose. CASH PAY  Dispense:  3 tablet    Refill:  0    Patient Instructions  Medication Instructions:   Your physician recommends that you continue on your current medications as  directed. Please refer to the Current Medication list given to you today.   *If you need a refill on your cardiac medications before your next appointment, please call your pharmacy*   Lab Work:  Your physician recommends you have labs - BMP   If you have labs (blood work) drawn today and your tests are completely normal, you will receive your results only by: MyChart Message (if you have MyChart) OR A paper copy in the mail If you have any lab test that is abnormal or we need to change your treatment, we will call you to review the results.   Testing/Procedures:  Your physician has requested that you have an echocardiogram. Echocardiography is a painless test that uses sound waves to create images of your heart. It provides your doctor with information about the size and shape of your heart and how well your heart's chambers and valves are working. This procedure takes approximately one hour. There are no restrictions for this procedure. Please do NOT wear cologne, perfume, aftershave, or lotions (deodorant is allowed). Please arrive 15 minutes prior to your appointment time.     Your cardiac CT will be scheduled at one of the below locations:   [facility] [address] [place], [place] [address] [phone]  OR   [facility] [address] [place], [place] [address] [phone]   If scheduled at [facility] or [facility], please arrive 15 mins early for check-in and test prep.  There is spacious parking and easy access to the radiology department from the [facility] entrance. Please enter here and check-in with the desk attendant.   Please follow these instructions carefully (unless otherwise directed):  An IV will be required for this test and Nitroglycerin will be given.  Hold all erectile dysfunction medications at least 3 days (72  hrs) prior to test. (Ie viagra, cialis, sildenafil, tadalafil, etc)   On the Night Before the Test: Be sure to Drink plenty of water. Do not consume any caffeinated/decaffeinated beverages or chocolate 12 hours prior to your test. Do not take any antihistamines 12 hours prior to your test.  On the Day of the Test: Drink plenty of water until 1 hour prior to the test. Do not eat any food 1 hour prior to test. You may take your regular medications prior to the test.  Take metoprolol (Lopressor) two hours prior to test. Take Corlanor TWO hours prior to test If you take Furosemide/Hydrochlorothiazide/Spironolactone, please HOLD on the morning of the test. FEMALES- please wear underwire-free bra if available, avoid dresses & tight clothing        After the Test: Drink plenty of water. After receiving IV contrast, you may experience a mild flushed feeling. This is normal. On occasion, you may experience a mild rash up to 24 hours after the test. This is not dangerous. If this occurs, you can take Benadryl 25 mg and increase your fluid intake. If you experience trouble breathing, this can be serious. If it is severe call 911 IMMEDIATELY. If it is mild, please call our office. If you take any of these medications: Glipizide/Metformin, Avandament, Glucavance, please do not take 48 hours after completing test unless otherwise instructed.  We will call to  schedule your test 2-4 weeks out understanding that some insurance companies will need an authorization prior to the service being performed.   For more information and frequently asked questions, please visit our website : [email or web address]  For non-scheduling related questions, please contact the cardiac imaging nurse navigator should you have any questions/concerns: Cardiac Imaging Nurse Navigators Direct Office Dial: [phone]   For scheduling needs, including cancellations and rescheduling, please call [place],  [phone].   Follow-Up: At [facility], you and your health needs are our priority.  As part of our continuing mission to provide you with exceptional heart care, we have created designated Provider Care Teams.  These Care Teams include your primary Cardiologist (physician) and Advanced Practice Providers (APPs -  Physician Assistants and Nurse Practitioners) who all work together to provide you with the care you need, when you need it.  We recommend signing up for the patient portal called "MyChart".  Sign up information is provided on this After Visit Summary.  MyChart is used to connect with patients for Virtual Visits (Telemedicine).  Patients are able to view lab/test results, encounter notes, upcoming appointments, etc.  Non-urgent messages can be sent to your provider as well.   To learn more about what you can do with MyChart, go to [email or web address].    Your next appointment:    After Cardiac testing  Provider:   You may see Debbe Odea, MD or one of the following Advanced Practice Providers on your designated Care Team:   Nicolasa Ducking, NP Eula Listen, PA-C Cadence Fransico Michael, PA-C Charlsie Quest, NP     Lexi scan - Myoview    Signed, Debbe Odea, MD  12/03/2022 9:41 AM    [facility]

## 2022-12-03 NOTE — Patient Instructions (Addendum)
Medication Instructions:   Your physician recommends that you continue on your current medications as directed. Please refer to the Current Medication list given to you today.   *If you need a refill on your cardiac medications before your next appointment, please call your pharmacy*   Lab Work:  Your physician recommends you have labs - BMP   If you have labs (blood work) drawn today and your tests are completely normal, you will receive your results only by: MyChart Message (if you have MyChart) OR A paper copy in the mail If you have any lab test that is abnormal or we need to change your treatment, we will call you to review the results.   Testing/Procedures:  Your physician has requested that you have an echocardiogram. Echocardiography is a painless test that uses sound waves to create images of your heart. It provides your doctor with information about the size and shape of your heart and how well your heart's chambers and valves are working. This procedure takes approximately one hour. There are no restrictions for this procedure. Please do NOT wear cologne, perfume, aftershave, or lotions (deodorant is allowed). Please arrive 15 minutes prior to your appointment time.     Your cardiac CT will be scheduled at one of the below locations:   [facility] [address] [place], [place] [address] [phone]  OR   [facility] [address] [place], [place] [address] [phone]   If scheduled at [facility] or [facility], please arrive 15 mins early for check-in and test prep.  There is spacious parking and easy access to the radiology department from the [facility] V[facility] entrance. Please enter here and check-in with the desk attendant.   Please follow these instructions carefully (unless otherwise directed):  An IV will be required for  this test and Nitroglycerin will be given.  Hold all erectile dysfunction medications at least 3 days (72 hrs) prior to test. (Ie viagra, cialis, sildenafil, tadalafil, etc)   On the Night Before the Test: Be sure to Drink plenty of water. Do not consume any caffeinated/decaffeinated beverages or chocolate 12 hours prior to your test. Do not take any antihistamines 12 hours prior to your test.  On the Day of the Test: Drink plenty of water until 1 hour prior to the test. Do not eat any food 1 hour prior to test. You may take your regular medications prior to the test.  Take metoprolol (Lopressor) two hours prior to test. Take Corlanor TWO hours prior to test If you take Furosemide/Hydrochlorothiazide/Spironolactone, please HOLD on the morning of the test. FEMALES- please wear underwire-free bra if available, avoid dresses & tight clothing        After the Test: Drink plenty of water. After receiving IV contrast, you may experience a mild flushed feeling. This is normal. On occasion, you may experience a mild rash up to 24 hours after the test. This is not dangerous. If this occurs, you can take Benadryl 25 mg and increase your fluid intake. If you experience trouble breathing, this can be serious. If it is severe call 911 IMMEDIATELY. If it is mild, please call our office. If you take any of these medications: Glipizide/Metformin, Avandament, Glucavance, please do not take 48 hours after completing test unless otherwise instructed.  We will call to schedule your test 2-4 weeks out understanding that some insurance companies will need an authorization prior to  the service being performed.   For more information and frequently asked questions, please visit our website : [email or web address]  For non-scheduling related questions, please contact the cardiac imaging nurse navigator should you have any questions/concerns: Cardiac Imaging Nurse Navigators Direct Office Dial:  [phone]   For scheduling needs, including cancellations and rescheduling, please call [place], [phone].   Follow-Up: At [facility], you and your health needs are our priority.  As part of our continuing mission to provide you with exceptional heart care, we have created designated Provider Care Teams.  These Care Teams include your primary Cardiologist (physician) and Advanced Practice Providers (APPs -  Physician Assistants and Nurse Practitioners) who all work together to provide you with the care you need, when you need it.  We recommend signing up for the patient portal called "MyChart".  Sign up information is provided on this After Visit Summary.  MyChart is used to connect with patients for Virtual Visits (Telemedicine).  Patients are able to view lab/test results, encounter notes, upcoming appointments, etc.  Non-urgent messages can be sent to your provider as well.   To learn more about what you can do with MyChart, go to [email or web address].    Your next appointment:    After Cardiac testing  Provider:   You may see Debbe Odea, MD or one of the following Advanced Practice Providers on your designated Care Team:   Nicolasa Ducking, NP Eula Listen, PA-C Cadence Fransico Michael, PA-C Charlsie Quest, NP     Lexi scan - Myoview

## 2022-12-09 ENCOUNTER — Telehealth: Payer: Self-pay | Admitting: Cardiology

## 2022-12-09 ENCOUNTER — Encounter: Payer: Self-pay | Admitting: Cardiology

## 2022-12-09 NOTE — Telephone Encounter (Signed)
Spoke with patient and reviewed CPT codes for Echo and Cardiac CTA so she can get the cost from insurance.   Patient was thankful for the call.Marland Kitchen

## 2022-12-09 NOTE — Telephone Encounter (Signed)
[employer] needs CPT Codes for Echo

## 2022-12-09 NOTE — Telephone Encounter (Signed)
Calling to get cpt code for CT and EKG. They ask that we call the patient back with the information. Please advise

## 2022-12-09 NOTE — Telephone Encounter (Signed)
Sent [employer].  Precert team also aware will need authorization.   Previous message sent to them as well. Thanks!

## 2022-12-10 DIAGNOSIS — R072 Precordial pain: Secondary | ICD-10-CM | POA: Diagnosis not present

## 2022-12-11 LAB — BASIC METABOLIC PANEL
BUN/Creatinine Ratio: 19 (ref 9–23)
BUN: 14 mg/dL (ref 6–24)
CO2: 26 mmol/L (ref 20–29)
Calcium: 8.5 mg/dL — ABNORMAL LOW (ref 8.7–10.2)
Chloride: 97 mmol/L (ref 96–106)
Creatinine, Ser: 0.73 mg/dL (ref 0.57–1.00)
Glucose: 94 mg/dL (ref 70–99)
Potassium: 4.3 mmol/L (ref 3.5–5.2)
Sodium: 139 mmol/L (ref 134–144)
eGFR: 107 mL/min/{1.73_m2} (ref >59)

## 2022-12-13 ENCOUNTER — Other Ambulatory Visit: Payer: Self-pay | Admitting: Family Medicine

## 2022-12-13 ENCOUNTER — Encounter: Payer: Self-pay | Admitting: Hematology and Oncology

## 2022-12-13 DIAGNOSIS — E890 Postprocedural hypothyroidism: Secondary | ICD-10-CM

## 2022-12-13 NOTE — Telephone Encounter (Signed)
Requested by interface surscripts. Protocol met.  Requested Prescriptions  Pending Prescriptions Disp Refills   levothyroxine (SYNTHROID) 25 MCG tablet [Pharmacy Med Name: LEVOTHYROXIN TAB 25MCG] 90 tablet 1    Sig: TAKE 1 TABLET BY MOUTH DAILY.     Endocrinology:  Hypothyroid Agents Passed - 12/13/2022  1:39 PM      Passed - TSH in normal range and within 360 days    TSH  Date Value Ref Range Status  09/20/2022 0.941 0.350 - 4.500 uIU/mL Final    Comment:    Performed by a 3rd Generation assay with a functional sensitivity of <=0.01 uIU/mL. Performed at [facility], [address] [place], [place] [address]   05/03/2022 0.648 0.450 - 4.500 uIU/mL Final         Passed - Valid encounter within last 12 months    Recent Outpatient Visits           1 month ago Atypical chest pain   [place] [facility], [facility]   2 months ago Atypical chest pain   [place] [facility]   7 months ago Hypoparathyroidism after surgical removal of thyroid gland [facility])   [place] [facility], [facility]   7 months ago Iron deficiency anemia, unspecified iron deficiency anemia type   [place] [facility], [facility]   1 year ago Iron deficiency anemia, unspecified iron deficiency anemia type   [facility]       Future Appointments             In 1 month Debbe Odea, MD [facility]             levothyroxine (SYNTHROID) 200 MCG tablet [Pharmacy Med Name: LEVOTHYROXIN TAB 200MCG] 90 tablet 1    Sig: TAKE 1 TABLET BY MOUTH DAILY BEFORE BREAKFAST.     Endocrinology:  Hypothyroid Agents Passed - 12/13/2022  1:39 PM      Passed - TSH in normal range and within 360 days    TSH   Date Value Ref Range Status  09/20/2022 0.941 0.350 - 4.500 uIU/mL Final    Comment:    Performed by a 3rd Generation assay with a functional sensitivity of <=0.01 uIU/mL. Performed at [facility], [address] [place], [place] [address]   05/03/2022 0.648 0.450 - 4.500 uIU/mL Final         Passed - Valid encounter within last 12 months    Recent Outpatient Visits           1 month ago Atypical chest pain   [place] [facility]   2 months ago Atypical chest pain   [place] [facility]   7 months ago Hypoparathyroidism after surgical removal of thyroid gland [facility])   [place] [facility], MD   7 months ago Iron deficiency anemia, unspecified iron deficiency anemia type   [facility], MD   1 year ago Iron deficiency anemia, unspecified iron deficiency anemia type  [facility]       Future Appointments             In 1 month Debbe Odea, MD [facility]

## 2022-12-17 ENCOUNTER — Telehealth (HOSPITAL_COMMUNITY): Payer: Self-pay | Admitting: *Deleted

## 2022-12-17 ENCOUNTER — Ambulatory Visit: Payer: BC Managed Care – PPO | Attending: Cardiology

## 2022-12-17 DIAGNOSIS — R072 Precordial pain: Secondary | ICD-10-CM

## 2022-12-17 LAB — ECHOCARDIOGRAM COMPLETE
Area-P 1/2: 4.89 cm2
P 1/2 time: 441 ms
S' Lateral: 2 cm

## 2022-12-17 NOTE — Telephone Encounter (Signed)
Reaching out to patient to offer assistance regarding upcoming cardiac imaging study; pt verbalizes understanding of appt date/time, parking situation and where to check in, pre-test NPO status and medications ordered, and verified current allergies; name and call back number provided for further questions should they arise Johney Frame RN Navigator Cardiac Imaging [facility] [phone] office [phone] cell   Patient denies IV contrast allergy.  Allergy listed is only to barium oral contrast.  Had CT scan in 2021 with IV contrast, no issues.

## 2022-12-18 ENCOUNTER — Ambulatory Visit
Admission: RE | Admit: 2022-12-18 | Discharge: 2022-12-18 | Disposition: A | Discharge status: Home / Self Care | Payer: BC Managed Care – PPO | Source: Ambulatory Visit | Attending: Cardiology | Admitting: Cardiology

## 2022-12-18 DIAGNOSIS — R072 Precordial pain: Secondary | ICD-10-CM | POA: Diagnosis not present

## 2022-12-18 MED ORDER — NITROGLYCERIN 0.4 MG SL SUBL
0.8000 mg | SUBLINGUAL_TABLET | Freq: Once | SUBLINGUAL | Status: AC
  Administered 2022-12-18: 0.8 mg via SUBLINGUAL
  Filled 2022-12-18: qty 25

## 2022-12-18 MED ORDER — IOHEXOL 350 MG/ML SOLN
80.0000 mL | Freq: Once | INTRAVENOUS | Status: AC | PRN
  Administered 2022-12-18: 80 mL via INTRAVENOUS

## 2022-12-18 MED ORDER — METOPROLOL TARTRATE 5 MG/5ML IV SOLN
10.0000 mg | Freq: Once | INTRAVENOUS | Status: DC
  Filled 2022-12-18: qty 10

## 2022-12-18 NOTE — Progress Notes (Signed)
Pt tolerated procedure well with no issues. Pt ABCs intact. Pt denies any complaints. Pt encouraged to drink plenty of water throughout the day. Pt ambulatory with steady gait.

## 2022-12-23 ENCOUNTER — Other Ambulatory Visit: Payer: Self-pay

## 2022-12-23 MED ORDER — ASPIRIN 81 MG PO TBEC: 81.0000 mg | DELAYED_RELEASE_TABLET | Freq: Every day | ORAL | Status: AC

## 2022-12-23 MED ORDER — ATORVASTATIN CALCIUM 20 MG PO TABS: 20.0000 mg | ORAL_TABLET | Freq: Every day | ORAL | 3 refills | Status: AC

## 2023-01-29 ENCOUNTER — Encounter: Payer: Self-pay | Admitting: Cardiology

## 2023-01-29 ENCOUNTER — Ambulatory Visit: Payer: BC Managed Care – PPO | Attending: Cardiology | Admitting: Cardiology

## 2023-01-29 VITALS — BP 112/72 | HR 96 | Ht 66.0 in | Wt 223.4 lb

## 2023-01-29 DIAGNOSIS — Z79899 Other long term (current) drug therapy: Secondary | ICD-10-CM

## 2023-01-29 DIAGNOSIS — I2510 Atherosclerotic heart disease of native coronary artery without angina pectoris: Secondary | ICD-10-CM | POA: Diagnosis not present

## 2023-01-29 DIAGNOSIS — Z6836 Body mass index (BMI) 36.0-36.9, adult: Secondary | ICD-10-CM

## 2023-01-29 DIAGNOSIS — E785 Hyperlipidemia, unspecified: Secondary | ICD-10-CM

## 2023-01-29 NOTE — Progress Notes (Signed)
Cardiology Office Note:    Date:  01/29/2023   ID:  Jo [name], DOB November 03, 1980, MRN [id number]  PCP:  Duanne Limerick, MD   [facility] Providers Cardiologist:  Debbe Odea, MD     Referring MD: Duanne Limerick, MD   Chief Complaint  Patient presents with   Follow-up    Patient denies new or acute cardiac problems/concerns today.     Jo [name] is a 42 y.o. female who is being seen today for the evaluation of chest pain at the request of Yetta Barre, Deanna C, MD.   History of Present Illness:    Jo [name] is a 42 y.o. female with a hx of GERD, enlarged thyroid s/p thyroidectomy 2014 with resulting hypothyroidism presenting for follow-up.  Patient was last seen with symptoms of chest pain and shortness of breath.  Has a family history of early CAD.  Echo and coronary CT was obtained to evaluate cardiac etiology.  She has been eating healthier, trying to be more active.  Symptoms overall have improved.  Prior notes/testing  father had a heart attack in his 67s, brother also had a heart attack in his 75s.   Past Medical History:  Diagnosis Date   Anxiety    Constipation    Cramp of limb    Depression    GERD (gastroesophageal reflux disease)    Hives    Hodgkin disease (HCC) 2001 and 2014   chemo and rad tx in 2001 and 2014   Hodgkin's disease in remission    Hyperlipidemia    Insomnia    Lymphoma, Hodgkin's (HCC)    2001, 2014   Migraine    Obesity    Oligomenorrhea    Personal history of chemotherapy    Personal history of radiation therapy    Primary female infertility    Screening mammogram for high-risk patient    Seborrhea    Snoring    Thyroid disease    TMJ (dislocation of temporomandibular joint)    Vitamin D deficiency     Past Surgical History:  Procedure Laterality Date   lymphonodi biopsy     PORT-A-CATH REMOVAL  2004   PORTACATH PLACEMENT  11-24-12   THYROID SURGERY     THYROIDECTOMY  10-13-12   uterine poly removal  Left 08/2010   [facility]    Current Medications: Current Meds  Medication Sig   Ascorbic Acid (VITAMIN C PO) Take by mouth.   aspirin EC 81 MG tablet Take 1 tablet (81 mg total) by mouth daily. Swallow whole.   atorvastatin (LIPITOR) 20 MG tablet Take 1 tablet (20 mg total) by mouth daily.   calcitRIOL (ROCALTROL) 0.5 MCG capsule Take 2 capsules (1 mcg total) by mouth 2 (two) times daily.   Calcium Citrate 333 MG TABS    EPINEPHrine 0.3 mg/0.3 mL IJ SOAJ injection Inject 0.3 mg into the muscle as needed for anaphylaxis.   fexofenadine (ALLEGRA) 180 MG tablet Take by mouth.   hydrocortisone cream 0.5 %    Iron, Ferrous Sulfate, 325 (65 Fe) MG TABS Take 325 mg by mouth daily.   levothyroxine (SYNTHROID) 200 MCG tablet TAKE 1 TABLET BY MOUTH DAILY BEFORE BREAKFAST.   levothyroxine (SYNTHROID) 25 MCG tablet TAKE 1 TABLET BY MOUTH DAILY.   Multiple Vitamin (MULTIVITAMIN ADULT PO) Take by mouth.   omeprazole (PRILOSEC OTC) 20 MG tablet Take by mouth.     Allergies:   Tape, Barium-containing compounds, Monosodium glutamate, and Other  Social History   Socioeconomic History   Marital status: Married    Spouse name: Not on file   Number of children: Not on file   Years of education: Not on file   Highest education level: Some college, no degree  Occupational History   Occupation: billing     Comment: [employer] [place]  Tobacco Use   Smoking status: Former    Current packs/day: 0.00    Types: Cigarettes    Start date: 07/27/2009    Quit date: 07/27/2009    Years since quitting: 13.5   Smokeless tobacco: Never  Vaping Use   Vaping status: Never Used  Substance and Sexual Activity   Alcohol use: Yes    Alcohol/week: 0.0 standard drinks of alcohol    Comment: rarely   Drug use: No   Sexual activity: Yes    Birth control/protection: None  Other Topics Concern   Not on file  Social History Narrative   Married, has fertility problems   Social Drivers of Manufacturing engineer Strain: Low Risk  (11/27/2022)   Received from [facility]   Overall Financial Resource Strain (CARDIA)    Difficulty of Paying Living Expenses: Not hard at all  Food Insecurity: No Food Insecurity (11/27/2022)   [facility]   Hunger Vital Sign    Worried About Running Out of Food in the Last Year: Never true    Ran Out of Food in the Last Year: Never true  Transportation Needs: No Transportation Needs (11/27/2022)   [facility] from [facility] - Transportation    In the past 12 months, has lack of transportation kept you from medical appointments or from getting medications?: No    Lack of Transportation (Non-Medical): No  Physical Activity: Unknown (05/03/2022)   Exercise Vital Sign    Days of Exercise per Week: 0 days    Minutes of Exercise per Session: Not on file  Stress: No Stress Concern Present (05/03/2022)   [employer] of Occupational Health - Occupational Stress Questionnaire    Feeling of Stress : Not at all  Social Connections: Socially Integrated (05/03/2022)   Social Connection and Isolation Panel [NHANES]    Frequency of Communication with Friends and Family: More than three times a week    Frequency of Social Gatherings with Friends and Family: Once a week    Attends Religious Services: More than 4 times per year    Active Member of [employer] or Organizations: Yes    Attends Engineer, structural: More than 4 times per year    Marital Status: Married     Family History: The patient's family history includes Asthma in her cousin; Breast cancer in her cousin, maternal aunt, and paternal aunt; Breast cancer (age of onset: 71) in her mother; CAD in her father, maternal grandmother, and paternal grandmother; Cancer in her mother; Coronary artery disease in her father, maternal grandfather, maternal grandmother, paternal grandfather, and paternal grandmother; Diabetes  in her brother, father, paternal grandfather, and paternal grandmother; Heart attack in her maternal grandfather, maternal grandmother, paternal grandfather, and paternal grandmother; Heart disease in her brother and father; Hypertension in her mother; Liver disease in her mother; Parkinson's disease in her maternal grandfather and maternal grandmother; Sudden Cardiac Death in her father.  ROS:   Please see the history of present illness.     All other systems reviewed and are negative.  EKGs/Labs/Other Studies  Reviewed:    The following studies were reviewed today:       Recent Labs: 05/03/2022: ALT 10 09/20/2022: Hemoglobin 10.9; Platelets 395; TSH 0.941 12/10/2022: BUN 14; Creatinine, Ser 0.73; Potassium 4.3; Sodium 139  Recent Lipid Panel    Component Value Date/Time   CHOL 179 09/20/2022 1658   CHOL 198 03/02/2021 1010   TRIG 144 09/20/2022 1658   HDL 65 09/20/2022 1658   HDL 71 03/02/2021 1010   CHOLHDL 2.8 09/20/2022 1658   VLDL 29 09/20/2022 1658   LDLCALC 85 09/20/2022 1658   LDLCALC 104 (H) 03/02/2021 1010     Risk Assessment/Calculations:             Physical Exam:    VS:  BP 112/72 (BP Location: Left Arm, Patient Position: Sitting, Cuff Size: Large)   Pulse 96   Ht 5\' 6"  (1.676 m)   Wt 223 lb 6.4 oz (101.3 kg)   SpO2 98%   BMI 36.06 kg/m     Wt Readings from Last 3 Encounters:  01/29/23 223 lb 6.4 oz (101.3 kg)  12/03/22 231 lb 3.2 oz (104.9 kg)  11/04/22 235 lb (106.6 kg)     GEN:  Well nourished, well developed in no acute distress HEENT: Normal NECK: No JVD; No carotid bruits CARDIAC: RRR, no murmurs, rubs, gallops RESPIRATORY:  Clear to auscultation without rales, wheezing or rhonchi  ABDOMEN: Soft, non-tender, non-distended MUSCULOSKELETAL:  No edema; No deformity  SKIN: Warm and dry NEUROLOGIC:  Alert and oriented x 3 PSYCHIATRIC:  Normal affect   ASSESSMENT:    1. Coronary artery disease involving native coronary artery of native heart,  unspecified whether angina present   2. BMI 36.0-36.9,adult   3. Hyperlipidemia LDL goal <70   4. Medication management     PLAN:    In order of problems listed above:  Nonobstructive CAD, mild LAD stenosis, minimal RCA or left circumflex left main, calcium score 225.  Echo with EF 60 to 65%.  Continue aspirin 81 mg daily, Lipitor 20 mg daily. Obesity, likely reason for shortness of breath.  Low-calorie diet, weight loss, increased activity advised.  Likely reason for shortness of breath. Goal LDL less than 70.  Lipitor 20 mg daily.  Recheck lipid panel in 6 months.  Follow-up yearly.     Medication Adjustments/Labs and Tests Ordered: Current medicines are reviewed at length with the patient today.  Concerns regarding medicines are outlined above.  Orders Placed This Encounter  Procedures   Lipid panel   No orders of the defined types were placed in this encounter.   Patient Instructions  Medication Instructions:   Your Physician recommend you continue on your current medication as directed.     *If you need a refill on your cardiac medications before your next appointment, please call your pharmacy*   Lab Work: Your provider would like for you to return in 6 month to have the following labs drawn: Lipid.   Please go to [facility] [address] ([facility] [address], [place] [address] You do not need an appointment.  They are open from 7:30 am-4 pm.  Lunch from 1:00 pm- 2:00 pm You will need to be fasting.    If you have labs (blood work) drawn today and your tests are completely normal, you will receive your results only by: MyChart Message (if you have MyChart) OR A paper copy in the mail If you have any lab test that is abnormal or we need  to change your treatment, we will call you to review the results.   Testing/Procedures: None ordered.   Follow-Up: At [facility], you and your health needs are our priority.  As part of  our continuing mission to provide you with exceptional heart care, we have created designated Provider Care Teams.  These Care Teams include your primary Cardiologist (physician) and Advanced Practice Providers (APPs -  Physician Assistants and Nurse Practitioners) who all work together to provide you with the care you need, when you need it.  We recommend signing up for the patient portal called "MyChart".  Sign up information is provided on this After Visit Summary.  MyChart is used to connect with patients for Virtual Visits (Telemedicine).  Patients are able to view lab/test results, encounter notes, upcoming appointments, etc.  Non-urgent messages can be sent to your provider as well.   To learn more about what you can do with MyChart, go to [email or web address].    Your next appointment:   12 month(s)  Provider:   You may see Debbe Odea, MD or one of the following Advanced Practice Providers on your designated Care Team:   Nicolasa Ducking, NP Eula Listen, PA-C Cadence Fransico Michael, PA-C Charlsie Quest, NP Carlos Levering, NP            Signed, Debbe Odea, MD  01/29/2023 9:33 AM    [facility]

## 2023-01-29 NOTE — Patient Instructions (Signed)
Medication Instructions:   Your Physician recommend you continue on your current medication as directed.     *If you need a refill on your cardiac medications before your next appointment, please call your pharmacy*   Lab Work: Your provider would like for you to return in 6 month to have the following labs drawn: Lipid.   Please go to [facility] [address] ([facility] [address], [place] [address] You do not need an appointment.  They are open from 7:30 am-4 pm.  Lunch from 1:00 pm- 2:00 pm You will need to be fasting.    If you have labs (blood work) drawn today and your tests are completely normal, you will receive your results only by: MyChart Message (if you have MyChart) OR A paper copy in the mail If you have any lab test that is abnormal or we need to change your treatment, we will call you to review the results.   Testing/Procedures: None ordered.   Follow-Up: At [facility], you and your health needs are our priority.  As part of our continuing mission to provide you with exceptional heart care, we have created designated Provider Care Teams.  These Care Teams include your primary Cardiologist (physician) and Advanced Practice Providers (APPs -  Physician Assistants and Nurse Practitioners) who all work together to provide you with the care you need, when you need it.  We recommend signing up for the patient portal called "MyChart".  Sign up information is provided on this After Visit Summary.  MyChart is used to connect with patients for Virtual Visits (Telemedicine).  Patients are able to view lab/test results, encounter notes, upcoming appointments, etc.  Non-urgent messages can be sent to your provider as well.   To learn more about what you can do with MyChart, go to [email or web address].    Your next appointment:   12 month(s)  Provider:   You may see Debbe Odea, MD or one of the following Advanced Practice Providers  on your designated Care Team:   Nicolasa Ducking, NP Eula Listen, PA-C Cadence Fransico Michael, PA-C Charlsie Quest, NP Carlos Levering, NP

## 2023-04-12 DIAGNOSIS — M7061 Trochanteric bursitis, right hip: Secondary | ICD-10-CM | POA: Diagnosis not present

## 2023-04-23 DIAGNOSIS — E890 Postprocedural hypothyroidism: Secondary | ICD-10-CM | POA: Diagnosis not present

## 2023-04-23 DIAGNOSIS — E6609 Other obesity due to excess calories: Secondary | ICD-10-CM | POA: Diagnosis not present

## 2023-04-23 DIAGNOSIS — E66812 Obesity, class 2: Secondary | ICD-10-CM | POA: Diagnosis not present

## 2023-04-23 DIAGNOSIS — E2089 Other specified hypoparathyroidism: Secondary | ICD-10-CM | POA: Diagnosis not present

## 2023-05-05 DIAGNOSIS — E2089 Other specified hypoparathyroidism: Secondary | ICD-10-CM | POA: Diagnosis not present

## 2023-06-18 ENCOUNTER — Other Ambulatory Visit: Payer: Self-pay | Admitting: Family Medicine

## 2023-06-18 DIAGNOSIS — E890 Postprocedural hypothyroidism: Secondary | ICD-10-CM

## 2023-06-23 ENCOUNTER — Encounter: Payer: Self-pay | Admitting: Family Medicine

## 2023-06-23 NOTE — Telephone Encounter (Signed)
 Please review and advise.   JM

## 2023-08-26 ENCOUNTER — Ambulatory Visit
Admission: RE | Admit: 2023-08-26 | Discharge: 2023-08-26 | Disposition: A | Discharge status: Home / Self Care | Source: Ambulatory Visit | Attending: Family Medicine | Admitting: Family Medicine

## 2023-08-26 ENCOUNTER — Encounter: Payer: Self-pay | Admitting: Family Medicine

## 2023-08-26 ENCOUNTER — Ambulatory Visit (INDEPENDENT_AMBULATORY_CARE_PROVIDER_SITE_OTHER): Admitting: Family Medicine

## 2023-08-26 VITALS — BP 116/74 | HR 84 | Ht 66.0 in | Wt 238.5 lb

## 2023-08-26 DIAGNOSIS — Z13220 Encounter for screening for lipoid disorders: Secondary | ICD-10-CM | POA: Diagnosis not present

## 2023-08-26 DIAGNOSIS — Z79899 Other long term (current) drug therapy: Secondary | ICD-10-CM | POA: Diagnosis not present

## 2023-08-26 DIAGNOSIS — Z8571 Personal history of Hodgkin lymphoma: Secondary | ICD-10-CM

## 2023-08-26 DIAGNOSIS — E890 Postprocedural hypothyroidism: Secondary | ICD-10-CM

## 2023-08-26 DIAGNOSIS — Z130 Encounter for screening for diseases of the blood and blood-forming organs and certain disorders involving the immune mechanism: Secondary | ICD-10-CM

## 2023-08-26 DIAGNOSIS — Z131 Encounter for screening for diabetes mellitus: Secondary | ICD-10-CM

## 2023-08-26 DIAGNOSIS — I700 Atherosclerosis of aorta: Secondary | ICD-10-CM | POA: Diagnosis not present

## 2023-08-26 DIAGNOSIS — R911 Solitary pulmonary nodule: Secondary | ICD-10-CM

## 2023-08-26 DIAGNOSIS — R221 Localized swelling, mass and lump, neck: Secondary | ICD-10-CM | POA: Diagnosis not present

## 2023-08-26 DIAGNOSIS — Z91018 Allergy to other foods: Secondary | ICD-10-CM

## 2023-08-26 DIAGNOSIS — E66812 Obesity, class 2: Secondary | ICD-10-CM

## 2023-08-26 DIAGNOSIS — Z136 Encounter for screening for cardiovascular disorders: Secondary | ICD-10-CM | POA: Diagnosis not present

## 2023-08-26 DIAGNOSIS — E041 Nontoxic single thyroid nodule: Secondary | ICD-10-CM | POA: Diagnosis not present

## 2023-08-26 DIAGNOSIS — K219 Gastro-esophageal reflux disease without esophagitis: Secondary | ICD-10-CM

## 2023-08-26 DIAGNOSIS — R918 Other nonspecific abnormal finding of lung field: Secondary | ICD-10-CM | POA: Diagnosis not present

## 2023-08-26 DIAGNOSIS — C8191 Hodgkin lymphoma, unspecified, lymph nodes of head, face, and neck: Secondary | ICD-10-CM | POA: Diagnosis not present

## 2023-08-26 DIAGNOSIS — Z6838 Body mass index (BMI) 38.0-38.9, adult: Secondary | ICD-10-CM

## 2023-08-26 DIAGNOSIS — Z1231 Encounter for screening mammogram for malignant neoplasm of breast: Secondary | ICD-10-CM

## 2023-08-26 MED ORDER — IOHEXOL 300 MG/ML  SOLN
75.0000 mL | Freq: Once | INTRAMUSCULAR | Status: AC | PRN
  Administered 2023-08-26: 75 mL via INTRAVENOUS

## 2023-08-26 NOTE — Assessment & Plan Note (Signed)
 Discussed about healthy eating habits, going to gym, exercising 150 minutes/week, using apps like Noom, my fitness pal, will readdress next visit with possibility of seeing a nutritionist, starting GLP's, informed patient to check with endocrinologist regarding GLP's.

## 2023-08-26 NOTE — Assessment & Plan Note (Signed)
 History of MSG allergies, uses over-the-counter Benadryl, keeps EpiPen  with her.

## 2023-08-26 NOTE — Assessment & Plan Note (Signed)
 Stable, takes Prilosec  over-the-counter 20 mg daily.

## 2023-08-26 NOTE — Assessment & Plan Note (Signed)
 Currently on Synthroid  replacement takes Synthroid  to 225 mcg total.  Due for TSH check.  She is going to meet her new endocrinologist as her old endocrinologist retired.  Refills based on her TSH levels.

## 2023-08-26 NOTE — Progress Notes (Signed)
 Established Patient Office Visit  Subjective   Patient ID: Jo [name], female    DOB: 09/21/1980  Age: 43 y.o. MRN: [id number]  Chief Complaint  Patient presents with   Medication Refill     Assessment & Plan:   Problem List Items Addressed This Visit       Respiratory   Lung nodule   Peripheral opacity in the inferolateral left lower lobe has slightly consolidative and nodular appearance, for example series 13, images 32-34. This is new from prior exam. 3 mm medial right lower lobe nodule series 13, image 25, has diminished from prior exam and is consistent with benign etiology. No pleural fluid. The included airways are patent      Relevant Orders   CT Chest W Contrast     Digestive   Gastro-esophageal reflux disease without esophagitis   Stable, takes Prilosec  over-the-counter 20 mg daily.        Endocrine   Post-surgical hypothyroidism - Primary   Currently on Synthroid  replacement takes Synthroid  to 225 mcg total.  Due for TSH check.  She is going to meet her new endocrinologist as her old endocrinologist retired.  Refills based on her TSH levels.      Relevant Orders   TSH   US  THYROID      Other   Allergy, food   History of MSG allergies, uses over-the-counter Benadryl, keeps EpiPen  with her.      Class 2 severe obesity due to excess calories with serious comorbidity and body mass index (BMI) of 38.0 to 38.9 in adult [facility])   Discussed about healthy eating habits, going to gym, exercising 150 minutes/week, using apps like [employer], my fitness pal, will readdress next visit with possibility of seeing a nutritionist, starting GLP's, informed patient to check with endocrinologist regarding GLP's.      Other Visit Diagnoses       History of Hodgkin's disease       Relevant Orders   CT Chest W Contrast     Breast cancer screening by mammogram       Relevant Orders   MM 3D SCREENING MAMMOGRAM BILATERAL BREAST     Diabetes mellitus screening        Relevant Orders   Hemoglobin A1c     Screening for iron  deficiency anemia       Relevant Orders   CBC with Differential     Encounter for lipid screening for cardiovascular disease       Relevant Orders   Lipid panel     Encounter for long-term current use of medication       Relevant Orders   Comprehensive Metabolic Panel (CMET)       Return in about 4 weeks (around 09/23/2023) for TOC/annual.   43 year old female with past medical history of Hodgkin's disease, hypothyroidism s/p thyroidectomy, hyperlipidemia, GERD presents to the clinic for medication management.  Patient is transferring care since Dr. Joshua retired.  Patient has no concerns today.  History of Hodgkins disease  2001 and relapse 2014   Had radiation, chemo and thyroid  was removed due to nodules.  She used to see Endocrinology who has retired recently. She is going to see new Endo in the same group.   CT 2024:  COMPARISON:  Chest CT 09/08/2019   FINDINGS: Vascular: No aortic atherosclerosis. The included aorta is normal in caliber.   Mediastinum/nodes: Stable anterior mediastinal calcifications typical of treated lymphadenopathy. No noncalcified adenopathy in the included mediastinum. Tiny hiatal hernia.  Lungs: Peripheral opacity in the inferolateral left lower lobe has slightly consolidative and nodular appearance, for example series 13, images 32-34. This is new from prior exam. 3 mm medial right lower lobe nodule series 13, image 25, has diminished from prior exam and is consistent with benign etiology. No pleural fluid. The included airways are patent.   Upper abdomen: No acute or unexpected findings.   Musculoskeletal: There are no acute or suspicious osseous abnormalities.   IMPRESSION: 1. Small peripheral opacity in the inferolateral left lower lobe has slightly consolidative and nodular appearance. This is new from prior exam. This may represent areas of atelectasis, although infection  comprehend similarly. Given history of Hodgkin's lymphoma, recommend chest CT follow-up in 3-6 months to assess for stability/resolution. 2. Tiny hiatal hernia. 3. A 3 mm medial right lower lobe nodule has diminished from prior exam and is consistent with benign etiology.     MSG allergy: Patient tries to be careful when she eats out.  Reports she uses over-the-counter Benadryl whenever she has allergic symptoms.  Patient never had anaphylactic reaction.  Usually her symptoms are hives, oropharyngeal swelling, upset stomach.  Patient was never evaluated by allergist.  Patient does carry EpiPen .  Reminded patient to check expiration of her EpiPen .  Patient verbalized understanding.         Review of Systems  All other systems reviewed and are negative.     Objective:     BP 116/74   Pulse 84   Ht 5' 6 (1.676 m)   Wt 238 lb 8 oz (108.2 kg)   LMP 08/10/2023 (Approximate)   SpO2 96%   BMI 38.49 kg/m  BP Readings from Last 3 Encounters:  08/26/23 116/74  01/29/23 112/72  12/18/22 101/60   Wt Readings from Last 3 Encounters:  08/26/23 238 lb 8 oz (108.2 kg)  01/29/23 223 lb 6.4 oz (101.3 kg)  12/03/22 231 lb 3.2 oz (104.9 kg)      Physical Exam Vitals and nursing note reviewed.  Constitutional:      Appearance: Normal appearance.  HENT:     Head: Normocephalic.     Right Ear: External ear normal.     Left Ear: External ear normal.  Eyes:     Conjunctiva/sclera: Conjunctivae normal.  Cardiovascular:     Rate and Rhythm: Normal rate.  Pulmonary:     Effort: Pulmonary effort is normal. No respiratory distress.  Abdominal:     Palpations: Abdomen is soft.  Musculoskeletal:        General: Normal range of motion.  Skin:    General: Skin is warm.  Neurological:     Mental Status: She is alert and oriented to person, place, and time.  Psychiatric:        Mood and Affect: Mood normal.      No results found for any visits on 08/26/23.  Last lipids Lab  Results  Component Value Date   CHOL 179 09/20/2022   HDL 65 09/20/2022   LDLCALC 85 09/20/2022   TRIG 144 09/20/2022   CHOLHDL 2.8 09/20/2022   Last hemoglobin A1c Lab Results  Component Value Date   HGBA1C 5.9 (H) 05/03/2022   Last thyroid  functions Lab Results  Component Value Date   TSH 0.941 09/20/2022   T4TOTAL 9.6 11/02/2021      The 10-year ASCVD risk score (Arnett DK, et al., 2019) is: 0.3%      Vinary K Sulema Braid, MD

## 2023-08-26 NOTE — Assessment & Plan Note (Signed)
 Peripheral opacity in the inferolateral left lower lobe has slightly consolidative and nodular appearance, for example series 13, images 32-34. This is new from prior exam. 3 mm medial right lower lobe nodule series 13, image 25, has diminished from prior exam and is consistent with benign etiology. No pleural fluid. The included airways are patent

## 2023-08-27 ENCOUNTER — Ambulatory Visit: Payer: Self-pay | Admitting: Family Medicine

## 2023-08-27 LAB — TSH: TSH: 0.537 u[IU]/mL (ref 0.450–4.500)

## 2023-08-27 LAB — CBC WITH DIFFERENTIAL/PLATELET
Basophils Absolute: 0.1 x10E3/uL (ref 0.0–0.2)
Basos: 1 %
EOS (ABSOLUTE): 0.2 x10E3/uL (ref 0.0–0.4)
Eos: 2 %
Hematocrit: 34 % (ref 34.0–46.6)
Hemoglobin: 10.2 g/dL — ABNORMAL LOW (ref 11.1–15.9)
Immature Grans (Abs): 0.1 x10E3/uL (ref 0.0–0.1)
Immature Granulocytes: 1 %
Lymphocytes Absolute: 2.4 x10E3/uL (ref 0.7–3.1)
Lymphs: 23 %
MCH: 21.4 pg — ABNORMAL LOW (ref 26.6–33.0)
MCHC: 30 g/dL — ABNORMAL LOW (ref 31.5–35.7)
MCV: 71 fL — ABNORMAL LOW (ref 79–97)
Monocytes Absolute: 0.9 x10E3/uL (ref 0.1–0.9)
Monocytes: 9 %
Neutrophils Absolute: 6.9 x10E3/uL (ref 1.4–7.0)
Neutrophils: 64 %
Platelets: 365 x10E3/uL (ref 150–450)
RBC: 4.76 x10E6/uL (ref 3.77–5.28)
RDW: 15.7 % — ABNORMAL HIGH (ref 11.7–15.4)
WBC: 10.6 x10E3/uL (ref 3.4–10.8)

## 2023-08-27 LAB — LIPID PANEL
Chol/HDL Ratio: 2.4 ratio (ref 0.0–4.4)
Cholesterol, Total: 169 mg/dL (ref 100–199)
HDL: 69 mg/dL (ref >39)
LDL Chol Calc (NIH): 79 mg/dL (ref 0–99)
Triglycerides: 118 mg/dL (ref 0–149)
VLDL Cholesterol Cal: 21 mg/dL (ref 5–40)

## 2023-08-27 LAB — COMPREHENSIVE METABOLIC PANEL WITH GFR
ALT: 15 IU/L (ref 0–32)
AST: 14 IU/L (ref 0–40)
Albumin: 4.2 g/dL (ref 3.9–4.9)
Alkaline Phosphatase: 61 IU/L (ref 44–121)
BUN/Creatinine Ratio: 19 (ref 9–23)
BUN: 14 mg/dL (ref 6–24)
Bilirubin Total: <0.2 mg/dL (ref 0.0–1.2)
CO2: 24 mmol/L (ref 20–29)
Calcium: 8.5 mg/dL — ABNORMAL LOW (ref 8.7–10.2)
Chloride: 97 mmol/L (ref 96–106)
Creatinine, Ser: 0.72 mg/dL (ref 0.57–1.00)
Globulin, Total: 2.4 g/dL (ref 1.5–4.5)
Glucose: 91 mg/dL (ref 70–99)
Potassium: 4.3 mmol/L (ref 3.5–5.2)
Sodium: 141 mmol/L (ref 134–144)
Total Protein: 6.6 g/dL (ref 6.0–8.5)
eGFR: 108 mL/min/1.73 (ref >59)

## 2023-08-27 LAB — HEMOGLOBIN A1C
Est. average glucose Bld gHb Est-mCnc: 120 mg/dL
Hgb A1c MFr Bld: 5.8 % — ABNORMAL HIGH (ref 4.8–5.6)

## 2023-09-29 ENCOUNTER — Ambulatory Visit (INDEPENDENT_AMBULATORY_CARE_PROVIDER_SITE_OTHER): Admitting: Family Medicine

## 2023-09-29 ENCOUNTER — Other Ambulatory Visit: Payer: Self-pay

## 2023-09-29 ENCOUNTER — Encounter: Payer: Self-pay | Admitting: Family Medicine

## 2023-09-29 VITALS — BP 118/70 | HR 98 | Ht 66.0 in | Wt 237.1 lb

## 2023-09-29 DIAGNOSIS — R1030 Lower abdominal pain, unspecified: Secondary | ICD-10-CM | POA: Diagnosis not present

## 2023-09-29 DIAGNOSIS — R112 Nausea with vomiting, unspecified: Secondary | ICD-10-CM

## 2023-09-29 MED ORDER — DICYCLOMINE HCL 10 MG PO CAPS
10.0000 mg | ORAL_CAPSULE | Freq: Three times a day (TID) | ORAL | 0 refills | Status: DC
Start: 2023-09-29 — End: 2023-12-03

## 2023-09-29 MED ORDER — ONDANSETRON 4 MG PO TBDP
4.0000 mg | ORAL_TABLET | Freq: Three times a day (TID) | ORAL | 0 refills | Status: AC | PRN
Start: 2023-09-29 — End: ?

## 2023-09-29 NOTE — Progress Notes (Signed)
   Established Patient Office Visit  Subjective   Patient ID: Jo [name], female    DOB: August 16, 1980  Age: 43 y.o. MRN: [id number]  Chief Complaint  Patient presents with   Abdominal Pain    Patient is having lower abdominal pain, described as stabbing, aching, gets nauseous with the pain, vomited once this Saturday afternoon, has the pains on and off during the day     Assessment & Plan:   Problem List Items Addressed This Visit   None Visit Diagnoses       Lower abdominal pain    -  Primary   Relevant Medications   dicyclomine  (BENTYL ) 10 MG capsule   Other Relevant Orders   Urinalysis, Routine w reflex microscopic   Comprehensive Metabolic Panel (CMET)   CBC with Differential   Lipase   CT ABDOMEN PELVIS WO CONTRAST     Nausea and vomiting, unspecified vomiting type       Relevant Medications   ondansetron  (ZOFRAN -ODT) 4 MG disintegrating tablet   Other Relevant Orders   CT ABDOMEN PELVIS WO CONTRAST     Unclear etiology. Ddx : IBS vs Diverticulitis vs Intrauterine pathology. Abdominal pain work up ordered. If no improvement will refer to GI pending CT results. Rx sent as above for symptomatic care.    Return in about 6 months (around 03/31/2024) for chronic follow up with PCP.   Abdominal Pain Patient complains of abdominal pain. The pain is described as cramping, and is 6/10 in intensity. The patient is experiencing suprapubic pain without radiation. Onset was a few months ago. Symptoms have been gradually worsening. Aggravating factors: none.  Alleviating factors: bowel movements. Associated symptoms: nausea and vomiting. The patient denies constipation, diarrhea, hematochezia, hematuria, and melena.       Review of Systems  All other systems reviewed and are negative.     Objective:     BP 118/70   Pulse 98   Ht 5' 6 (1.676 m)   Wt 237 lb 2 oz (107.6 kg)   LMP 09/10/2023 (Approximate)   SpO2 98%   BMI 38.27 kg/m     Physical Exam Vitals  and nursing note reviewed.  Constitutional:      Appearance: Normal appearance.  HENT:     Head: Normocephalic.     Right Ear: External ear normal.     Left Ear: External ear normal.  Eyes:     Conjunctiva/sclera: Conjunctivae normal.  Cardiovascular:     Rate and Rhythm: Normal rate.  Pulmonary:     Effort: Pulmonary effort is normal. No respiratory distress.  Abdominal:     Palpations: Abdomen is soft.  Musculoskeletal:        General: Normal range of motion.  Skin:    General: Skin is warm.  Neurological:     Mental Status: She is alert and oriented to person, place, and time.  Psychiatric:        Mood and Affect: Mood normal.      No results found for any visits on 09/29/23.     The 10-year ASCVD risk score (Arnett DK, et al., 2019) is: 0.2%      Vinary K Zineb Glade, MD

## 2023-09-30 ENCOUNTER — Ambulatory Visit
Admission: RE | Admit: 2023-09-30 | Discharge: 2023-09-30 | Disposition: A | Discharge status: Home / Self Care | Source: Ambulatory Visit | Attending: Family Medicine | Admitting: Family Medicine

## 2023-09-30 ENCOUNTER — Ambulatory Visit

## 2023-09-30 ENCOUNTER — Other Ambulatory Visit: Payer: Self-pay | Admitting: Family Medicine

## 2023-09-30 DIAGNOSIS — R112 Nausea with vomiting, unspecified: Secondary | ICD-10-CM | POA: Insufficient documentation

## 2023-09-30 DIAGNOSIS — R1030 Lower abdominal pain, unspecified: Secondary | ICD-10-CM | POA: Insufficient documentation

## 2023-09-30 DIAGNOSIS — R109 Unspecified abdominal pain: Secondary | ICD-10-CM | POA: Diagnosis not present

## 2023-09-30 DIAGNOSIS — D252 Subserosal leiomyoma of uterus: Secondary | ICD-10-CM | POA: Diagnosis not present

## 2023-09-30 DIAGNOSIS — N920 Excessive and frequent menstruation with regular cycle: Secondary | ICD-10-CM

## 2023-09-30 DIAGNOSIS — N3289 Other specified disorders of bladder: Secondary | ICD-10-CM | POA: Diagnosis not present

## 2023-09-30 LAB — CBC WITH DIFFERENTIAL/PLATELET
Basophils Absolute: 0.1 x10E3/uL (ref 0.0–0.2)
Basos: 1 %
EOS (ABSOLUTE): 0.1 x10E3/uL (ref 0.0–0.4)
Eos: 1 %
Hematocrit: 34.4 % (ref 34.0–46.6)
Hemoglobin: 10.3 g/dL — ABNORMAL LOW (ref 11.1–15.9)
Immature Grans (Abs): 0.1 x10E3/uL (ref 0.0–0.1)
Immature Granulocytes: 1 %
Lymphocytes Absolute: 2.3 x10E3/uL (ref 0.7–3.1)
Lymphs: 22 %
MCH: 21.1 pg — ABNORMAL LOW (ref 26.6–33.0)
MCHC: 29.9 g/dL — ABNORMAL LOW (ref 31.5–35.7)
MCV: 71 fL — ABNORMAL LOW (ref 79–97)
Monocytes Absolute: 0.8 x10E3/uL (ref 0.1–0.9)
Monocytes: 8 %
Neutrophils Absolute: 7.1 x10E3/uL — ABNORMAL HIGH (ref 1.4–7.0)
Neutrophils: 67 %
Platelets: 349 x10E3/uL (ref 150–450)
RBC: 4.88 x10E6/uL (ref 3.77–5.28)
RDW: 16.2 % — ABNORMAL HIGH (ref 11.7–15.4)
WBC: 10.5 x10E3/uL (ref 3.4–10.8)

## 2023-09-30 LAB — URINALYSIS, ROUTINE W REFLEX MICROSCOPIC
Bilirubin, UA: NEGATIVE
Glucose, UA: NEGATIVE
Ketones, UA: NEGATIVE
Leukocytes,UA: NEGATIVE
Nitrite, UA: NEGATIVE
Protein,UA: NEGATIVE
RBC, UA: NEGATIVE
Specific Gravity, UA: 1.023 (ref 1.005–1.030)
Urobilinogen, Ur: 0.2 mg/dL (ref 0.2–1.0)
pH, UA: 8 — ABNORMAL HIGH (ref 5.0–7.5)

## 2023-09-30 LAB — COMPREHENSIVE METABOLIC PANEL WITH GFR
ALT: 14 IU/L (ref 0–32)
AST: 13 IU/L (ref 0–40)
Albumin: 4.3 g/dL (ref 3.9–4.9)
Alkaline Phosphatase: 60 IU/L (ref 44–121)
BUN/Creatinine Ratio: 21 (ref 9–23)
BUN: 14 mg/dL (ref 6–24)
Bilirubin Total: <0.2 mg/dL (ref 0.0–1.2)
CO2: 22 mmol/L (ref 20–29)
Calcium: 8.8 mg/dL (ref 8.7–10.2)
Chloride: 98 mmol/L (ref 96–106)
Creatinine, Ser: 0.67 mg/dL (ref 0.57–1.00)
Globulin, Total: 2.5 g/dL (ref 1.5–4.5)
Glucose: 83 mg/dL (ref 70–99)
Potassium: 4.4 mmol/L (ref 3.5–5.2)
Sodium: 138 mmol/L (ref 134–144)
Total Protein: 6.8 g/dL (ref 6.0–8.5)
eGFR: 113 mL/min/1.73

## 2023-09-30 LAB — LIPASE: Lipase: 29 U/L (ref 14–72)

## 2023-10-01 ENCOUNTER — Encounter: Payer: Self-pay | Admitting: Licensed Practical Nurse

## 2023-10-02 DIAGNOSIS — D485 Neoplasm of uncertain behavior of skin: Secondary | ICD-10-CM | POA: Diagnosis not present

## 2023-10-02 DIAGNOSIS — L918 Other hypertrophic disorders of the skin: Secondary | ICD-10-CM | POA: Diagnosis not present

## 2023-10-02 DIAGNOSIS — L718 Other rosacea: Secondary | ICD-10-CM | POA: Diagnosis not present

## 2023-10-02 DIAGNOSIS — R208 Other disturbances of skin sensation: Secondary | ICD-10-CM | POA: Diagnosis not present

## 2023-10-09 DIAGNOSIS — M7061 Trochanteric bursitis, right hip: Secondary | ICD-10-CM | POA: Diagnosis not present

## 2023-10-14 ENCOUNTER — Encounter: Admitting: Licensed Practical Nurse

## 2023-10-16 ENCOUNTER — Ambulatory Visit
Admission: RE | Admit: 2023-10-16 | Discharge: 2023-10-16 | Disposition: A | Discharge status: Home / Self Care | Source: Ambulatory Visit | Attending: Family Medicine | Admitting: Family Medicine

## 2023-10-16 DIAGNOSIS — Z1231 Encounter for screening mammogram for malignant neoplasm of breast: Secondary | ICD-10-CM | POA: Insufficient documentation

## 2023-10-22 NOTE — Progress Notes (Signed)
 GYNECOLOGY PROGRESS NOTE: NEW PATIENT  Subjective:  PCP: Kotturi, Vinay K, MD  Patient ID: Jo [name], female    DOB: 1980-08-13, 43 y.o.   MRN: [id number]  HPI  Patient is a 43 y.o. G0P0 female who presents as referral from PCP Dr. Sol for uterine/adnexal findings on CT scan and HMB. Past 2-3 mos with pelvic pain that comes and goes, almost daily, will wake her at night sometimes, and occasionally causes vomiting. This pain is not constant, sometimes lasts several seconds and sometimes a few hours. Takes Aleve or aspirin , unsure if it helps, pain will resolve on it's own. CT abd/pelvis ordered by PCP showed possible fibroid and right adnexal mass needing further characterization.   Pt also reports hx of HMB for the past 27yrs, dx with PCOS many yrs ago, has cycles Q28d that last 5-6 days, heavy on days 2-4, needing to change her pad Q3-4hrs, sometimes leaks through the night. menorrhagia with regular menstrual cycle.   PMH of ovarian cyst perhaps in 2014, uterine polyp removal in 2012. Hx of lymphoma dx in 2001, reoccurred in 2014 and s/p chemo, radiation and surgery, now in remission and checked with Q61mo labs.   Period Cycle (Days): 28 Period Duration (Days): 6 Period Pattern: Regular Menstrual Flow: Heavy Menstrual Control: Maxi pad Menstrual Control Change Freq (Hours): 4 Dysmenorrhea: (!) Moderate Dysmenorrhea Symptoms: Cramping, Nausea, Diarrhea, Headache  Last pap: 01/05/20, NILM, HPV- Abnormal pap: denies Last mammogram: 10/16/23, normal  OB History  Gravida Para Term Preterm AB Living  0       SAB IAB Ectopic Multiple Live Births         Past Medical History:  Diagnosis Date   Anxiety    Constipation    Cramp of limb    Depression    GERD (gastroesophageal reflux disease)    Hives    Hodgkin disease (HCC) 2001 and 2014   chemo and rad tx in 2001 and 2014   Hodgkin's disease in remission    Hyperlipidemia    Insomnia    Lymphoma, Hodgkin's (HCC)     2001, 2014   Migraine    Obesity    Oligomenorrhea    Personal history of chemotherapy    Personal history of radiation therapy    Primary female infertility    Screening mammogram for high-risk patient    Seborrhea    Snoring    Thyroid  disease    TMJ (dislocation of temporomandibular joint)    Vitamin D  deficiency    Patient Active Problem List   Diagnosis Date Noted   Lung nodule 08/26/2023   Class 2 severe obesity due to excess calories with serious comorbidity and body mass index (BMI) of 38.0 to 38.9 in adult (HCC) 08/26/2023   Anxiety 08/31/2019   Multinodular goiter 08/31/2019   Iron  deficiency anemia 08/31/2019   Prediabetes 12/06/2016   Recurrent major depressive disorder, in partial remission (HCC) 12/05/2016   Tension type headache 04/27/2015   Hypocalcemia 04/17/2015   Hypoparathyroidism (HCC) 04/17/2015   GAD (generalized anxiety disorder) 10/18/2014   RLS (restless legs syndrome) 10/18/2014   Insomnia, persistent 10/18/2014   Migraine without aura and responsive to treatment 10/18/2014   Constipation 10/18/2014   Major depression, recurrent, chronic (HCC) 10/18/2014   Dysmenorrhea 10/18/2014   Female infertility 10/18/2014   Gastro-esophageal reflux disease without esophagitis 10/18/2014   Allergy, food 10/18/2014   Hodgkin lymphoma, unspecified, unspecified site (HCC) 10/18/2014   H/O thyroidectomy 10/18/2014   Metabolic  syndrome 10/18/2014   Low parathyroid  level after removal (HCC) 10/18/2014   Vitamin D  deficiency 10/18/2014   Hive 10/18/2014   Dermatitis seborrheica 10/18/2014   Arthralgia of temporomandibular joint 10/18/2014   Post-surgical hypothyroidism 10/13/2012   Past Surgical History:  Procedure Laterality Date   lymphonodi biopsy     PORT-A-CATH REMOVAL  2004   PORTACATH PLACEMENT  11-24-12   THYROID  SURGERY     THYROIDECTOMY  10-13-12   uterine poly removal Left 08/2010   [facility]   Family History  Problem Relation Age of Onset    Hypertension Mother    Liver disease Mother    Cancer Mother        breast cancer, cervical cancer   Breast cancer Mother 37   Diabetes Father    Heart disease Father    CAD Father    Sudden Cardiac Death Father    Coronary artery disease Father    Heart disease Brother    Diabetes Brother    Breast cancer Maternal Aunt        late years   Breast cancer Paternal Aunt        >50   Heart attack Maternal Grandmother    CAD Maternal Grandmother    Parkinson's disease Maternal Grandmother    Coronary artery disease Maternal Grandmother    Heart attack Maternal Grandfather    Parkinson's disease Maternal Grandfather    Coronary artery disease Maternal Grandfather    Heart attack Paternal Grandmother    CAD Paternal Grandmother    Coronary artery disease Paternal Grandmother    Diabetes Paternal Grandmother    Heart attack Paternal Grandfather    Coronary artery disease Paternal Grandfather    Diabetes Paternal Grandfather    Breast cancer Cousin    Asthma Cousin        55s   Social History   Socioeconomic History   Marital status: Married    Spouse name: Not on file   Number of children: Not on file   Years of education: Not on file   Highest education level: Some college, no degree  Occupational History   Occupation: billing     Comment: [employer] - [place]  Tobacco Use   Smoking status: Former    Current packs/day: 0.00    Types: Cigarettes    Start date: 07/27/2009    Quit date: 07/27/2009    Years since quitting: 14.2   Smokeless tobacco: Never  Vaping Use   Vaping status: Never Used  Substance and Sexual Activity   Alcohol use: Yes    Alcohol/week: 0.0 standard drinks of alcohol    Comment: rarely   Drug use: No   Sexual activity: Yes    Birth control/protection: None  Other Topics Concern   Not on file  Social History Narrative   Married, has fertility problems   Social Drivers of Corporate investment banker Strain: Low Risk  (08/25/2023)    Overall Financial Resource Strain (CARDIA)    Difficulty of Paying Living Expenses: Not hard at all  Food Insecurity: No Food Insecurity (08/25/2023)   Hunger Vital Sign    Worried About Running Out of Food in the Last Year: Never true    Ran Out of Food in the Last Year: Never true  Transportation Needs: No Transportation Needs (08/25/2023)   PRAPARE - Administrator, Civil Service (Medical): No    Lack of Transportation (Non-Medical): No  Physical Activity: Inactive (08/25/2023)   Exercise  Vital Sign    Days of Exercise per Week: 0 days    Minutes of Exercise per Session: Not on file  Stress: No Stress Concern Present (08/25/2023)   [employer] of Occupational Health - Occupational Stress Questionnaire    Feeling of Stress: Not at all  Social Connections: Moderately Integrated (08/25/2023)   Social Connection and Isolation Panel    Frequency of Communication with Friends and Family: More than three times a week    Frequency of Social Gatherings with Friends and Family: Once a week    Attends Religious Services: More than 4 times per year    Active Member of [employer] or Organizations: No    Attends Banker Meetings: Not on file    Marital Status: Married  Catering manager Violence: Not At Risk (08/26/2023)   Humiliation, Afraid, Rape, and Kick questionnaire    Fear of Current or Ex-Partner: No    Emotionally Abused: No    Physically Abused: No    Sexually Abused: No   Current Outpatient Medications on File Prior to Visit  Medication Sig Dispense Refill   Ascorbic Acid (VITAMIN C PO) Take by mouth.     aspirin  EC 81 MG tablet Take 1 tablet (81 mg total) by mouth daily. Swallow whole.     calcitRIOL  (ROCALTROL ) 0.5 MCG capsule Take 2 capsules (1 mcg total) by mouth 2 (two) times daily. 120 capsule 1   Calcium  Citrate 333 MG TABS      EPINEPHrine  0.3 mg/0.3 mL IJ SOAJ injection Inject 0.3 mg into the muscle as needed for anaphylaxis. 1 each 1    fexofenadine (ALLEGRA) 180 MG tablet Take by mouth.     Iron , Ferrous Sulfate , 325 (65 Fe) MG TABS Take 325 mg by mouth daily. 30 tablet 5   levothyroxine  (SYNTHROID ) 200 MCG tablet TAKE (1) TABLET BY MOUTH EVERY MORNING BEFORE BREAKFAST 90 tablet 1   levothyroxine  (SYNTHROID ) 25 MCG tablet TAKE (1) TABLET BY MOUTH EVERY DAY 90 tablet 1   Multiple Vitamin (MULTIVITAMIN ADULT PO) Take by mouth.     omeprazole  (PRILOSEC  OTC) 20 MG tablet Take by mouth.     atorvastatin  (LIPITOR) 20 MG tablet Take 1 tablet (20 mg total) by mouth daily. (Patient not taking: Reported on 10/28/2023) 90 tablet 3   dicyclomine  (BENTYL ) 10 MG capsule Take 1 capsule (10 mg total) by mouth 4 (four) times daily -  before meals and at bedtime. 120 capsule 0   hydrocortisone cream 0.5 %  (Patient not taking: Reported on 10/28/2023)     ondansetron  (ZOFRAN -ODT) 4 MG disintegrating tablet Take 1 tablet (4 mg total) by mouth every 8 (eight) hours as needed for nausea or vomiting. 20 tablet 0   No current facility-administered medications on file prior to visit.   Allergies  Allergen Reactions   Silicone Dermatitis    silicones   Tape Rash    Uncoded Allergy. Allergen: Msg   Barium-Containing Compounds Hives    Oral contrast   Monosodium Glutamate     Unknown.   Other Rash and Other (See Comments)    Contrast dye Uncoded Allergy. Allergen: Msg Contrast dye  Pt states she has no Iodine or Omni IV contrast reaction. She is only allergic to Barium Oral contrast. ASW 09/08/19     Review of Systems Pertinent items are noted in HPI.   Objective:   Blood pressure (!) 140/75, pulse (!) 101, height 5' 6 (1.676 m), weight 233 lb (105.7 kg), last menstrual  period 10/03/2023. Body mass index is 37.61 kg/m.  General appearance: alert, cooperative, no distress, and moderately obese Abdomen: soft, non-tender; bowel sounds normal; no masses,  no organomegaly Pelvic: cervix normal in appearance, exam obscured by obesity, external  genitalia normal, no adnexal masses or tenderness, no cervical motion tenderness, rectovaginal septum normal, uterus normal size, shape, and consistency, and vagina normal without discharge Extremities: extremities normal, atraumatic, no cyanosis or edema Neurologic: Grossly normal  CT Scan 09/30/23 Reproductive: Not well evaluated in the CT scan exam. However, having said that, note is made of slightly bulky and lobulated uterus. There is an ill-defined approximately 2.8 x 3.4 cm slightly hypoattenuating area along the right side of the uterine body, which was present on the prior study as well and favored to represent a subserosal/intramural leiomyoma. There is a well-circumscribed 5.2 x 5.8 x 6.0 Cm hypoattenuating structure in the right adnexa. Internal CT attenuation is 17-18 Hounsfield units. This is incompletely characterized on the current exam but favored right ovarian in etiology and may represent ovarian cyst. No discrete mural nodularity seen on this exam. Correlate clinically to determine the need for further characterization of this lesion with pelvic ultrasound. Unremarkable left ovary.  Assessment/Plan:   1. Pelvic pain   2. Menorrhagia with regular cycle     43 y.o. G0P0 with 2-3 mos of pelvic pain, and 40yrs of HMB, CT scan with PCP showing possible fibroid and right adnexal mass. We discussed need for pelvic US  to further characterize. Offered options to temporarily help with HMB and pt would defers at this time. Continue symptomatic management of pelvic pain while awaiting US  and report. Follow up 1-2 wks after US  to discuss results and treatment options.    Estil Mangle, DO [facility]

## 2023-10-28 ENCOUNTER — Ambulatory Visit (INDEPENDENT_AMBULATORY_CARE_PROVIDER_SITE_OTHER): Admitting: Obstetrics

## 2023-10-28 ENCOUNTER — Encounter: Payer: Self-pay | Admitting: Obstetrics

## 2023-10-28 VITALS — BP 140/75 | HR 101 | Ht 66.0 in | Wt 233.0 lb

## 2023-10-28 DIAGNOSIS — R102 Pelvic and perineal pain: Secondary | ICD-10-CM | POA: Diagnosis not present

## 2023-10-28 DIAGNOSIS — N920 Excessive and frequent menstruation with regular cycle: Secondary | ICD-10-CM | POA: Diagnosis not present

## 2023-10-28 DIAGNOSIS — Z8742 Personal history of other diseases of the female genital tract: Secondary | ICD-10-CM | POA: Diagnosis not present

## 2023-10-29 ENCOUNTER — Encounter: Payer: Self-pay | Admitting: Hematology and Oncology

## 2023-10-29 ENCOUNTER — Ambulatory Visit (INDEPENDENT_AMBULATORY_CARE_PROVIDER_SITE_OTHER)

## 2023-10-29 DIAGNOSIS — R102 Pelvic and perineal pain: Secondary | ICD-10-CM | POA: Diagnosis not present

## 2023-10-29 DIAGNOSIS — Z8742 Personal history of other diseases of the female genital tract: Secondary | ICD-10-CM | POA: Diagnosis not present

## 2023-10-29 DIAGNOSIS — N920 Excessive and frequent menstruation with regular cycle: Secondary | ICD-10-CM

## 2023-11-12 ENCOUNTER — Ambulatory Visit: Admitting: Obstetrics

## 2023-11-17 ENCOUNTER — Ambulatory Visit: Payer: Self-pay | Admitting: Obstetrics

## 2023-11-28 NOTE — Patient Instructions (Signed)
 Fibroids in the Uterus: What to Know  Fibroids are growths (tumors) in the uterus. Fibroids are not cancer. Most people with this condition do not need treatment. Sometimes, fibroids can make it hard to get pregnant. If this happens, you may need surgery to take out the fibroids. What are the causes? The cause of this condition is not known. What increases the risk? You're in your 30s or 40s and have not stopped having a period. You have family members who have had fibroids. You're of African American descent. You started your period at age 3 or younger. You've not given birth. You're overweight or you're obese. You eat a diet low in fruits, vegetables, and vitamin D . What are the signs or symptoms? Heavy bleeding during your period. Bleeding between periods. Pain in the area between your hips (pelvis). Pain during sex. Needing to pee right away or peeing more often than usual. Not being able to have children. Not being able to stay pregnant (miscarriage). Many people do not have symptoms. How is this treated? Treatment may include: Medicines to help with pain, such as aspirin  or ibuprofen. Hormone treatment. This may be given as a pill, in a shot, or with a type of birth control device called an IUD. Surgery. This may be done to: Take out the fibroids. This may be done if you want to become pregnant. Take out the uterus. Stop the blood flow to the fibroids. Procedures to shrink the fibroids. This may be done with: Heat and radio energy. Ultrasound waves. Follow these instructions at home: Medicines Take your medicines only as told. You can lose a lot of iron  because of heavy bleeding from your period. Ask your doctor if you should: Take iron  pills. Eat more foods that have iron  in them, such as dark green, leafy vegetables. Managing pain  Put heat on your back or belly as told. Use the heat source that your doctor recommends, such as a moist heat pack or a heating pad. Do  this as often as told. Put a towel between your skin and the heat source. Leave the heat on for 20-30 minutes. If your skin turns red, take off the heat right away to prevent burns. The risk of burns is higher if you can't feel pain, heat, or cold. General instructions Tell your doctor about any changes in your period, such as: Heavy bleeding that needs a change of tampons or pads more than normal. A change in how many days your period lasts. A change in symptoms that come with your period. This might be cramps in your belly or pain in your back. Keep all follow-up visits. Your doctor needs to check your fibroids for any changes. Contact a doctor if: You have pain that does not get better with medicine or heat. This may include pain or cramps in: The area between your hip bones. Your back. Your belly. You have new bleeding between your periods. You have more bleeding during or between your periods. You feel very tired or weak. You feel dizzy. Get help right away if: You faint. You have pain in the area between your hip bones that gets worse. You have bleeding that soaks a tampon or pad in 30 minutes or less. This information is not intended to replace advice given to you by your health care provider. Make sure you discuss any questions you have with your health care provider. Document Revised: 07/30/2022 Document Reviewed: 07/30/2022 Elsevier Patient Education  2024 [employer]Polycystic Ovary Syndrome  Polycystic ovarian syndrome (PCOS) is a common hormonal disorder among women of reproductive age. In most women with PCOS, small fluid-filled sacs (cysts) grow on the ovaries. PCOS can cause problems with menstrual periods and make it hard to get and stay pregnant. If this condition is not treated, it can lead to serious health problems, such as diabetes and heart disease. What are the causes? The cause of this condition is not known. It may be due to certain factors, such  as: Irregular menstrual cycle. High levels of certain hormones. Problems with the hormone that helps to control blood sugar (insulin ). Certain genes. What increases the risk? You are more likely to develop this condition if you: Have a family history of PCOS or type 2 diabetes. Are overweight, eat unhealthy foods, and are not active. These factors may cause problems with blood sugar control, which can contribute to PCOS or PCOS symptoms. What are the signs or symptoms? Symptoms of this condition include: Ovarian cysts and sometimes pelvic pain. Menstrual periods that are not regular or are too heavy. Inability to get or stay pregnant. Increased growth of hair on the face, chest, stomach, back, thumbs, thighs, or toes. Acne or oily skin. Acne may develop during adulthood, and it may not get better with treatment. Weight gain or obesity. Patches of thickened and dark brown or black skin on the neck, arms, breasts, or thighs. How is this diagnosed? This condition is diagnosed based on: Your medical history. A physical exam that includes a pelvic exam. Your health care provider may look for areas of increased hair growth on your skin. Tests, such as: An ultrasound to check the ovaries for cysts and to view the lining of the uterus. Blood tests to check levels of sugar (glucose), female hormone (testosterone), and female hormones (estrogen and progesterone). How is this treated? There is no cure for this condition, but treatment can help to manage symptoms and prevent more health problems from developing. Treatment varies depending on your symptoms and if you want to have a baby or if you need birth control. Treatment may include: Making nutrition and lifestyle changes. Taking the progesterone hormone to start a menstrual period. Taking birth control pills to help you have regular menstrual periods. Taking medicines such as: Medicines to make you ovulate, if you want to get  pregnant. Medicine to reduce extra hair growth. Having surgery in severe cases. This may involve making small holes in one or both of your ovaries. This decreases the amount of testosterone that your body makes. Follow these instructions at home: Take over-the-counter and prescription medicines only as told by your health care provider. Follow a healthy meal plan that includes lean proteins, complex carbohydrates, fresh fruits and vegetables, low-fat dairy products, healthy fats, and fiber. If you are overweight, lose weight as told by your health care provider. Your health care provider can determine how much weight loss is best for you and can help you lose weight safely. Keep all follow-up visits. This is important. Contact a health care provider if: Your symptoms do not get better with medicine. Your symptoms get worse or you develop new symptoms. Summary Polycystic ovarian syndrome (PCOS) is a common hormonal disorder among women of reproductive age. PCOS can cause problems with menstrual periods and make it hard to get and stay pregnant. If this condition is not treated, it can lead to serious health problems, such as diabetes and heart disease. There is no cure for this condition, but treatment can help to  manage symptoms and prevent more health problems from developing. This information is not intended to replace advice given to you by your health care provider. Make sure you discuss any questions you have with your health care provider. Document Revised: 12/16/2022 Document Reviewed: 12/16/2022 Elsevier Patient Education  2024 [employer].

## 2023-11-28 NOTE — Progress Notes (Unsigned)
 GYNECOLOGY PROGRESS NOTE  Subjective:  PCP: Kotturi, Vinay K, MD  Patient ID: Jo [name], female    DOB: 11-08-1980, 43 y.o.   MRN: [id number]  HPI  Patient is a 43 y.o. G0P0 female who presents with her partner to follow up on AUB/pelvic pain, and to discuss ultrasound 10/29/23 for heavy menstrual bleeding, pelvic pain and a history of PCOS.  Treatment was deferred at last visit until results of ultrasound.  Continues to have heavy painful period this month, LMP 11/25/23.  Hx of lymphoma dx in 2001, reoccurred in 2014 and s/p chemo, radiation and surgery, now in remission and checked with [facility]mo labs. She was told at time of initial chemo that afterward, may not be able to have children. She did not harvest eggs. Is now stating that she is still holding onto hope that she may be able to have a baby, is not ready to close the door on childbearing. Has not been evaluated or seen by fertility.   The following portions of the patient's history were reviewed and updated as appropriate: allergies, current medications, past family history, past medical history, past social history, past surgical history, and problem list.  Review of Systems Pertinent items are noted in HPI.   Objective:   Blood pressure 133/70, pulse 97, weight 230 lb 3.2 oz (104.4 kg), last menstrual period 11/25/2023. Body mass index is 37.16 kg/m.  General appearance: alert and cooperative Abdomen: soft, non-tender; bowel sounds normal; no masses,  no organomegaly Pelvic: deferred Extremities: extremities normal, atraumatic, no cyanosis or edema Neurologic: Grossly normal  U/S 10/29/23 Findings:  The uterus is anteverted and measures 8.1 x 4.7 x 4.9 cm with a uterine volume of 97.56 ml. Echo texture is heterogenous with evidence of focal masses. Within the uterus are multiple suspected fibroids. The largest four measuring:   Fibroid 1: 2.9 x 2.2 x 2.8 cm (Intramural). Fibroid 2: 1.4 x 1.1 x 1.6 cm  (Intramural). Fibroid 3: 1.7 x 1.3 x 1.7 cm (Intramural). Fibroid 4: 1.3 x .6 x 1.4 cm (Intramural).   The Endometrium measures 3.2 mm.   Right Ovary measures 4.0 x 2.4 x 2.6 cm. It is normal in appearance for PCOS. Left Ovary measures 4.5 x 2.9 x 2.2 cm. It is normal in appearance for PCOS. There is also a Cystic structure seen and measured: 2.1 x 1.7 x 2.2 cm. Survey of the adnexa demonstrates no adnexal masses. There is no free fluid in the cul de sac.   Impression: 1. Fibroid uterus seen. 2. Both ovaries appear larger in size consistent with H/O PCOS. 3. Left Ovarian cyst noted.  Assessment/Plan:   1. Abnormal uterine bleeding (AUB)   2. Intramural leiomyoma of uterus   3. PCOS (polycystic ovarian syndrome)   4. Encounter for fertility testing   5. Hx of Hodgkin's lymphoma    Discussed management options for HMB due to fibroids including expectant, NSAIDs, tranexamic acid (Lysteda), oral progesterone (Provera, norethindrone, megace), Depo Provera, Levonorgestrel containing IUD, endometrial ablation (Novasure) or hysterectomy as definitive surgical management.  Discussed risks and benefits of each method. Printed patient education handouts were given to the patient to review at home. During discussion, became clear that patient needs to explore her childbearing options first before she can make a decision on treatment. She is amenable to Lysteda for now and REI referral placed so she can discuss options. Pt requesting to come back in 6 wks to discuss treatment further after she's had time to  contemplate.  -Rx for Lysteda -- SE and dosing reviewed -Referral to REI ordered -Bleeding precautions reviewed.   Follow up 6 wks for further treatment planning.    Total time was 35 minutes. That includes chart review before the visit, the actual patient visit, and time spent on documentation after the visit. Time excludes procedures, if any.    Estil Mangle, DO [facility]

## 2023-12-03 ENCOUNTER — Encounter: Payer: Self-pay | Admitting: Obstetrics

## 2023-12-03 ENCOUNTER — Ambulatory Visit: Admitting: Obstetrics

## 2023-12-03 VITALS — BP 133/70 | HR 97 | Wt 230.2 lb

## 2023-12-03 DIAGNOSIS — D251 Intramural leiomyoma of uterus: Secondary | ICD-10-CM

## 2023-12-03 DIAGNOSIS — E282 Polycystic ovarian syndrome: Secondary | ICD-10-CM

## 2023-12-03 DIAGNOSIS — Z8571 Personal history of Hodgkin lymphoma: Secondary | ICD-10-CM

## 2023-12-03 DIAGNOSIS — N939 Abnormal uterine and vaginal bleeding, unspecified: Secondary | ICD-10-CM | POA: Diagnosis not present

## 2023-12-03 DIAGNOSIS — Z3141 Encounter for fertility testing: Secondary | ICD-10-CM

## 2023-12-03 MED ORDER — TRANEXAMIC ACID 650 MG PO TABS: 1300.0000 mg | ORAL_TABLET | Freq: Three times a day (TID) | ORAL | 0 refills | Status: AC

## 2023-12-04 DIAGNOSIS — E282 Polycystic ovarian syndrome: Secondary | ICD-10-CM | POA: Insufficient documentation

## 2023-12-04 DIAGNOSIS — D251 Intramural leiomyoma of uterus: Secondary | ICD-10-CM | POA: Insufficient documentation

## 2023-12-04 DIAGNOSIS — N939 Abnormal uterine and vaginal bleeding, unspecified: Secondary | ICD-10-CM | POA: Insufficient documentation

## 2023-12-25 ENCOUNTER — Other Ambulatory Visit: Payer: Self-pay

## 2023-12-25 DIAGNOSIS — E890 Postprocedural hypothyroidism: Secondary | ICD-10-CM

## 2023-12-25 MED ORDER — LEVOTHYROXINE SODIUM 200 MCG PO TABS: ORAL_TABLET | ORAL | 1 refills | Status: AC

## 2023-12-29 ENCOUNTER — Encounter: Admitting: Family Medicine

## 2024-01-02 ENCOUNTER — Other Ambulatory Visit: Payer: Self-pay

## 2024-01-02 DIAGNOSIS — E890 Postprocedural hypothyroidism: Secondary | ICD-10-CM

## 2024-01-02 MED ORDER — LEVOTHYROXINE SODIUM 25 MCG PO TABS: 25.0000 ug | ORAL_TABLET | Freq: Every day | ORAL | 1 refills | Status: AC

## 2024-01-06 NOTE — Progress Notes (Deleted)
    GYNECOLOGY PROGRESS NOTE  Subjective:  PCP: Kotturi, Vinay K, MD  Patient ID: Jo [name], female    DOB: [birth year], 43 y.o.   MRN: [id number]  HPI  Patient is a 43 y.o. G0P0 female who presents for follow up of uterine fibroids causing heavy menstrual bleeding and pelvic pain.  Patient was seen 12/03/23 to discuss fibroids found on u/s 10/29/23.  At that appointment, patient was given management options including expectant, NSAIDs, tranexamic acid  (Lysteda ), oral progesterone (Provera, norethindrone, megace), Depo Provera, Levonorgestrel containing IUD, endometrial ablation (Novasure) or hysterectomy as definitive surgical management. Patient is unsure whether she would still like to conceive.  Referral made to REI and patient requested to come back in 6 weeks after considering her options.  {Common ambulatory SmartLinks:19316}  Review of Systems {ros; complete:30496}   Objective:   There were no vitals taken for this visit. There is no height or weight on file to calculate BMI.  General appearance: {general exam:16600} Abdomen: {abdominal exam:16834} Pelvic: {pelvic exam:16852::cervix normal in appearance,external genitalia normal,no adnexal masses or tenderness,no cervical motion tenderness,rectovaginal septum normal,uterus normal size, shape, and consistency,vagina normal without discharge} Extremities: {extremity exam:5109} Neurologic: {neuro exam:17854}  U/S 10/29/23 Findings:  The uterus is anteverted and measures 8.1 x 4.7 x 4.9 cm with a uterine volume of 97.56 ml. Echo texture is heterogenous with evidence of focal masses. Within the uterus are multiple suspected fibroids. The largest four measuring:   Fibroid 1: 2.9 x 2.2 x 2.8 cm (Intramural). Fibroid 2: 1.4 x 1.1 x 1.6 cm (Intramural). Fibroid 3: 1.7 x 1.3 x 1.7 cm (Intramural). Fibroid 4: 1.3 x .6 x 1.4 cm (Intramural).   The Endometrium measures 3.2 mm.   Right Ovary measures 4.0 x 2.4 x 2.6 cm.  It is normal in appearance for PCOS. Left Ovary measures 4.5 x 2.9 x 2.2 cm. It is normal in appearance for PCOS. There is also a Cystic structure seen and measured: 2.1 x 1.7 x 2.2 cm. Survey of the adnexa demonstrates no adnexal masses. There is no free fluid in the cul de sac.   Impression: 1. Fibroid uterus seen. 2. Both ovaries appear larger in size consistent with H/O PCOS. 3. Left Ovarian cyst noted. Assessment/Plan:   No diagnosis found.   There are no diagnoses linked to this encounter.     Estil Mangle, DO [facility]

## 2024-01-14 ENCOUNTER — Ambulatory Visit: Admitting: Obstetrics

## 2024-01-21 DIAGNOSIS — N978 Female infertility of other origin: Secondary | ICD-10-CM | POA: Diagnosis not present

## 2024-01-21 DIAGNOSIS — Z319 Encounter for procreative management, unspecified: Secondary | ICD-10-CM | POA: Diagnosis not present

## 2024-01-29 DIAGNOSIS — L719 Rosacea, unspecified: Secondary | ICD-10-CM | POA: Diagnosis not present

## 2024-01-29 DIAGNOSIS — Z3169 Encounter for other general counseling and advice on procreation: Secondary | ICD-10-CM | POA: Diagnosis not present

## 2024-04-02 ENCOUNTER — Ambulatory Visit: Admitting: Family Medicine
